# Patient Record
Sex: Female | Born: 1935
Health system: Southern US, Community
[De-identification: ages and names within clinical notes are randomized; demographics above are authoritative.]

## PROBLEM LIST (undated history)

## (undated) DIAGNOSIS — I714 Abdominal aortic aneurysm, without rupture, unspecified: Secondary | ICD-10-CM

## (undated) DIAGNOSIS — F419 Anxiety disorder, unspecified: Secondary | ICD-10-CM

## (undated) DIAGNOSIS — I5022 Chronic systolic (congestive) heart failure: Secondary | ICD-10-CM

## (undated) DIAGNOSIS — M1990 Unspecified osteoarthritis, unspecified site: Secondary | ICD-10-CM

## (undated) DIAGNOSIS — N183 Chronic kidney disease, stage 3 unspecified: Secondary | ICD-10-CM

## (undated) DIAGNOSIS — E079 Disorder of thyroid, unspecified: Secondary | ICD-10-CM

## (undated) DIAGNOSIS — I2510 Atherosclerotic heart disease of native coronary artery without angina pectoris: Secondary | ICD-10-CM

## (undated) DIAGNOSIS — K5792 Diverticulitis of intestine, part unspecified, without perforation or abscess without bleeding: Secondary | ICD-10-CM

## (undated) DIAGNOSIS — F32A Depression, unspecified: Secondary | ICD-10-CM

## (undated) DIAGNOSIS — I219 Acute myocardial infarction, unspecified: Secondary | ICD-10-CM

## (undated) DIAGNOSIS — K922 Gastrointestinal hemorrhage, unspecified: Secondary | ICD-10-CM

## (undated) DIAGNOSIS — E785 Hyperlipidemia, unspecified: Secondary | ICD-10-CM

## (undated) DIAGNOSIS — I10 Essential (primary) hypertension: Secondary | ICD-10-CM

## (undated) DIAGNOSIS — F329 Major depressive disorder, single episode, unspecified: Secondary | ICD-10-CM

## (undated) DIAGNOSIS — K219 Gastro-esophageal reflux disease without esophagitis: Secondary | ICD-10-CM

## (undated) DIAGNOSIS — Z8669 Personal history of other diseases of the nervous system and sense organs: Secondary | ICD-10-CM

## (undated) DIAGNOSIS — Z87440 Personal history of urinary (tract) infections: Secondary | ICD-10-CM

## (undated) DIAGNOSIS — N200 Calculus of kidney: Secondary | ICD-10-CM

## (undated) DIAGNOSIS — F0390 Unspecified dementia without behavioral disturbance: Secondary | ICD-10-CM

## (undated) HISTORY — DX: Chronic kidney disease, stage 3 unspecified: N18.30

## (undated) HISTORY — DX: Atherosclerotic heart disease of native coronary artery without angina pectoris: I25.10

## (undated) HISTORY — DX: Gastrointestinal hemorrhage, unspecified: K92.2

## (undated) HISTORY — DX: Gastro-esophageal reflux disease without esophagitis: K21.9

## (undated) HISTORY — PX: PARATHYROIDECTOMY: SHX19

## (undated) HISTORY — DX: Disorder of thyroid, unspecified: E07.9

## (undated) HISTORY — DX: Abdominal aortic aneurysm, without rupture: I71.4

## (undated) HISTORY — DX: Anxiety disorder, unspecified: F41.9

## (undated) HISTORY — DX: Personal history of urinary (tract) infections: Z87.440

## (undated) HISTORY — DX: Abdominal aortic aneurysm, without rupture, unspecified: I71.40

## (undated) HISTORY — DX: Unspecified osteoarthritis, unspecified site: M19.90

## (undated) HISTORY — DX: Major depressive disorder, single episode, unspecified: F32.9

## (undated) HISTORY — PX: ABDOMINAL AORTIC ANEURYSM REPAIR: SUR1152

## (undated) HISTORY — DX: Chronic systolic (congestive) heart failure: I50.22

## (undated) HISTORY — PX: CHOLECYSTECTOMY: SHX55

## (undated) HISTORY — DX: Hyperlipidemia, unspecified: E78.5

## (undated) HISTORY — PX: ABDOMINAL AORTIC ENDOVASCULAR STENT GRAFT: SHX5707

## (undated) HISTORY — DX: Depression, unspecified: F32.A

## (undated) HISTORY — DX: Acute myocardial infarction, unspecified: I21.9

## (undated) HISTORY — DX: Chronic kidney disease, stage 3 (moderate): N18.3

## (undated) HISTORY — DX: Calculus of kidney: N20.0

## (undated) HISTORY — DX: Essential (primary) hypertension: I10

## (undated) HISTORY — PX: THYROIDECTOMY: SHX17

## (undated) HISTORY — PX: CARDIAC CATHETERIZATION: SHX172

## (undated) HISTORY — DX: Personal history of other diseases of the nervous system and sense organs: Z86.69

## (undated) HISTORY — DX: Diverticulitis of intestine, part unspecified, without perforation or abscess without bleeding: K57.92

---

## 2004-10-04 ENCOUNTER — Ambulatory Visit: Payer: Self-pay | Admitting: Gastroenterology

## 2005-07-24 ENCOUNTER — Ambulatory Visit: Payer: Self-pay | Admitting: Family Medicine

## 2006-07-25 ENCOUNTER — Ambulatory Visit: Payer: Self-pay | Admitting: Family Medicine

## 2006-11-21 ENCOUNTER — Ambulatory Visit: Payer: Self-pay

## 2007-07-31 ENCOUNTER — Ambulatory Visit: Payer: Self-pay | Admitting: Family Medicine

## 2008-01-03 ENCOUNTER — Emergency Department: Payer: Self-pay | Admitting: Emergency Medicine

## 2008-08-26 ENCOUNTER — Emergency Department: Payer: Self-pay

## 2008-09-07 ENCOUNTER — Ambulatory Visit: Payer: Self-pay | Admitting: Family Medicine

## 2008-10-01 HISTORY — PX: LAPAROSCOPIC PARTIAL NEPHRECTOMY: SUR782

## 2008-10-01 HISTORY — PX: NEPHRECTOMY: SHX65

## 2008-11-17 ENCOUNTER — Ambulatory Visit: Payer: Self-pay | Admitting: Vascular Surgery

## 2008-11-24 ENCOUNTER — Inpatient Hospital Stay: Payer: Self-pay | Admitting: Vascular Surgery

## 2008-12-03 ENCOUNTER — Emergency Department: Payer: Self-pay | Admitting: Emergency Medicine

## 2009-04-01 ENCOUNTER — Ambulatory Visit: Payer: Self-pay | Admitting: Urology

## 2009-04-26 ENCOUNTER — Ambulatory Visit: Payer: Self-pay | Admitting: Urology

## 2009-07-07 ENCOUNTER — Ambulatory Visit: Payer: Self-pay | Admitting: Family Medicine

## 2009-09-13 ENCOUNTER — Ambulatory Visit: Payer: Self-pay | Admitting: Family Medicine

## 2009-10-04 ENCOUNTER — Ambulatory Visit: Payer: Self-pay | Admitting: Urology

## 2009-12-10 ENCOUNTER — Emergency Department: Payer: Self-pay | Admitting: Emergency Medicine

## 2009-12-14 ENCOUNTER — Encounter: Payer: Self-pay | Admitting: Cardiovascular Disease

## 2010-06-26 ENCOUNTER — Ambulatory Visit: Payer: Self-pay | Admitting: General Practice

## 2010-07-01 HISTORY — PX: SHOULDER SURGERY: SHX246

## 2010-07-12 ENCOUNTER — Inpatient Hospital Stay: Payer: Self-pay | Admitting: General Practice

## 2010-07-14 LAB — PATHOLOGY REPORT

## 2010-09-14 ENCOUNTER — Ambulatory Visit: Payer: Self-pay | Admitting: Internal Medicine

## 2010-09-17 ENCOUNTER — Inpatient Hospital Stay: Payer: Self-pay | Admitting: Internal Medicine

## 2010-09-18 ENCOUNTER — Inpatient Hospital Stay (HOSPITAL_COMMUNITY)
Admission: AD | Admit: 2010-09-18 | Discharge: 2010-09-23 | Payer: Self-pay | Source: Home / Self Care | Attending: Cardiology | Admitting: Cardiology

## 2010-09-18 ENCOUNTER — Encounter: Payer: Self-pay | Admitting: Cardiovascular Disease

## 2010-09-18 HISTORY — PX: CORONARY STENT PLACEMENT: SHX1402

## 2010-09-18 HISTORY — PX: CARDIAC CATHETERIZATION: SHX172

## 2010-09-19 ENCOUNTER — Encounter: Payer: Self-pay | Admitting: Cardiovascular Disease

## 2010-09-27 ENCOUNTER — Inpatient Hospital Stay: Payer: Self-pay | Admitting: Specialist

## 2010-09-28 ENCOUNTER — Encounter: Payer: Self-pay | Admitting: Cardiovascular Disease

## 2010-10-04 ENCOUNTER — Observation Stay: Payer: Self-pay | Admitting: Gastroenterology

## 2010-10-05 ENCOUNTER — Encounter: Payer: Self-pay | Admitting: Cardiovascular Disease

## 2010-10-05 ENCOUNTER — Inpatient Hospital Stay: Payer: Self-pay | Admitting: Gastroenterology

## 2010-10-06 ENCOUNTER — Encounter: Payer: Self-pay | Admitting: Cardiovascular Disease

## 2010-10-09 ENCOUNTER — Ambulatory Visit: Admit: 2010-10-09 | Payer: Self-pay | Admitting: Cardiovascular Disease

## 2010-10-16 ENCOUNTER — Encounter: Payer: Self-pay | Admitting: Cardiovascular Disease

## 2010-10-27 ENCOUNTER — Telehealth: Payer: Self-pay | Admitting: Cardiovascular Disease

## 2010-11-02 ENCOUNTER — Observation Stay: Payer: Self-pay | Admitting: Gastroenterology

## 2010-11-02 NOTE — Progress Notes (Signed)
Summary: Questions about Plavix  Phone Note Call from Patient Call back at Home Phone [phone]   Caller: Self Call For: Gollan Summary of Call: Pt would like to know if she is to continue w/ Plavix.  She was discharged from the hospital w/ it and it stated to take for 30 days.  It has been 30 days. Initial call taken by: Harlon Flor,  October 27, 2010 8:40 AM  Follow-up for Phone Call        Notified patient discharge summary did state at least one month staying on the plavix but likely for 3-6 months.  Told patient to stay on the plavix and this will be discussed at her follow up appt. in Feb. 2012.  She understands the reason why she should stay on plavix and will have medication refilled.   Follow-up by: Bishop Dublin, CMA,  October 27, 2010 8:58 AM

## 2010-11-02 NOTE — Miscellaneous (Signed)
Summary: [facility]  [facility]   Imported By: Harlon Flor 10/17/2010 11:30:54  _____________________________________________________________________  External Attachment:    Type:   Image     Comment:   External Document

## 2010-11-02 NOTE — Letter (Signed)
Summary: Discharge Summary  Discharge Summary   Imported By: Lysbeth Galas CMA 10/03/2010 12:04:13  _____________________________________________________________________  External Attachment:    Type:   Image     Comment:   External Document

## 2010-11-07 ENCOUNTER — Encounter: Payer: Self-pay | Admitting: Cardiovascular Disease

## 2010-11-08 ENCOUNTER — Encounter: Payer: Self-pay | Admitting: Cardiovascular Disease

## 2010-11-08 ENCOUNTER — Ambulatory Visit (INDEPENDENT_AMBULATORY_CARE_PROVIDER_SITE_OTHER): Payer: Medicare Other | Admitting: Cardiovascular Disease

## 2010-11-08 DIAGNOSIS — K922 Gastrointestinal hemorrhage, unspecified: Secondary | ICD-10-CM | POA: Insufficient documentation

## 2010-11-08 DIAGNOSIS — I2510 Atherosclerotic heart disease of native coronary artery without angina pectoris: Secondary | ICD-10-CM

## 2010-11-08 DIAGNOSIS — E7800 Pure hypercholesterolemia, unspecified: Secondary | ICD-10-CM | POA: Insufficient documentation

## 2010-11-08 DIAGNOSIS — D649 Anemia, unspecified: Secondary | ICD-10-CM

## 2010-11-08 DIAGNOSIS — I6529 Occlusion and stenosis of unspecified carotid artery: Secondary | ICD-10-CM | POA: Insufficient documentation

## 2010-11-08 DIAGNOSIS — E785 Hyperlipidemia, unspecified: Secondary | ICD-10-CM

## 2010-11-09 ENCOUNTER — Encounter: Payer: Self-pay | Admitting: Cardiovascular Disease

## 2010-11-09 DIAGNOSIS — R0989 Other specified symptoms and signs involving the circulatory and respiratory systems: Secondary | ICD-10-CM | POA: Insufficient documentation

## 2010-11-16 NOTE — Miscellaneous (Signed)
Summary: Orders Update  Clinical Lists Changes  Problems: Added new problem of CAROTID BRUIT, LEFT (ICD-785.9) Orders: Added new Test order of Carotid Duplex (Carotid Duplex) - Signed   Patient being followed by Dr. Wyn Quaker for known AAA; per Dr. Mariah Milling send order to Dr. Wyn Quaker for carotid.

## 2010-11-16 NOTE — Assessment & Plan Note (Addendum)
Summary: NP/F/U Karen Collins/ALSO WENT TO [facility] S/P STENT PLACEMENT FOR B...   Visit Type:  Initial Consult Primary Provider:  Dr. Fidela Collins  CC:  F/U Karen Collins.  c/o shortness of breath.  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 She presents for routine followup.  She was seen by myself in the hospital. We had to decrease her aspirin to 81 mg with Plavix and she continued to have bleeding and readmission. Last week, she was anemic after an additional bleed and her aspirin was discontinued. She is scheduled to have followup with Dr. Ricki Collins for lab work next week.  She denies any chest pain, shortness of breath, edema. She does feel somewhat tired. She does have also a history of depression.  She is concerned about the residual 70-80% disease in her RCA mid region on the catheterization report but denies any symptoms.  EKG shows normal sinus rhythm with rate 87 beats per minute, old anterolateral infarct with inferior Q waves on EKG, T-wave abnormality in lead V6  Preventive Screening-Counseling & Management  Alcohol-Tobacco     Smoking Status: quit  Caffeine-Diet-Exercise     Does Patient Exercise: yes  Current Medications (verified): 1)  Plavix 75 Mg Tabs (Clopidogrel Bisulfate) .Marland Kitchen.. 1 Tablet Daily 2)  Iron Complex 150mg  .... 1 Tablet Daily 3)  Metoprolol Tartrate 25 Mg  Tabs (Metoprolol Tartrate) .... 1/2 Tablet Two Times A Day 4)  Nitrostat 0.4 Mg Subl (Nitroglycerin) .... As Needed 5)  Pantoprazole Sodium 40 Mg Tbec (Pantoprazole Sodium) .Marland Kitchen.. 1 Tablet Daily 6)  Simvastatin 40 Mg Tabs (Simvastatin) .Marland Kitchen.. 1 Tablet Daily 7)  Lorazepam 1 Mg Tabs (Lorazepam) .Marland Kitchen.. 1 Tablet Every 4 Hours As Needed 8)  Multivitamins  Tabs (Multiple Vitamin) .Marland Kitchen.. 1 Tablet Daily 9)  Potassium Chloride Cr 10 Meq Cr-Tabs (Potassium Chloride) .Marland Kitchen.. 1 Tablet Daily 10)  Triamterene-Hctz 37.5-25 Mg Tabs (Triamterene-Hctz) .Marland Kitchen.. 1 Tablet Daily 11)  Tylenol Extra Strength 500 Mg Tabs (Acetaminophen) .Marland Kitchen.. 1-2 Tablets Every 6 Hours As Needed 12)  Venlafaxine Hcl 75 Mg Tabs (Venlafaxine Hcl) .Marland Kitchen.. 1 Tablet Daily 13)  Alertab 25 Mg Tabs (Diphenhydramine Hcl) .... One Tablet Once Daily As Needed 14)  Ferrex 150 150 Mg Caps (Polysaccharide Iron Complex) .... One Tablet Two Times A Day 15)  Benadryl 25 Mg Tabs (Diphenhydramine Hcl) .... As Needed 16)  Fish Oil 1000 Mg Caps (Omega-3 Fatty Acids) .... One Tablet Once Daily 17)  Vitamin E 200 Unit Caps (Vitamin E) .... One Tablet Once Daily 18)  Vitamin D 1000 Unit Tabs (Cholecalciferol) .... One Tablet Once Daily 19)  Calcium 600 Mg Tabs (Calcium) .... One Tablet Once Daily 20)  Ambien 10 Mg Tabs (Zolpidem Tartrate) .... As Needed For Sleep 21)  Effexor Xr 75 Mg Xr24h-Cap (Venlafaxine Hcl) .... One Tablet Once Daily  Allergies (verified): 1)  ! * Tramadol  Past History:  Family History: Last updated: 11/08/2010 Father:  deceased age 75 Mother:living age 79; arthritis  Social History: Last updated: 11/08/2010 Retired  Divorced  Tobacco Use - Former.  Smoked x 30 yrs. < 1ppd.  Quit 2007. Alcohol Use - yes-- wine 2-3 x weekly. Regular Exercise - yes--walks occas. with house work.  Risk Factors: Exercise: yes (11/08/2010)  Risk Factors: Smoking Status: quit (11/08/2010)  Past Medical History: Hyperlipidemia Hypertension AAA CAD;  MI  Past Surgical History: Heart Cath-09/18/2010 CAD; MI with s/p stent Dec. 19, 2011 s/p stent; AAA 2010 Dr. Wyn Collins @ [facility] right nephrectomy Jan. 2010 right shoulder surgery Oct. 2011  Family History: Father: deceased age 31 Mother:living age 27; arthritis  Social History: Retired  Divorced  Tobacco Use - Former.  Smoked x 30 yrs. < 1ppd.  Quit 2007. Alcohol Use - yes-- wine 2-3 x weekly. Regular Exercise - yes--walks occas. with house work. Smoking Status:  quit Does Patient Exercise:  yes  Review of Systems  The patient denies fever, weight loss, weight gain, vision loss, decreased hearing, hoarseness, chest pain, syncope, dyspnea on exertion, peripheral edema, prolonged cough, abdominal pain, incontinence, muscle weakness, depression, and enlarged lymph nodes.         Tired, GI bleeding  Vital Signs:  Patient profile:   77 year old female Height:      63 inches Weight:      149 pounds BMI:     26.49 Pulse rate:   87 / minute BP sitting:   112 / 69  (left arm)  Vitals Entered By: Karen Collins, CMA (November 08, 2010 10:52 AM)  Physical Exam  General:  Well developed, well nourished, in no acute distress. Head:  normocephalic and atraumatic Neck:  Neck supple, no JVD. No masses, thyromegaly or abnormal cervical nodes. Lungs:  Clear bilaterally to auscultation and percussion. Heart:  Non-displaced PMI, chest non-tender; regular rate and rhythm, S1, S2 with I-II/VI SEM RSB, no rubs or gallops. Carotid upstroke normal, 1+ bruit on the left. Normal abdominal aortic size, no bruits. Femorals normal pulses, no bruits. Pedals normal pulses. No edema, no varicosities. Abdomen:  Bowel sounds positive; abdomen soft and non-tender without masses Msk:  Back normal, normal gait. Muscle strength and tone normal. Pulses:  pulses normal in all 4 extremities Extremities:  No clubbing or cyanosis. Neurologic:  Alert and oriented x 3. Skin:  Intact without lesions or rashes. Psych:   Normal affect.   Impression & Recommendations:  Problem # 1:  CAD (ICD-414.00) she does have residual 70-80% RCA disease. Uncertain if she has symptoms as she has not been very active given her recent hospitalizations for GI bleeding. We have ordered a kexiscan Myoview. If there is no ischemia, there will be no rush to perform a colonoscopy and resolve her GI bleeding issues.  If she has ischemia in the RCA territory, we will have to discuss this with her primary physicians and determine the best course of action.  The following medications were removed from the medication list:    Aspir-low 81 Mg Tbec (Aspirin) .Marland Kitchen... 1 tablet daily Her updated medication list for this problem includes:    Plavix 75 Mg Tabs (Clopidogrel bisulfate) .Marland Kitchen... 1 tablet daily    Metoprolol Tartrate 25 Mg Tabs (Metoprolol tartrate) .Marland Kitchen... 1/2 tablet two times a day    Nitrostat 0.4 Mg Subl (Nitroglycerin) .Marland Kitchen... As needed  Orders: EKG w/ Interpretation (93000) Nuclear Stress Test (Nuc Stress Test)  Problem # 2:  HYPERLIPIDEMIA-MIXED (ICD-272.4) Goal LDL is less than 70. We'll try  to obtain her most recent lipid panel from Dr. Randa Lynn.  Her updated medication list for this problem includes:    Simvastatin 40 Mg Tabs (Simvastatin) .Marland Kitchen... 1 tablet daily  Problem # 3:  GI BLEEDING (ICD-578.9) She has continued to have GI bleeding and now is only taking Plavix with no aspirin. Repeat blood work scheduled for next week. If she continues to drop her blood count, we will need to determine the best course of action and at what point to discontinue her Plavix. Ideally we would like 90 days, currently she is at 7 weeks.  Problem # 4:  CAROTID ARTERY STENOSIS, WITHOUT INFARCTION (ICD-433.10) she does have a bruit of her left carotid, known AAA. I stressed that she had a carotid ultrasound. She has followup with Dr. Wyn Collins. We will continue aggressive medical management.  The following medications were removed from the  medication list:    Aspir-low 81 Mg Tbec (Aspirin) .Marland Kitchen... 1 tablet daily Her updated medication list for this problem includes:    Plavix 75 Mg Tabs (Clopidogrel bisulfate) .Marland Kitchen... 1 tablet daily  Patient Instructions: 1)  Your physician recommends that you schedule a follow-up appointment in: 3 months 2)  Your physician has requested that you have a Lexiscan myoview.  For further information please visit [email or web address]  Please follow instruction sheet, as given. You were given instrucations today at  your office visit.  Appended Document: NP/F/U [place]/ALSO WENT TO [facility] S/P STENT PLACEMENT FOR B... Correction to previous note: Patient does have shortness of breath. uncertain if this is secondary to underlying coronary artery disease and ischemia or other etiology.

## 2010-11-22 ENCOUNTER — Telehealth: Payer: Self-pay | Admitting: Cardiovascular Disease

## 2010-11-24 ENCOUNTER — Telehealth (INDEPENDENT_AMBULATORY_CARE_PROVIDER_SITE_OTHER): Payer: Self-pay | Admitting: *Deleted

## 2010-11-27 ENCOUNTER — Encounter: Payer: Self-pay | Admitting: Internal Medicine

## 2010-11-27 ENCOUNTER — Ambulatory Visit: Payer: Medicare Other | Admitting: Cardiology

## 2010-11-27 ENCOUNTER — Encounter: Payer: Self-pay | Admitting: Cardiovascular Disease

## 2010-11-27 ENCOUNTER — Ambulatory Visit (HOSPITAL_COMMUNITY): Payer: Medicare Other | Attending: Cardiovascular Disease

## 2010-11-27 ENCOUNTER — Ambulatory Visit (INDEPENDENT_AMBULATORY_CARE_PROVIDER_SITE_OTHER): Payer: Medicare Other | Admitting: Cardiology

## 2010-11-27 ENCOUNTER — Encounter: Payer: Self-pay | Admitting: Cardiology

## 2010-11-27 DIAGNOSIS — R0609 Other forms of dyspnea: Secondary | ICD-10-CM

## 2010-11-27 DIAGNOSIS — I429 Cardiomyopathy, unspecified: Secondary | ICD-10-CM | POA: Insufficient documentation

## 2010-11-27 DIAGNOSIS — I4949 Other premature depolarization: Secondary | ICD-10-CM

## 2010-11-27 DIAGNOSIS — I2589 Other forms of chronic ischemic heart disease: Secondary | ICD-10-CM

## 2010-11-27 DIAGNOSIS — I2510 Atherosclerotic heart disease of native coronary artery without angina pectoris: Secondary | ICD-10-CM

## 2010-11-27 DIAGNOSIS — R0602 Shortness of breath: Secondary | ICD-10-CM

## 2010-11-28 NOTE — Progress Notes (Signed)
Summary: Labwork  Phone Note Call from Patient Call back at [facility] Phone [phone]   Caller: Self Call For: Karen Collins Summary of Call: Pt just had labs that showed hemoglodin at 9.7.  Should the pt still have a lexiscan next week? Initial call taken by: Harlon Flor,  November 22, 2010 12:45 PM  Follow-up for Phone Call        Pt states she just had this bloodwork done, h/o anemia. Does she need bloodwork prior to lexiscan or can we proceed? Her Lexi is scheduled for Monday 11/27/10. Please advise. Follow-up by: Lanny Hurst RN,  November 22, 2010 4:36 PM  Additional Follow-up for Phone Call Additional follow up Details #1::        ok to proceed with lexiscan. should be no problem     Appended Document: Labwork notified patient ok to proceed with lexiscan, should be no problem.

## 2010-11-28 NOTE — Progress Notes (Signed)
Summary: Nuclear Pre-Procedure  Phone Note Outgoing Call Call back at [facility] Phone [phone]   Call placed by: Stanton Kidney, EMT-P,  November 24, 2010 1:08 PM Summary of Call:  attempted  to leave message with information on Myoview Information Sheet (see scanned document for details); number remained busy after several attempts. Stanton Kidney, EMT-P  November 24, 2010 1:09 PM      Nuclear Med Background Indications for Stress Test: Evaluation for Ischemia, Stent Patency   History: Echo, Heart Catheterization, Myocardial Infarction, Stents  History Comments: 12/11 MI > CFx stent 12/11 Echo: EF=60-65%  Symptoms: Fatigue, SOB    Nuclear Pre-Procedure Cardiac Risk Factors: History of Smoking, Hypertension, Lipids Height (in): 63

## 2010-12-07 ENCOUNTER — Other Ambulatory Visit: Payer: Self-pay | Admitting: Gastroenterology

## 2010-12-07 NOTE — Assessment & Plan Note (Signed)
Summary: discuss myoview/dm   Primary Provider:  Dr. Fidela Collins   History of Present Illness: 77 year old female with a past medical history of coronary artery disease, recent MI with occlusion of her left circumflex on December 19 with transfer from 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 Patient was scheduled for a Myoview today to evaluate the residual right coronary lesion. She had poor exercise tolerance and dyspnea but no chest pain. There were no electrocardiographic changes. Review of her images showed a large prior anterolateral infarct but no ischemia. Ejection fraction was 36%. Because of her abnormal study I was asked to further evaluate. She does have some dyspnea on exertion but no orthopnea, PND, pedal edema, palpitations, syncope and she has not had any chest tightness similar to her infarct pain.  Current Medications (verified): 1)  Plavix 75 Mg Tabs (Clopidogrel Bisulfate) .Marland Kitchen.. 1 Tablet Daily 2)  Iron Complex 150mg  .... 2  Tablets Daily 3)  Nitrostat 0.4 Mg Subl (Nitroglycerin) .... As Needed 4)  Pantoprazole Sodium 40 Mg Tbec (Pantoprazole Sodium) .Marland Kitchen.. 1 Tablet Daily 5)  Simvastatin 40 Mg Tabs (Simvastatin) .Marland Kitchen.. 1 Tablet Daily 6)  Lorazepam 1 Mg Tabs (Lorazepam) .Marland Kitchen.. 1 Tablet Every 4 Hours As Needed 7)  Multivitamins  Tabs (Multiple Vitamin) .Marland Kitchen.. 1 Tablet Daily 8)  Potassium Chloride Cr 10 Meq Cr-Tabs (Potassium Chloride) .Marland Kitchen.. 1 Tablet Daily 9)  Triamterene-Hctz 37.5-25 Mg Tabs (Triamterene-Hctz) .Marland Kitchen.. 1 Tablet Daily 10)  Tylenol Extra Strength 500 Mg Tabs (Acetaminophen) .Marland Kitchen.. 1-2 Tablets Every 6 Hours As  Needed 11)  Venlafaxine Hcl 75 Mg Tabs (Venlafaxine Hcl) .Marland Kitchen.. 1 Tablet Daily 12)  Alertab 25 Mg Tabs (Diphenhydramine Hcl) .... One Tablet Once Daily As Needed 13)  Ferrex 150 150 Mg Caps (Polysaccharide Iron Complex) .... One Tablet Two Times A Day 14)  Benadryl 25 Mg Tabs (Diphenhydramine Hcl) .... As Needed 15)  Fish Oil 1000 Mg Caps (Omega-3 Fatty Acids) .... One Tablet Once Daily 16)  Vitamin E 200 Unit Caps (Vitamin E) .... One Tablet Once Daily 17)  Vitamin D 1000 Unit Tabs (Cholecalciferol) .... One Tablet Once Daily 18)  Calcium 600 Mg Tabs (Calcium) .... One Tablet Once Daily 19)  Ambien 10 Mg Tabs (Zolpidem Tartrate) .... As Needed For Sleep 20)  Effexor Xr 75 Mg Xr24h-Cap (Venlafaxine Hcl) .... One Tablet Once Daily  Allergies: 1)  ! * Tramadol  Past History:  Past Medical History: Hyperlipidemia Hypertension AAA CAD; MI GI bleed Renal cell carcinoma  Past Surgical History: Reviewed history from 11/08/2010 and no changes required. Heart Cath-09/18/2010 CAD; MI with s/p stent Dec. 19, 2011 s/p stent; AAA 2010 Dr. Wyn Collins @ [facility] right nephrectomy Jan. 2010 right shoulder surgery Oct. 2011  Social History: Reviewed history from 11/08/2010 and no changes required. Retired  Divorced  Tobacco Use - Former.  Smoked x 30 yrs. < 1ppd.  Quit 2007. Alcohol Use - yes-- wine 2-3 x weekly. Regular Exercise - yes--walks occas. with house work.  Review of Systems       Anxiety  but no fevers or chills, productive cough, hemoptysis, dysphasia, odynophagia, melena, hematochezia, dysuria, hematuria, rash, seizure activity,  orthopnea, PND, pedal edema, claudication. Remaining systems are negative.   Vital Signs:  Patient profile:   77 year old female Height:      63 inches Weight:      149 pounds BMI:     26.49 Pulse rate:   86 / minute Resp:     14 per minute BP sitting:   129 / 92  (left arm)  Vitals Entered By: Karen Collins (November 27, 2010 4:40 PM)  Physical  Exam  General:  Well-developed well-nourished in no acute distress.  Skin is warm and dry.  HEENT is normal.  Neck is supple. No thyromegaly.  Chest is clear to auscultation with normal expansion.  Cardiovascular exam is regular rate and rhythm.  Abdominal exam nontender or distended. No masses palpated. Extremities show no edema. neuro grossly intact    Impression & Recommendations:  Problem # 1:  ISCHEMIC CARDIOMYOPATHY (ICD-414.8) Patient has an ejection fraction of 36% on her Myoview. She has infarct but no ischemia. She has had no recurrent chest pain. Plan continue Plavix and statin. She is not taking her metoprolol. I will discontinue this medication. Begin Coreg 3.125 mg p.o. b.i.d. Begin lisinopril 2.5 mg p.o. daily. Discontinue potassium. Check potassium and renal function in one week. Titrate medications as tolerated by pulse and blood pressure in followup with Dr. Mariah Collins. Repeat echocardiogram or cardiac MR in 3 months. If ejection fraction less than or equal to 35% consider ICD. The following medications were removed from the medication list:    Metoprolol Tartrate 25 Mg Tabs (Metoprolol tartrate) .Marland Kitchen... 1/2 tablet two times a day Her updated medication list for this problem includes:    Plavix 75 Mg Tabs (Clopidogrel bisulfate) .Marland Kitchen... 1 tablet daily    Nitrostat 0.4 Mg Subl (Nitroglycerin) .Marland Kitchen... As needed    Triamterene-hctz 37.5-25 Mg Tabs (Triamterene-hctz) .Marland Kitchen... 1 tablet daily    Lisinopril 2.5 Mg Tabs (Lisinopril) .Marland Kitchen... Take one tablet by mouth daily    Carvedilol 3.125 Mg Tabs (Carvedilol) .Marland Kitchen... Take one tablet by mouth twice a day  Problem # 2:  HYPERLIPIDEMIA-MIXED (ICD-272.4) Continue statin. Her updated medication list for this problem includes:    Simvastatin 40 Mg Tabs (Simvastatin) .Marland Kitchen... 1 tablet daily  Problem # 3:  CAD (ICD-414.00) Continue Plavix and statin. Add ACE inhibitor and beta blocker. She has been taken off of her aspirin secondary to recent GI  blood loss. The following medications were removed from the medication list:    Metoprolol Tartrate 25 Mg Tabs (Metoprolol tartrate) .Marland Kitchen... 1/2 tablet two times a day Her updated medication list for this problem includes:    Plavix 75 Mg Tabs (Clopidogrel bisulfate) .Marland Kitchen... 1 tablet daily    Nitrostat 0.4 Mg Subl (Nitroglycerin) .Marland Kitchen... As needed    Lisinopril 2.5 Mg Tabs (Lisinopril) .Marland Kitchen... Take one tablet by mouth daily    Carvedilol 3.125 Mg Tabs (Carvedilol) .Marland Kitchen... Take one tablet by mouth twice a day  Problem # 4:  CAROTID BRUIT, LEFT (ICD-785.9) Followup carotid Dopplers with Dr. Mariah Collins.  Patient Instructions: 1)  Your physician recommends that you schedule a follow-up appointment in: ONE WEEK WITH DR Karen Collins 2)  Your physician recommends that you return for lab work in:ONE WEEK AT [facility] 3)  Your physician has recommended you make the following change in your medication: STOP POTASSIUM 4)  STOP METOPROLOL 5)  START LISINOPRIL 2.5MG  ONE TABLET ONCE DAILY 6)  START CARVEDALOL 3.125MG  ONE TABLETY TWICE DAILY Prescriptions: CARVEDILOL 3.125 MG TABS (CARVEDILOL) Take one tablet  by mouth twice a day  #60 x 12   Entered by:   Deliah Goody, RN   Authorized by:   Ferman Hamming, MD, [facility]   Signed by:   Deliah Goody, RN on 11/27/2010   Method used:   Electronically to        [facility] [phone]* (retail)       [address] [place], [place]  [address]       Ph: [phone]       Fax: [phone]   RxID:   [id number] LISINOPRIL 2.5 MG TABS (LISINOPRIL) Take one tablet by mouth daily  #30 x 12   Entered by:   Deliah Goody, RN   Authorized by:   Ferman Hamming, MD, [facility]   Signed by:   Deliah Goody, RN on 11/27/2010   Method used:   Electronically to        [facility] [phone]* (retail)       [address] [place], [place]  [address]       Ph: [phone]       Fax: [phone]   RxID:   [id number]

## 2010-12-07 NOTE — Assessment & Plan Note (Signed)
Summary: Cardiology Nuclear Testing  Nuclear Med Background Indications for Stress Test: Evaluation for Ischemia, Stent Patency   History: Echo, Heart Catheterization, Myocardial Infarction, Stents  History Comments: 12/11 MI > CFx stent 12/11 Echo: EF=60-65%  Symptoms: DOE, Fatigue, Palpitations, SOB    Nuclear Pre-Procedure Cardiac Risk Factors: History of Smoking, Hypertension, Lipids Caffeine/Decaff Intake: 7AM NPO After: 7:00 AM Lungs: clear IV 0.9% NS with Angio Cath: 22g     IV Site: R Antecubital IV Started by: Irean Hong, RN Chest Size (in) 38     Cup Size D     Height (in): 63 Weight (lb): 144 BMI: 25.60 Tech Comments: Last metoprolol 48 hrs. This patient came in for a exercise stress Myoview. When the patient started on the treadmill she had extreme fatigue, and sob. She had to be held up to make it to 3:00. DOD B.Crenshaw was consulted and is going to see the patient. S.Williams EMTP  Nuclear Med Study 1 or 2 day study:  1 day     Stress Test Type:  Stress Reading MD:  Dietrich Pates, MD     Referring MD:  T.Gollan Resting Radionuclide:  Technetium 21m Tetrofosmin     Resting Radionuclide Dose:  10.7 mCi  Stress Radionuclide:  Technetium 15m Tetrofosmin     Stress Radionuclide Dose:  33 mCi   Stress Protocol Exercise Time (min):  3:00 min     Max HR:  136 bpm     Predicted Max HR:  146 bpm       Percent Max HR:  93.15 %     METS: 4.6    Stress Test Technologist:  Milana Na, EMT-P     Nuclear Technologist:  Doyne Keel, CNMT  Rest Procedure  Myocardial perfusion imaging was performed at rest 45 minutes following the intravenous administration of Technetium 82m Tetrofosmin.  Stress Procedure  The patient exercised for 3:00. The patient stopped due to extreme fatigue, sob, and denied any chest pain.  There were no significant ST-T wave changes and occ pvcs.  Technetium 20m Tetrofosmin was injected at peak exercise and myocardial perfusion imaging was  performed after a brief delay.  QPS Raw Data Images:  Soft tissue (diaphragm, bowel) underlies heart.  Rest images were motion corrected. Stress Images:  Large defect in the lateral wall (base, mid, distal); inferolateral wall (base,mid, distal), inferior wall (base,mid,distal) and apex. Rest Images:  No significant change from the stress images. Subtraction (SDS):  No evidence of ischemia. Transient Ischemic Dilatation:  0.81  (Normal <1.22)  Lung/Heart Ratio:  0.35  (Normal <0.45)  Quantitative Gated Spect Images QGS EDV:  136 ml QGS ESV:  86 ml QGS EF:  36 %   Overall Impression  Exercise Capacity: Poor exercise capacity. BP Response: Normal blood pressure response. Clinical Symptoms: Fatigue, no chest pain. ECG Impression: No significant ST segment change suggestive of ischemia. Overall Impression: Extensive scar in the inferior, inferolateral, lateral and apical walls.  No ischemia.  LVEF calculated at  36% with hypokinesis in mentioned areas.

## 2010-12-08 ENCOUNTER — Observation Stay: Payer: Self-pay | Admitting: Gastroenterology

## 2010-12-09 ENCOUNTER — Encounter: Payer: Self-pay | Admitting: Cardiovascular Disease

## 2010-12-11 LAB — BASIC METABOLIC PANEL
BUN: 11 mg/dL (ref 6–23)
BUN: 13 mg/dL (ref 6–23)
BUN: 18 mg/dL (ref 6–23)
CO2: 23 mEq/L (ref 19–32)
Calcium: 9.2 mg/dL (ref 8.4–10.5)
Chloride: 99 mEq/L (ref 96–112)
Creatinine, Ser: 1.19 mg/dL (ref 0.4–1.2)
Creatinine, Ser: 1.22 mg/dL — ABNORMAL HIGH (ref 0.4–1.2)
Creatinine, Ser: 1.34 mg/dL — ABNORMAL HIGH (ref 0.4–1.2)
GFR calc Af Amer: 47 mL/min — ABNORMAL LOW (ref >60)
GFR calc Af Amer: 53 mL/min — ABNORMAL LOW (ref >60)
GFR calc non Af Amer: 39 mL/min — ABNORMAL LOW (ref >60)
GFR calc non Af Amer: 43 mL/min — ABNORMAL LOW (ref >60)
GFR calc non Af Amer: 44 mL/min — ABNORMAL LOW (ref >60)
Glucose, Bld: 99 mg/dL (ref 70–99)
Potassium: 3.3 mEq/L — ABNORMAL LOW (ref 3.5–5.1)
Sodium: 135 mEq/L (ref 135–145)

## 2010-12-11 LAB — LIPASE, BLOOD: Lipase: 40 U/L (ref 11–59)

## 2010-12-11 LAB — CBC
HCT: 28 % — ABNORMAL LOW (ref 36.0–46.0)
MCH: 25.1 pg — ABNORMAL LOW (ref 26.0–34.0)
MCHC: 30.9 g/dL (ref 30.0–36.0)
MCV: 80.5 fL (ref 78.0–100.0)
MCV: 81.7 fL (ref 78.0–100.0)
Platelets: 259 10*3/uL (ref 150–400)
Platelets: 273 10*3/uL (ref 150–400)
Platelets: 277 10*3/uL (ref 150–400)
Platelets: 299 10*3/uL (ref 150–400)
Platelets: 310 10*3/uL (ref 150–400)
RBC: 3.31 MIL/uL — ABNORMAL LOW (ref 3.87–5.11)
RBC: 3.48 MIL/uL — ABNORMAL LOW (ref 3.87–5.11)
RBC: 3.5 MIL/uL — ABNORMAL LOW (ref 3.87–5.11)
RDW: 14 % (ref 11.5–15.5)
RDW: 14 % (ref 11.5–15.5)
RDW: 14.1 % (ref 11.5–15.5)
RDW: 14.2 % (ref 11.5–15.5)
WBC: 5.1 10*3/uL (ref 4.0–10.5)
WBC: 5.1 10*3/uL (ref 4.0–10.5)
WBC: 6.1 10*3/uL (ref 4.0–10.5)
WBC: 6.9 10*3/uL (ref 4.0–10.5)

## 2010-12-11 LAB — PLATELET INHIBITION P2Y12
P2Y12 % Inhibition: 49 %
Platelet Function  P2Y12: 178 [PRU] — ABNORMAL LOW (ref 194–418)
Platelet Function  P2Y12: 233 [PRU] (ref 194–418)
Platelet Function Baseline: 348 [PRU] (ref 194–418)
Platelet Function Baseline: 354 [PRU] (ref 194–418)

## 2010-12-11 LAB — CARDIAC PANEL(CRET KIN+CKTOT+MB+TROPI)
Relative Index: 12.8 — ABNORMAL HIGH (ref 0.0–2.5)
Relative Index: 16.5 — ABNORMAL HIGH (ref 0.0–2.5)
Total CK: 2281 U/L — ABNORMAL HIGH (ref 7–177)
Troponin I: 86.83 ng/mL (ref 0.00–0.06)
Troponin I: >100 ng/mL (ref 0.00–0.06)

## 2010-12-11 LAB — RETICULOCYTES
RBC.: 3.36 MIL/uL — ABNORMAL LOW (ref 3.87–5.11)
Retic Count, Absolute: 47 10*3/uL (ref 19.0–186.0)
Retic Ct Pct: 1.4 % (ref 0.4–3.1)

## 2010-12-11 LAB — HEMOCCULT GUIAC POC 1CARD (OFFICE): Fecal Occult Bld: POSITIVE

## 2010-12-11 LAB — VITAMIN B12: Vitamin B-12: 220 pg/mL (ref 211–911)

## 2010-12-11 LAB — IRON AND TIBC: Saturation Ratios: 4 % — ABNORMAL LOW (ref 20–55)

## 2010-12-11 LAB — TSH: TSH: 3.231 u[IU]/mL (ref 0.350–4.500)

## 2010-12-11 LAB — MRSA PCR SCREENING: MRSA by PCR: NEGATIVE

## 2010-12-12 NOTE — Consult Note (Signed)
SummaryScientist, physiological [facility]   [facility]   Imported By: Roderic Ovens 12/05/2010 14:58:34  _____________________________________________________________________  External Attachment:    Type:   Image     Comment:   External Document

## 2010-12-12 NOTE — Consult Note (Signed)
SummaryScientist, physiological [facility]   [facility]   Imported By: Roderic Ovens 12/05/2010 14:59:17  _____________________________________________________________________  External Attachment:    Type:   Image     Comment:   External Document

## 2010-12-13 ENCOUNTER — Encounter: Payer: Self-pay | Admitting: Cardiovascular Disease

## 2010-12-13 ENCOUNTER — Ambulatory Visit (INDEPENDENT_AMBULATORY_CARE_PROVIDER_SITE_OTHER): Payer: Medicare Other | Admitting: Cardiovascular Disease

## 2010-12-13 ENCOUNTER — Telehealth: Payer: Self-pay | Admitting: Cardiovascular Disease

## 2010-12-13 DIAGNOSIS — I739 Peripheral vascular disease, unspecified: Secondary | ICD-10-CM

## 2010-12-13 DIAGNOSIS — I2510 Atherosclerotic heart disease of native coronary artery without angina pectoris: Secondary | ICD-10-CM

## 2010-12-13 DIAGNOSIS — E785 Hyperlipidemia, unspecified: Secondary | ICD-10-CM

## 2010-12-19 NOTE — Progress Notes (Signed)
Summary: Colonoscopy  Phone Note Call from Patient Call back at Home Phone [phone]   Caller: Self Call For: Karen Collins Summary of Call: Dr Vilinda Blanks is scheduling a colonoscopy for Monday 3/26.  Does the pt need to discontinue Aspirin 81 mg before the procedure or should she continue it until the colonoscopy? Initial call taken by: Harlon Flor,  December 13, 2010 3:19 PM  Follow-up for Phone Call        Would continue ASA 81 daily, hold plavix. ASA should be ok through the cololonscopy     Appended Document: Colonoscopy notified patient ok to continue ASA but needs to stop plavix.  She understands and will follow these instructions.

## 2010-12-19 NOTE — Assessment & Plan Note (Signed)
Summary: F/U MYOVIEW/DM   Visit Type:  Follow-up Primary Provider:  Dr. 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  Her recent stress test showed a large region of scar in the inferior and inferolateral wall. Poor exercise tolerance.  She reports having blood transfusion x2 last week for persistent anemia. Aspirin was held a month ago and she has continued to have progressive anemia on Plavix. She is close to 3 months out from her prior stent in December of 2011.  EKG today shows normal sinus rhythm with rate 70 beats per minute no significant ST or T wave changes  Current Medications (verified): 1)  Plavix 75 Mg Tabs (Clopidogrel Bisulfate) .Marland Kitchen.. 1 Tablet Daily 2)  Iron Complex 150mg  .... 2  Tablets Daily 3)  Nitrostat 0.4 Mg Subl (Nitroglycerin) .... As Needed 4)  Pantoprazole Sodium 40 Mg Tbec (Pantoprazole Sodium) .Marland Kitchen.. 1 Tablet Daily 5)  Simvastatin 40 Mg Tabs (Simvastatin) .Marland Kitchen.. 1 Tablet Daily 6)  Lorazepam 1 Mg Tabs (Lorazepam) .Marland Kitchen.. 1 Tablet Every 4 Hours As Needed 7)  Multivitamins  Tabs (Multiple Vitamin) .Marland Kitchen.. 1 Tablet Daily 8)  Triamterene-Hctz 37.5-25 Mg Tabs (Triamterene-Hctz) .Marland Kitchen.. 1 Tablet Daily 9)  Tylenol Extra Strength 500 Mg Tabs (Acetaminophen) .Marland Kitchen.. 1-2 Tablets Every 6 Hours As Needed 10)  Venlafaxine Hcl 75 Mg Tabs (Venlafaxine Hcl) .Marland Kitchen.. 1 Tablet Daily 11)  Alertab 25 Mg Tabs  (Diphenhydramine Hcl) .... One Tablet Once Daily As Needed 12)  Ferrex 150 150 Mg Caps (Polysaccharide Iron Complex) .... One Tablet Two Times A Day 13)  Benadryl 25 Mg Tabs (Diphenhydramine Hcl) .... As Needed 14)  Fish Oil 1000 Mg Caps (Omega-3 Fatty Acids) .... One Tablet Once Daily 15)  Vitamin E 200 Unit Caps (Vitamin E) .... One Tablet Once Daily 16)  Vitamin D 1000 Unit Tabs (Cholecalciferol) .... One Tablet Once Daily 17)  Calcium 600 Mg Tabs (Calcium) .... One Tablet Once Daily 18)  Ambien 10 Mg Tabs (Zolpidem Tartrate) .... As Needed For Sleep 19)  Effexor Xr 75 Mg Xr24h-Cap (Venlafaxine Hcl) .... One Tablet Once Daily 20)  Lisinopril 2.5 Mg Tabs (Lisinopril) .... Take One Tablet By Mouth Daily 21)  Carvedilol 3.125 Mg Tabs (Carvedilol) .... Take One Tablet By Mouth Twice A Day  Allergies (verified): 1)  ! * Tramadol  Past History:  Past Medical History: Last updated: 11/27/2010 Hyperlipidemia Hypertension AAA CAD; MI GI bleed Renal cell carcinoma  Past Surgical History: Last updated: 12-06-10 Heart Cath-09/18/2010 CAD; MI with s/p stent Dec. 19, 2011 s/p stent; AAA 2010 Dr. Wyn Quaker [facility] right nephrectomy Jan. 2010 right shoulder surgery Oct. 2011  Family History: Last updated: 12-06-10 Father: deceased age 49 Mother:living age 63; arthritis  Social History: Last updated: 12-06-10 Retired  Divorced  Tobacco Use - Former.  Smoked x 30 yrs. < 1ppd.  Quit 2007. Alcohol Use - yes-- wine 2-3 x weekly. Regular Exercise - yes--walks occas. with house work.  Risk Factors: Exercise: yes (11/08/2010)  Risk Factors: Smoking Status: quit (11/08/2010)  Review of Systems  The patient denies fever, weight loss, weight gain, vision loss, decreased hearing, hoarseness, chest pain, syncope, dyspnea on exertion, peripheral edema, prolonged cough, abdominal pain, incontinence, muscle weakness, depression, and enlarged lymph nodes.         fatigue  Vital  Signs:  Patient profile:   77 year old female Height:      63 inches Weight:      149 pounds BMI:     26.49 Pulse rate:   79 / minute BP sitting:   120 / 86  (left arm) Cuff size:   regular  Vitals Entered By: Bishop Dublin, CMA (December 13, 2010 10:57 AM)  Physical Exam  General:  Well developed, well nourished, in no acute distress. Head:  normocephalic and atraumatic Neck:  Neck supple, no JVD. No masses, thyromegaly or abnormal cervical nodes. Lungs:  Clear bilaterally to auscultation and percussion. Heart:  Non-displaced PMI, chest non-tender; regular rate and rhythm, S1, S2 without murmurs, rubs or gallops. Carotid upstroke normal, no bruit. Pedals normal pulses. No edema, no varicosities. Abdomen:  Bowel sounds positive; abdomen soft and non-tender without masses Msk:  Back normal, normal gait. Muscle strength and tone normal. Pulses:  pulses normal in all 4 extremities Extremities:  No clubbing or cyanosis. Neurologic:  Alert and oriented x 3. Skin:  Intact without lesions or rashes. Psych:  Normal affect.   Impression & Recommendations:  Problem # 1:  ISCHEMIC CARDIOMYOPATHY (ICD-414.8) stress test showing large region of scar with no ischemia. She does have residual RCA disease no ischemia was noted. No  cardiac catheterization at this time.  The following medications were removed from the medication list:    Plavix 75 Mg Tabs (Clopidogrel bisulfate) .Marland Kitchen... 1 tablet daily Her updated medication list for this problem includes:    Nitrostat 0.4 Mg Subl (Nitroglycerin) .Marland Kitchen... As needed    Triamterene-hctz 37.5-25 Mg Tabs (Triamterene-hctz) .Marland Kitchen... 1 tablet daily    Lisinopril 2.5 Mg Tabs (Lisinopril) .Marland Kitchen... Take one tablet by mouth daily    Carvedilol 3.125 Mg Tabs (Carvedilol) .Marland Kitchen... Take one tablet by mouth twice a day    Aspirin 81 Mg Tbec (Aspirin) .Marland Kitchen... Take one tablet by mouth daily  Problem # 2:  GI BLEEDING (ICD-578.9) She continues to require blood transfusion  despite holding her aspirin and using only Plavix. As she is 3 months out from her bare metal stent placement, we'll hold her Plavix, change to aspirin 81 mg daily. She is scheduled for a colonoscopy for GI bleeding on March 26.  Problem # 3:  CAROTID ARTERY STENOSIS, WITHOUT INFARCTION (ICD-433.10) we'll monitor her carotid stenosis on an annual basis. She has followup with Dr. Wyn Quaker.  The following medications were removed from the medication list:    Plavix 75 Mg Tabs (Clopidogrel bisulfate) .Marland Kitchen... 1 tablet daily Her updated medication list for this problem includes:    Aspirin 81 Mg Tbec (Aspirin) .Marland Kitchen... Take one tablet by mouth daily  Problem # 4:  HYPERLIPIDEMIA-MIXED (ICD-272.4) Goal cholesterol his LDL less than 70. We will check her cholesterol in the next week or so.  Her updated medication list for this problem includes:    Simvastatin 40 Mg Tabs (Simvastatin) .Marland Kitchen... 1 tablet daily  Orders: T-Hepatic Function [phone]) T-Lipid Profile [phone])  Patient Instructions: 1)  Your physician recommends that you schedule a follow-up appointment in: 3 months 2)  Your physician recommends that you return  for a FASTING lipid profile: (lipid/lft) to be done at your next lab appt, please take order that we gave you in the office to this appt. 3)  Your physician has recommended you make the following change in your medication: STOP Plavix. START Aspirin 81mg  once daily.

## 2010-12-25 ENCOUNTER — Telehealth: Payer: Self-pay | Admitting: *Deleted

## 2010-12-25 ENCOUNTER — Ambulatory Visit: Payer: Self-pay | Admitting: Gastroenterology

## 2010-12-25 NOTE — Telephone Encounter (Signed)
Attempted to contact pt, left msg that she needs to stop ASA 7 days prior to surgery per Dr. Mariah Milling.

## 2010-12-28 NOTE — Consult Note (Signed)
Summary: AVVS  AVVS   Imported By: Marylou Mccoy 12/18/2010 16:32:21  _____________________________________________________________________  External Attachment:    Type:   Image     Comment:   External Document

## 2010-12-29 ENCOUNTER — Encounter: Payer: Self-pay | Admitting: Cardiology

## 2010-12-30 ENCOUNTER — Emergency Department: Payer: Self-pay | Admitting: Emergency Medicine

## 2011-01-12 ENCOUNTER — Encounter: Payer: Self-pay | Admitting: Cardiovascular Disease

## 2011-01-15 ENCOUNTER — Encounter: Payer: Self-pay | Admitting: Cardiovascular Disease

## 2011-01-17 ENCOUNTER — Encounter: Payer: Self-pay | Admitting: Cardiology

## 2011-02-06 ENCOUNTER — Ambulatory Visit: Payer: Medicare Other | Admitting: Cardiovascular Disease

## 2011-03-08 ENCOUNTER — Encounter: Payer: Self-pay | Admitting: Cardiovascular Disease

## 2011-03-12 ENCOUNTER — Ambulatory Visit (INDEPENDENT_AMBULATORY_CARE_PROVIDER_SITE_OTHER): Payer: Medicare Other | Admitting: Cardiovascular Disease

## 2011-03-12 ENCOUNTER — Encounter: Payer: Self-pay | Admitting: Cardiovascular Disease

## 2011-03-12 DIAGNOSIS — I2510 Atherosclerotic heart disease of native coronary artery without angina pectoris: Secondary | ICD-10-CM

## 2011-03-12 DIAGNOSIS — K922 Gastrointestinal hemorrhage, unspecified: Secondary | ICD-10-CM

## 2011-03-12 DIAGNOSIS — I2589 Other forms of chronic ischemic heart disease: Secondary | ICD-10-CM

## 2011-03-12 DIAGNOSIS — I6529 Occlusion and stenosis of unspecified carotid artery: Secondary | ICD-10-CM

## 2011-03-12 DIAGNOSIS — R0989 Other specified symptoms and signs involving the circulatory and respiratory systems: Secondary | ICD-10-CM

## 2011-03-12 DIAGNOSIS — E785 Hyperlipidemia, unspecified: Secondary | ICD-10-CM

## 2011-03-12 NOTE — Assessment & Plan Note (Signed)
Currently with no symptoms of angina. No further workup at this time. Continue current medication regimen. We will continue the low-dose aspirin given her history of recent GI bleeding, requiring transfusion. Hold on the Plavix until we are given approval by Dr. Marva Panda.

## 2011-03-12 NOTE — Patient Instructions (Signed)
You are doing well. No medication changes were made. Do not stop your aspirin for any procedures (including cataract surgery) Please call us if you have new issues that need to be addressed before your next appt.  We will call you for a follow up Appt. In 6 months

## 2011-03-12 NOTE — Assessment & Plan Note (Signed)
The periodic carotid ultrasound. Continue aggressive cholesterol management.

## 2011-03-12 NOTE — Assessment & Plan Note (Signed)
We'll continue aggressive medical management. We'll try to obtain her most recent cholesterol from Dr. Randa Lynn for our records.

## 2011-03-12 NOTE — Progress Notes (Signed)
   Patient ID: Karen Collins, female    DOB: April 05, 1934, 77 y.o.   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 in late December requiring transfusion with readmission to the hospital in early January for continued lower GI bleed. She presents for routine followup   Overall, she reports that she has significant constipation on her iron and only takes one per day. Per Her report, her blood count has been relatively stable. She only takes aspirin once daily. She recently saw Dr. Ricki Rodriguez 3 weeks ago. She does have significant arthritis and joint pain which is worse than before. She also reports worsening memory problems."It is really a problem"  Her recent stress test showed a large region of scar in the inferior and inferolateral wall. Poor exercise tolerance.   EKG today shows normal sinus rhythm with rate 76 beats per minute no significant ST or T wave changes, Possible old inferolateral infarct      Review of Systems  Constitutional: Negative.   HENT: Negative.   Eyes: Negative.   Respiratory: Negative.   Cardiovascular: Negative.   Gastrointestinal: Negative.   Musculoskeletal: Positive for arthralgias.  Skin: Negative.   Neurological: Negative.   Hematological: Negative.   Psychiatric/Behavioral: Negative.        Memory problem  All other systems reviewed and are negative.    BP 123/71  Pulse 76  Ht 5\' 4"  (1.626 m)  Wt 145 lb (65.772 kg)  BMI 24.89 kg/m2   Physical Exam  Nursing note and vitals reviewed. Constitutional: She is  oriented to person, place, and time. She appears well-developed and well-nourished.  HENT:  Head: Normocephalic.  Nose: Nose normal.  Mouth/Throat: Oropharynx is clear and moist.  Eyes: Conjunctivae are normal. Pupils are equal, round, and reactive to light.  Neck: Normal range of motion. Neck supple. No JVD present.  Cardiovascular: Normal rate, regular rhythm, S1 normal, S2 normal and intact distal pulses.  Exam reveals no gallop and no friction rub.   Murmur heard.  Crescendo systolic murmur is present with a grade of 2/6  Pulmonary/Chest: Effort normal and breath sounds normal. No respiratory distress. She has no wheezes. She has no rales. She exhibits no tenderness.  Abdominal: Soft. Bowel sounds are normal. She exhibits no distension. There is no tenderness.  Musculoskeletal: Normal range of motion. She exhibits no edema and no tenderness.  Lymphadenopathy:    She has no cervical adenopathy.  Neurological: She is alert and oriented to person, place, and time. Coordination normal.  Skin: Skin is warm and dry. No rash noted. No erythema.  Psychiatric: She has a normal mood and affect. Her behavior is normal. Judgment and thought content normal.         Assessment and Plan

## 2011-03-13 ENCOUNTER — Ambulatory Visit: Payer: Self-pay | Admitting: Ophthalmology

## 2011-03-15 ENCOUNTER — Telehealth: Payer: Self-pay | Admitting: Emergency Medicine

## 2011-03-15 NOTE — Telephone Encounter (Signed)
Dr. Lequita Halt is scheduling patient for surgery on patients knee. She is having a scope w/ synovectomy. They wanted to know if patient is cleared for surgery. Please advise. Thanks, Huntley Dec

## 2011-03-16 NOTE — Telephone Encounter (Signed)
Pt is not having knee surgery, this note was created in error.

## 2011-03-16 NOTE — Telephone Encounter (Signed)
Ok to have knee surgery. Do not stop ASA, even for a day

## 2011-03-27 ENCOUNTER — Ambulatory Visit: Payer: Self-pay | Admitting: Ophthalmology

## 2011-06-07 ENCOUNTER — Ambulatory Visit: Payer: Self-pay | Admitting: General Surgery

## 2011-06-26 ENCOUNTER — Ambulatory Visit: Payer: Self-pay | Admitting: General Surgery

## 2011-10-04 ENCOUNTER — Ambulatory Visit: Payer: Self-pay | Admitting: Internal Medicine

## 2012-01-30 IMAGING — CR DG CHEST 1V PORT
1 series · 1 of 1 positions shown · non-contrast
Comparison: none

REASON FOR EXAM: Chest Pain
COMMENTS:

PROCEDURE:     DXR - DXR PORTABLE CHEST SINGLE VIEW  - September 17, 2010  [DATE]
RESULT:     The lungs are clear. The cardiac silhouette and visualized bony
skeleton are unremarkable.

[view not recorded]
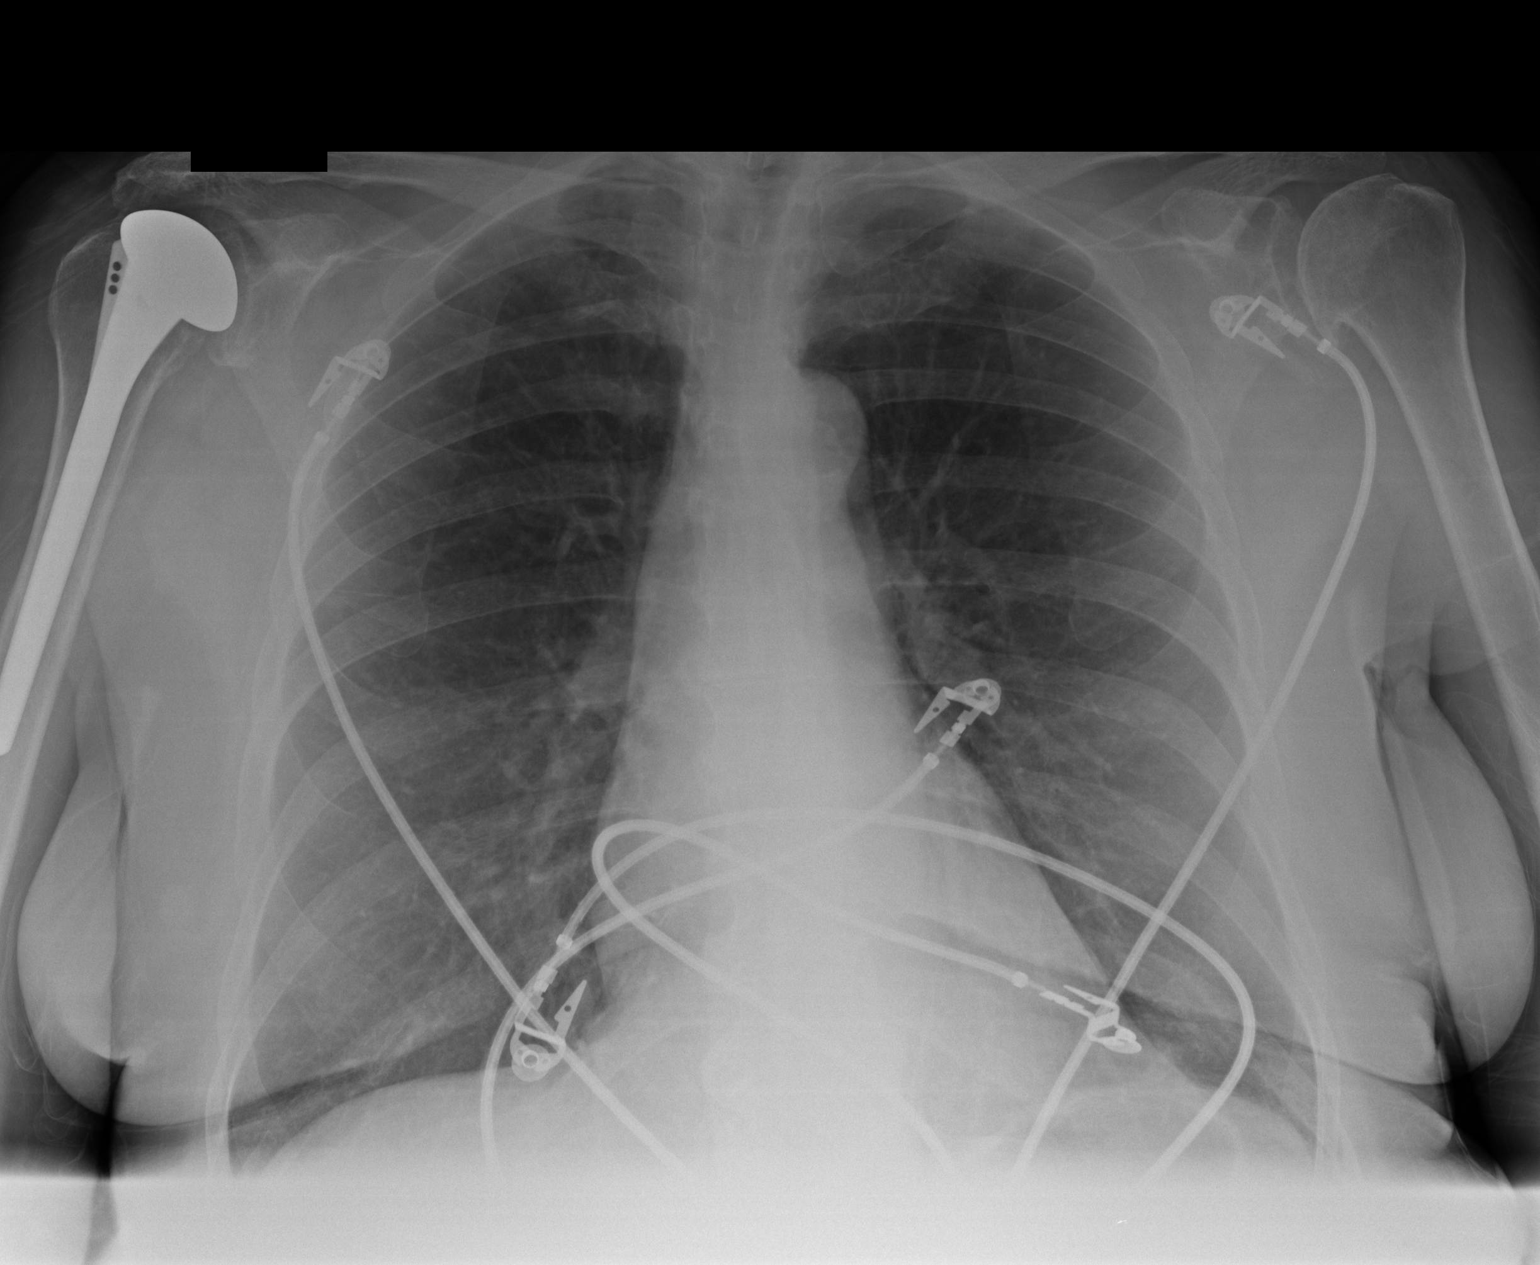

[1 of 1 positions shown; findings below may reference images not displayed]

IMPRESSION: 1. Chest radiograph without evidence of acute cardiopulmonary disease.
2. This study was compared to a previous study dated 12/10/2009.

## 2012-03-13 ENCOUNTER — Ambulatory Visit: Payer: Self-pay | Admitting: General Surgery

## 2012-03-25 LAB — CBC
HCT: 38.7 % (ref 35.0–47.0)
HGB: 12.9 g/dL (ref 12.0–16.0)
MCH: 31.1 pg (ref 26.0–34.0)
MCHC: 33.3 g/dL (ref 32.0–36.0)
MCV: 93 fL (ref 80–100)
Platelet: 196 10*3/uL (ref 150–440)
RBC: 4.15 10*6/uL (ref 3.80–5.20)
RDW: 13.6 % (ref 11.5–14.5)
WBC: 3.8 10*3/uL (ref 3.6–11.0)

## 2012-03-25 LAB — CK TOTAL AND CKMB (NOT AT ARMC)
CK, Total: 66 U/L (ref 21–215)
CK-MB: 2.7 ng/mL (ref 0.5–3.6)

## 2012-03-25 LAB — TROPONIN I: Troponin-I: 0.16 ng/mL — ABNORMAL HIGH

## 2012-03-25 LAB — BASIC METABOLIC PANEL
BUN: 14 mg/dL (ref 7–18)
Co2: 27 mmol/L (ref 21–32)
Osmolality: 270 (ref 275–301)
Sodium: 135 mmol/L — ABNORMAL LOW (ref 136–145)

## 2012-03-26 ENCOUNTER — Inpatient Hospital Stay: Payer: Self-pay | Admitting: Internal Medicine

## 2012-03-26 DIAGNOSIS — R079 Chest pain, unspecified: Secondary | ICD-10-CM

## 2012-03-26 DIAGNOSIS — R748 Abnormal levels of other serum enzymes: Secondary | ICD-10-CM

## 2012-03-26 LAB — URINALYSIS, COMPLETE
Bilirubin,UR: NEGATIVE
Blood: NEGATIVE
Glucose,UR: NEGATIVE mg/dL (ref 0–75)
Ph: 7 (ref 4.5–8.0)
Protein: NEGATIVE
Specific Gravity: 1.003 (ref 1.003–1.030)
Squamous Epithelial: <1

## 2012-03-26 LAB — CK TOTAL AND CKMB (NOT AT ARMC)
CK, Total: 79 U/L (ref 21–215)
CK-MB: 8.2 ng/mL — ABNORMAL HIGH (ref 0.5–3.6)
CK-MB: 4.7 ng/mL — ABNORMAL HIGH (ref 0.5–3.6)

## 2012-03-26 LAB — APTT: Activated PTT: 120.8 secs — ABNORMAL HIGH (ref 23.6–35.9)

## 2012-03-26 LAB — TROPONIN I: Troponin-I: 0.59 ng/mL — ABNORMAL HIGH

## 2012-03-27 DIAGNOSIS — I214 Non-ST elevation (NSTEMI) myocardial infarction: Secondary | ICD-10-CM

## 2012-03-27 LAB — BASIC METABOLIC PANEL
Anion Gap: 6 — ABNORMAL LOW (ref 7–16)
BUN: 13 mg/dL (ref 7–18)
Calcium, Total: 9.1 mg/dL (ref 8.5–10.1)
Creatinine: 1.28 mg/dL (ref 0.60–1.30)
EGFR (African American): 47 — ABNORMAL LOW
EGFR (Non-African Amer.): 41 — ABNORMAL LOW
Glucose: 92 mg/dL (ref 65–99)
Osmolality: 270 (ref 275–301)
Potassium: 4.2 mmol/L (ref 3.5–5.1)
Sodium: 135 mmol/L — ABNORMAL LOW (ref 136–145)

## 2012-03-27 LAB — LIPID PANEL
Cholesterol: 170 mg/dL (ref 0–200)
HDL Cholesterol: 46 mg/dL (ref 40–60)
Ldl Cholesterol, Calc: 99 mg/dL (ref 0–100)
Triglycerides: 125 mg/dL (ref 0–200)

## 2012-03-27 LAB — HEMOGLOBIN: HGB: 12.9 g/dL (ref 12.0–16.0)

## 2012-03-27 LAB — PLATELET COUNT: Platelet: 218 10*3/uL (ref 150–440)

## 2012-03-27 LAB — PROTIME-INR: Prothrombin Time: 13.5 secs (ref 11.5–14.7)

## 2012-03-28 ENCOUNTER — Encounter: Payer: Self-pay | Admitting: Cardiovascular Disease

## 2012-03-28 ENCOUNTER — Telehealth: Payer: Self-pay

## 2012-03-28 NOTE — Telephone Encounter (Signed)
LMTCB [id number] hospitalization  Called pt on alternate # provided on her answering machine  She says she is doing well.  Confirmed med list (no changes).  She confirms she has all RX she needs.  She is actually at her mother's house, visiting.  She had a friend drive her there today.  She reports sleeping well last night at home and denies any symptoms.  We made her an appt with Dr. Kirke Corin 04/01/12 at 1015.  Understanding verb.

## 2012-04-01 ENCOUNTER — Encounter: Payer: Self-pay | Admitting: Cardiovascular Disease

## 2012-04-01 ENCOUNTER — Ambulatory Visit (INDEPENDENT_AMBULATORY_CARE_PROVIDER_SITE_OTHER): Payer: Medicare Other | Admitting: Cardiovascular Disease

## 2012-04-01 VITALS — BP 102/82 | HR 81 | Ht 64.0 in | Wt 159.5 lb

## 2012-04-01 DIAGNOSIS — I2510 Atherosclerotic heart disease of native coronary artery without angina pectoris: Secondary | ICD-10-CM

## 2012-04-01 DIAGNOSIS — E785 Hyperlipidemia, unspecified: Secondary | ICD-10-CM

## 2012-04-01 DIAGNOSIS — I2589 Other forms of chronic ischemic heart disease: Secondary | ICD-10-CM

## 2012-04-01 NOTE — Assessment & Plan Note (Signed)
Recent ejection fraction was 35% which is out of proportion to her coronary artery disease. Continue treatment with a small dose carvedilol and lisinopril. The dose cannot be increased at this time due to relatively low blood pressure. She appears to be euvolemic.

## 2012-04-01 NOTE — Assessment & Plan Note (Signed)
She appears to be stable after recent hospital discharge for a small non-ST elevation myocardial infarction. Based on her coronary anatomy, medical therapy was recommended without revascularization. She was referred to cardiac rehabilitation and I encouraged her to attend. She has not had any chest pain since hospital discharge.

## 2012-04-01 NOTE — Patient Instructions (Addendum)
Continue same medications.  Attend cardiac rehab.  Follow up in 3 months with Dr. Mariah Milling.

## 2012-04-01 NOTE — Assessment & Plan Note (Signed)
Continue treatment with simvastatin with a target LDL of less than 70. 

## 2012-04-01 NOTE — Progress Notes (Signed)
HPI  78 year old female who is here today for a post hospital followup visit. 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  She presented recently to [facility] with chest pain and dyspnea. She was found to have a small non-ST elevation. She underwent cardiac catheterization which showed an occluded mid left circumflex at previously placed stents with very good collaterals, 60% mid RCA stenosis which is unchanged from before. EF was 35% which was out of proportion to coronary artery disease. Given that the left circumflex had good collaterals and the uncertainty in patient's ability to tolerate dual antiplatelet therapy, medical therapy was recommended. Since hospital discharge, she has been doing reasonably well. She denies any chest pain. Her dyspnea is stable. I reviewed all her medications with her today and answered all her questions.     Allergies  Allergen Reactions  . Tramadol      Current Outpatient Prescriptions on File Prior to Visit  Medication Sig Dispense Refill  . acetaminophen (TYLENOL) 500 MG tablet Take 500 mg by mouth every 6 (six) hours as needed.        Marland Kitchen aspirin 81 MG tablet Take by mouth daily.       . calcium carbonate (CALCIUM 600) 600 MG TABS Take 600 mg by mouth daily.        . carvedilol (COREG) 3.125 MG tablet Take 3.125 mg by mouth 2 (two) times daily with a meal.        . cholecalciferol (VITAMIN D) 1000 UNITS tablet Take 1,000 Units by mouth daily.        . citalopram (CELEXA) 40 MG tablet Take 40 mg by mouth daily.        . diphenhydrAMINE (BENADRYL) 25 MG tablet Take 25 mg by mouth daily as  needed.        . diphenhydrAMINE (SOMINEX) 25 MG tablet Take 25 mg by mouth as needed.        . Glucosamine-Chondroit-Vit C-Mn (GLUCOSAMINE 1500 COMPLEX) CAPS Take by mouth.        . iron polysaccharides (NIFEREX) 150 MG capsule Take 150 mg by mouth daily.        Marland Kitchen lisinopril (PRINIVIL,ZESTRIL) 2.5 MG tablet Take 2.5 mg by mouth daily.        Marland Kitchen LORazepam (ATIVAN) 1 MG tablet Take 1 mg by mouth every 4 (four) hours as needed.        . Multiple Vitamin (MULTIVITAMIN) tablet Take 1 tablet by mouth daily.        . nitroGLYCERIN (NITROSTAT) 0.4 MG SL tablet Place 0.4 mg under the tongue every 5 (five) minutes as needed.        . simvastatin (ZOCOR) 40 MG tablet Take 40 mg by mouth at bedtime.        . triamterene-hydrochlorothiazide (DYAZIDE) 37.5-25 MG per capsule Take 1 capsule by mouth every morning.        . zolpidem (AMBIEN) 10 MG tablet Take 10 mg by mouth at bedtime as needed.           Past Medical History  Diagnosis Date  . Hyperlipidemia   . Hypertension   .  CAD (coronary artery disease)   . MI (myocardial infarction)     Non-ST elevation MI  . GI bleed   . Renal cell carcinoma   . H/O cardiac arrest 2013  . Anxiety   . Chronic kidney disease (CKD), stage III (moderate)   . Depression   . GERD (gastroesophageal reflux disease)   . Chronic systolic congestive heart failure     EF of 35 %   . Arthritis   . Renal cancer   . AAA (abdominal aortic aneurysm)     s/p stent   . Kidney stones      Past Surgical History  Procedure Date  . Cardiac catheterization   . Cardiac catheterization 09/18/2010  . Coronary stent placement 09/18/2010    CAD; MI s/p stent  . Nephrectomy 10/2008    Right  . Shoulder surgery 07/2010    Right  . Abdominal aortic aneurysm repair     s/p stent     Family History  Problem Relation Age of Onset  . Arthritis Mother   . Heart disease Father      History   Social History  . Marital Status: Divorced    Spouse Name: N/A    Number  of Children: N/A  . Years of Education: N/A   Occupational History  . Retired    Social History Main Topics  . Smoking status: Former Smoker -- 1.0 packs/day for 30 years    Quit date: 10/01/2005  . Smokeless tobacco: Not on file  . Alcohol Use: 1.5 oz/week    3 drink(s) per week     Wine 2-3 times weekly  . Drug Use: No  . Sexually Active: Not on file   Other Topics Concern  . Not on file   Social History Narrative   MarriedWalks occasionally with house work      PHYSICAL EXAM   BP 102/82  Pulse 81  Ht 5\' 4"  (1.626 m)  Wt 159 lb 8 oz (72.349 kg)  BMI 27.38 kg/m2  Constitutional: She is oriented to person, place, and time. She appears well-developed and well-nourished. No distress.  HENT: No nasal discharge.  Head: Normocephalic and atraumatic.  Eyes: Pupils are equal and round. Right eye exhibits no discharge. Left eye exhibits no discharge.  Neck: Normal range of motion. Neck supple. No JVD present. No thyromegaly present.  Cardiovascular: Normal rate, regular rhythm, normal heart sounds. Exam reveals no gallop and no friction rub. No murmur heard.  Pulmonary/Chest: Effort normal and breath sounds normal. No stridor. No respiratory distress. She has no wheezes. She has no rales. She exhibits no tenderness.  Abdominal: Soft. Bowel sounds are normal. She exhibits no distension. There is no tenderness. There is no rebound and no guarding.  Musculoskeletal: Normal range of motion. She exhibits no edema and no tenderness.  Neurological: She is alert and oriented to person, place, and time. Coordination normal.  Skin: Skin is warm and dry. No rash noted. She is not diaphoretic. No erythema. No pallor.  Psychiatric: She has a normal mood and affect. Her behavior is normal. Judgment and thought content normal.    EKG: Sinus  Rhythm  -Old inferolateral infarct  -Prominent R(V1) -true posterior extension of inferior MI  -  -  Nonspecific T-abnormality.    ASSESSMENT AND  PLAN

## 2012-04-04 ENCOUNTER — Encounter: Payer: Medicare Other | Admitting: Cardiovascular Disease

## 2012-04-14 ENCOUNTER — Encounter: Payer: Self-pay | Admitting: Cardiovascular Disease

## 2012-05-01 ENCOUNTER — Encounter: Payer: Self-pay | Admitting: Cardiovascular Disease

## 2012-06-01 ENCOUNTER — Encounter: Payer: Self-pay | Admitting: Cardiovascular Disease

## 2012-06-05 ENCOUNTER — Other Ambulatory Visit: Payer: Self-pay | Admitting: Gastroenterology

## 2012-06-05 LAB — CLOSTRIDIUM DIFFICILE BY PCR

## 2012-06-06 ENCOUNTER — Ambulatory Visit: Payer: Self-pay | Admitting: Gastroenterology

## 2012-06-07 LAB — STOOL CULTURE

## 2012-07-01 ENCOUNTER — Encounter: Payer: Self-pay | Admitting: Cardiovascular Disease

## 2012-07-31 ENCOUNTER — Telehealth: Payer: Self-pay

## 2012-07-31 NOTE — Telephone Encounter (Signed)
I rec'd correspondence from cardiac rehab at [facility] stating Karen Collins has been feeling more tired than usual and they are concerned about Karen Collins. I called Karen Collins who says she definitely has been feeling worse lately. Her main complaint is severe fatigue. She denies other symptoms of cp, sob.  She has appt with Dr. Mariah Milling 11/18 but wishes to make a sooner appt I offered to work her in with Dr. Kirke Corin tomm since Dr. Mariah Milling is out of town.  She decvlines, wishes to only see Dr. Mariah Milling. I scheduled her with him 11/6 at 2 pm. She will call us should she feel she needs to be seen sooner.

## 2012-08-01 ENCOUNTER — Encounter: Payer: Self-pay | Admitting: Cardiovascular Disease

## 2012-08-06 ENCOUNTER — Encounter: Payer: Self-pay | Admitting: Cardiovascular Disease

## 2012-08-06 ENCOUNTER — Ambulatory Visit (INDEPENDENT_AMBULATORY_CARE_PROVIDER_SITE_OTHER): Payer: Medicare Other | Admitting: Cardiovascular Disease

## 2012-08-06 VITALS — BP 110/72 | HR 70 | Ht 64.0 in | Wt 160.0 lb

## 2012-08-06 DIAGNOSIS — I2510 Atherosclerotic heart disease of native coronary artery without angina pectoris: Secondary | ICD-10-CM

## 2012-08-06 DIAGNOSIS — I6529 Occlusion and stenosis of unspecified carotid artery: Secondary | ICD-10-CM

## 2012-08-06 DIAGNOSIS — E785 Hyperlipidemia, unspecified: Secondary | ICD-10-CM

## 2012-08-06 MED ORDER — LISINOPRIL 5 MG PO TABS: 5.0000 mg | ORAL_TABLET | Freq: Every day | ORAL | Status: DC

## 2012-08-06 MED ORDER — ATORVASTATIN CALCIUM 80 MG PO TABS: 80.0000 mg | ORAL_TABLET | Freq: Every day | ORAL | Status: DC

## 2012-08-06 NOTE — Assessment & Plan Note (Signed)
Continue aggressive cholesterol management. Goal LDL less than 70 

## 2012-08-06 NOTE — Progress Notes (Signed)
Patient ID: Karen Collins, female    DOB: [birth year], 78 y.o.   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 She presents for routine followup  She presented July 2013 to [facility] with chest pain and dyspnea. She was found to have a small non-ST elevation. She underwent cardiac catheterization which showed an occluded mid left circumflex at previously placed stents with very good collaterals, 60% mid RCA stenosis which is unchanged from before. EF was 35% which was out of proportion to coronary artery disease. Given collaterals to the left circumflex, medical management was recommended.   She does have significant arthritis and joint pain which is worse than before. She also reports worsening memory problems.  EKG today shows normal sinus rhythm with rate 70- beats per minute no significant ST or T wave changes, Possible old inferior and anterolateral l infarct       Outpatient Encounter Prescriptions as of 08/06/2012  Medication Sig Dispense Refill  . acetaminophen (TYLENOL) 500 MG tablet Take 500 mg by mouth as needed.      Marland Kitchen aspirin 81 MG tablet Take by mouth daily.       . calcium carbonate (CALCIUM 600) 600 MG TABS Take 600 mg by mouth daily.        . carvedilol (COREG) 3.125 MG tablet Take 3.125 mg by mouth 2 (two) times daily with a meal.        . Cholecalciferol (VITAMIN D) 400 UNITS capsule Take 400 Units by mouth daily.        . citalopram (CELEXA) 40 MG tablet Take 40 mg by mouth daily.        . diphenhydrAMINE (BENADRYL) 25 MG tablet Take 25 mg by mouth daily as needed.        . diphenhydrAMINE (SOMINEX) 25 MG tablet Take 25 mg by mouth as needed.        . docusate sodium (COLACE) 100 MG capsule Take 100 mg by mouth daily as needed.      . Glucosamine-Chondroit-Vit C-Mn (GLUCOSAMINE 1500 COMPLEX) CAPS Take by mouth.        Marland Kitchen ibuprofen (ADVIL,MOTRIN) 600 MG tablet Take 600 mg by mouth as needed.      . loperamide (ANTI-DIARRHEAL) 2 MG tablet Take 2 mg by mouth as needed.      Marland Kitchen LORazepam (ATIVAN) 1 MG tablet Take 1 mg by mouth 2 (two) times daily.       . Multiple Vitamin (MULTIVITAMIN) tablet Take 1 tablet by mouth daily.        . nitroGLYCERIN (NITROSTAT) 0.4 MG SL tablet Place 0.4 mg under the tongue every 5 (five) minutes as needed.        . NON FORMULARY Take by mouth daily. Probiotics      . Omega-3 Fatty Acids (FISH OIL) 1200 MG CAPS Take 1,200 mg by mouth daily.      . simvastatin (ZOCOR) 40 MG tablet Take 40 mg by mouth at bedtime.        Marland Kitchen  triamterene-hydrochlorothiazide (DYAZIDE) 37.5-25 MG per capsule Take 1 capsule by mouth every morning.        . vitamin E 400 UNIT capsule Take 400 Units by mouth daily.      Marland Kitchen zolpidem (AMBIEN) 10 MG tablet Take 10 mg by mouth at bedtime as needed.        Marland Kitchen atorvastatin (LIPITOR) 80 MG tablet Take 1 tablet (80 mg total) by mouth daily.  90 tablet  3  . lisinopril (PRINIVIL,ZESTRIL) 5 MG tablet Take 1 tablet (5 mg total) by mouth daily.  90 tablet  3    Review of Systems  Constitutional: Negative.   HENT: Negative.   Eyes: Negative.   Respiratory: Negative.   Cardiovascular: Negative.   Gastrointestinal: Negative.   Musculoskeletal: Positive for arthralgias.  Skin: Negative.   Neurological: Negative.   Hematological: Negative.   Psychiatric/Behavioral: Negative.        Memory problem  All other systems reviewed and are negative.    BP 110/72  Pulse 70   Ht 5\' 4"  (1.626 m)  Wt 160 lb (72.576 kg)  BMI 27.46 kg/m2  Physical Exam  Nursing note and vitals reviewed. Constitutional: She is oriented to person, place, and time. She appears well-developed and well-nourished.  HENT:  Head: Normocephalic.  Nose: Nose normal.  Mouth/Throat: Oropharynx is clear and moist.  Eyes: Conjunctivae normal are normal. Pupils are equal, round, and reactive to light.  Neck: Normal range of motion. Neck supple. No JVD present.  Cardiovascular: Normal rate, regular rhythm, S1 normal, S2 normal and intact distal pulses.  Exam reveals no gallop and no friction rub.   Murmur heard.  Crescendo systolic murmur is present with a grade of 2/6  Pulmonary/Chest: Effort normal and breath sounds normal. No respiratory distress. She has no wheezes. She has no rales. She exhibits no tenderness.  Abdominal: Soft. Bowel sounds are normal. She exhibits no distension. There is no tenderness.  Musculoskeletal: Normal range of motion. She exhibits no edema and no tenderness.  Lymphadenopathy:    She has no cervical adenopathy.  Neurological: She is alert and oriented to person, place, and time. Coordination normal.  Skin: Skin is warm and dry. No rash noted. No erythema.  Psychiatric: She has a normal mood and affect. Her behavior is normal. Judgment and thought content normal.         Assessment and Plan

## 2012-08-06 NOTE — Assessment & Plan Note (Addendum)
We have recommended that she change simvastatin 40 mg to Lipitor 80 mg daily. Most recent LDL was well above goal. Goal LDL less than 70. She would like to continue generic medications if possible.

## 2012-08-06 NOTE — Assessment & Plan Note (Signed)
Currently with no symptoms of angina. No further workup at this time. Continue current medication regimen. 

## 2012-08-06 NOTE — Patient Instructions (Addendum)
You are doing well. Please take two of your simvastatin until you run out Then change to atorvastatin one a day  Please restart lisinopril one a day Cut the triamterene/HCTZ in 1/2  Please call us if you have new issues that need to be addressed before your next appt.  Your physician wants you to follow-up in: 6 months.  You will receive a reminder letter in the mail two months in advance. If you don't receive a letter, please call our office to schedule the follow-up appointment.

## 2012-08-18 ENCOUNTER — Ambulatory Visit: Payer: Medicare Other | Admitting: Cardiovascular Disease

## 2012-12-03 ENCOUNTER — Ambulatory Visit: Payer: Self-pay | Admitting: Gastroenterology

## 2012-12-25 ENCOUNTER — Ambulatory Visit: Payer: Self-pay | Admitting: Internal Medicine

## 2013-01-09 ENCOUNTER — Ambulatory Visit: Payer: Self-pay | Admitting: Neurology

## 2013-02-11 ENCOUNTER — Emergency Department: Payer: Self-pay | Admitting: Emergency Medicine

## 2013-02-11 LAB — CBC
HCT: 43.5 % (ref 35.0–47.0)
MCH: 30 pg (ref 26.0–34.0)
MCHC: 34.1 g/dL (ref 32.0–36.0)
MCV: 88 fL (ref 80–100)
RDW: 13.7 % (ref 11.5–14.5)

## 2013-02-11 LAB — COMPREHENSIVE METABOLIC PANEL
Albumin: 4 g/dL (ref 3.4–5.0)
Alkaline Phosphatase: 89 U/L (ref 50–136)
Bilirubin,Total: 0.8 mg/dL (ref 0.2–1.0)
Calcium, Total: 10.3 mg/dL — ABNORMAL HIGH (ref 8.5–10.1)
Chloride: 95 mmol/L — ABNORMAL LOW (ref 98–107)
Co2: 25 mmol/L (ref 21–32)
Creatinine: 0.81 mg/dL (ref 0.60–1.30)
EGFR (African American): >60
Glucose: 108 mg/dL — ABNORMAL HIGH (ref 65–99)
Potassium: 3.3 mmol/L — ABNORMAL LOW (ref 3.5–5.1)
SGOT(AST): 23 U/L (ref 15–37)
SGPT (ALT): 29 U/L (ref 12–78)
Sodium: 128 mmol/L — ABNORMAL LOW (ref 136–145)
Total Protein: 6.9 g/dL (ref 6.4–8.2)

## 2013-02-11 LAB — URINALYSIS, COMPLETE
Bacteria: NONE SEEN
Blood: NEGATIVE
Glucose,UR: NEGATIVE mg/dL (ref 0–75)
Leukocyte Esterase: NEGATIVE
Nitrite: NEGATIVE
Protein: NEGATIVE
Squamous Epithelial: NONE SEEN

## 2013-03-05 ENCOUNTER — Ambulatory Visit: Payer: Medicare Other | Admitting: Cardiovascular Disease

## 2013-04-09 ENCOUNTER — Emergency Department: Payer: Self-pay | Admitting: Emergency Medicine

## 2013-04-09 LAB — URINALYSIS, COMPLETE
Glucose,UR: NEGATIVE mg/dL (ref 0–75)
Ketone: NEGATIVE
Nitrite: NEGATIVE
Ph: 6 (ref 4.5–8.0)
RBC,UR: 1 /HPF (ref 0–5)
Squamous Epithelial: <1
WBC UR: <1 /HPF (ref 0–5)

## 2013-04-09 LAB — COMPREHENSIVE METABOLIC PANEL
Albumin: 3.6 g/dL (ref 3.4–5.0)
Alkaline Phosphatase: 101 U/L (ref 50–136)
BUN: 5 mg/dL — ABNORMAL LOW (ref 7–18)
Chloride: 91 mmol/L — ABNORMAL LOW (ref 98–107)
Co2: 26 mmol/L (ref 21–32)
Creatinine: 1.15 mg/dL (ref 0.60–1.30)
EGFR (Non-African Amer.): 46 — ABNORMAL LOW
Glucose: 143 mg/dL — ABNORMAL HIGH (ref 65–99)
SGOT(AST): 15 U/L (ref 15–37)
SGPT (ALT): 20 U/L (ref 12–78)
Sodium: 125 mmol/L — ABNORMAL LOW (ref 136–145)
Total Protein: 6.6 g/dL (ref 6.4–8.2)

## 2013-04-09 LAB — CBC
MCH: 30.1 pg (ref 26.0–34.0)
MCV: 88 fL (ref 80–100)
RBC: 4.65 10*6/uL (ref 3.80–5.20)
RDW: 14.1 % (ref 11.5–14.5)

## 2013-04-10 LAB — BASIC METABOLIC PANEL
Anion Gap: 8 (ref 7–16)
BUN: 4 mg/dL — ABNORMAL LOW (ref 7–18)
Calcium, Total: 8.5 mg/dL (ref 8.5–10.1)
Chloride: 103 mmol/L (ref 98–107)
EGFR (Non-African Amer.): >60
Osmolality: 265 (ref 275–301)
Potassium: 3.3 mmol/L — ABNORMAL LOW (ref 3.5–5.1)

## 2013-05-28 ENCOUNTER — Encounter: Payer: Self-pay | Admitting: Internal Medicine

## 2013-05-28 ENCOUNTER — Ambulatory Visit (INDEPENDENT_AMBULATORY_CARE_PROVIDER_SITE_OTHER): Payer: Medicare Other | Admitting: Internal Medicine

## 2013-05-28 VITALS — BP 110/80 | HR 63 | Temp 98.0°F | Ht 62.25 in | Wt 149.5 lb

## 2013-05-28 DIAGNOSIS — R413 Other amnesia: Secondary | ICD-10-CM

## 2013-05-28 DIAGNOSIS — F329 Major depressive disorder, single episode, unspecified: Secondary | ICD-10-CM

## 2013-05-28 DIAGNOSIS — E785 Hyperlipidemia, unspecified: Secondary | ICD-10-CM

## 2013-05-28 DIAGNOSIS — E213 Hyperparathyroidism, unspecified: Secondary | ICD-10-CM

## 2013-05-28 DIAGNOSIS — C649 Malignant neoplasm of unspecified kidney, except renal pelvis: Secondary | ICD-10-CM

## 2013-05-28 DIAGNOSIS — Z8719 Personal history of other diseases of the digestive system: Secondary | ICD-10-CM

## 2013-05-28 DIAGNOSIS — I2589 Other forms of chronic ischemic heart disease: Secondary | ICD-10-CM

## 2013-05-28 DIAGNOSIS — N760 Acute vaginitis: Secondary | ICD-10-CM

## 2013-05-28 DIAGNOSIS — I10 Essential (primary) hypertension: Secondary | ICD-10-CM

## 2013-05-28 DIAGNOSIS — I714 Abdominal aortic aneurysm, without rupture: Secondary | ICD-10-CM

## 2013-05-28 DIAGNOSIS — D509 Iron deficiency anemia, unspecified: Secondary | ICD-10-CM

## 2013-05-28 DIAGNOSIS — I868 Varicose veins of other specified sites: Secondary | ICD-10-CM

## 2013-05-28 DIAGNOSIS — R109 Unspecified abdominal pain: Secondary | ICD-10-CM

## 2013-05-28 DIAGNOSIS — I2510 Atherosclerotic heart disease of native coronary artery without angina pectoris: Secondary | ICD-10-CM

## 2013-05-28 DIAGNOSIS — R0989 Other specified symptoms and signs involving the circulatory and respiratory systems: Secondary | ICD-10-CM

## 2013-05-28 DIAGNOSIS — I6529 Occlusion and stenosis of unspecified carotid artery: Secondary | ICD-10-CM

## 2013-05-29 ENCOUNTER — Telehealth: Payer: Self-pay | Admitting: *Deleted

## 2013-05-29 ENCOUNTER — Encounter: Payer: Self-pay | Admitting: Internal Medicine

## 2013-05-29 LAB — WET PREP BY MOLECULAR PROBE
Gardnerella vaginalis: NEGATIVE
Trichomonas vaginosis: NEGATIVE

## 2013-05-29 NOTE — Telephone Encounter (Signed)
Karen Collins with [name] needs a return call

## 2013-06-01 ENCOUNTER — Encounter: Payer: Self-pay | Admitting: Internal Medicine

## 2013-06-01 DIAGNOSIS — R109 Unspecified abdominal pain: Secondary | ICD-10-CM | POA: Insufficient documentation

## 2013-06-01 DIAGNOSIS — F329 Major depressive disorder, single episode, unspecified: Secondary | ICD-10-CM | POA: Insufficient documentation

## 2013-06-01 DIAGNOSIS — E213 Hyperparathyroidism, unspecified: Secondary | ICD-10-CM | POA: Insufficient documentation

## 2013-06-01 DIAGNOSIS — I10 Essential (primary) hypertension: Secondary | ICD-10-CM | POA: Insufficient documentation

## 2013-06-01 DIAGNOSIS — D509 Iron deficiency anemia, unspecified: Secondary | ICD-10-CM | POA: Insufficient documentation

## 2013-06-01 DIAGNOSIS — Z8719 Personal history of other diseases of the digestive system: Secondary | ICD-10-CM | POA: Insufficient documentation

## 2013-06-01 DIAGNOSIS — I868 Varicose veins of other specified sites: Secondary | ICD-10-CM | POA: Insufficient documentation

## 2013-06-01 DIAGNOSIS — R413 Other amnesia: Secondary | ICD-10-CM | POA: Insufficient documentation

## 2013-06-01 DIAGNOSIS — Z85528 Personal history of other malignant neoplasm of kidney: Secondary | ICD-10-CM | POA: Insufficient documentation

## 2013-06-01 DIAGNOSIS — I714 Abdominal aortic aneurysm, without rupture: Secondary | ICD-10-CM | POA: Insufficient documentation

## 2013-06-01 NOTE — Assessment & Plan Note (Signed)
Followed by Dr Dew.   

## 2013-06-01 NOTE — Assessment & Plan Note (Signed)
Follow cbc.  Has had GI w/up.   

## 2013-06-01 NOTE — Assessment & Plan Note (Signed)
S/p parathyroidectomy.  Follow calcium.  

## 2013-06-01 NOTE — Assessment & Plan Note (Signed)
On citalopram.  Stable.  Follow.   

## 2013-06-01 NOTE — Assessment & Plan Note (Signed)
Documented history.  Obtain records to review.  Follow.

## 2013-06-01 NOTE — Assessment & Plan Note (Signed)
Avoid nephrotoxins.  Follow renal function and electrolytes.

## 2013-06-01 NOTE — Progress Notes (Signed)
Subjective:    Patient ID: Karen Collins, female    DOB: [birth year], 79 y.o.   MRN: [id number]  HPI 79 year old female with past history of hypertension, hypercholesterolemia, hyperparathyroidism, CAD, chronic kidney disease, renal cell cancer and infrarenal AAA who comes in today to follow up on these issues as well as to establish care.  She is seeing Dr Sherryll Burger for memory issues.  Had MRI 01/09/13 - revealed chronic ischemic changes with no acute ischemic changes.  Did not feel medications would be beneficial.  Writes down things to help her remember.  Has seen Dr Marva Panda.  Has had colonoscopy and EGD.  Has been evaluated by [facility].  Still having some lower abdominal soreness and cramping.  Feels like having a period.  Feels similar to menstrual cramps.  She reports some vaginal irritation and burning.  Some external irritation.  Having consistent bowel movements.  Some frequent urination.  Some increased anxiety.  Feels this may contribute to some abdominal pain.  Reports some fatigue.  Has seen Dr Piedad Climes at [facility] - had lower abdominal endo.  Also evaluated by gyn Magdalen Spatz).  No abnormality found.  Lives by herself.  Doing relatively well with this.  Her sisters help her.     Past Medical History  Diagnosis Date  . Hyperlipidemia   . Hypertension   . CAD (coronary artery disease)   . MI (myocardial infarction)     Non-ST elevation MI  . GI bleed   . Renal cell carcinoma   . Anxiety   . Chronic kidney disease (CKD), stage III (moderate)   . Depression   . GERD (gastroesophageal reflux disease)   . Chronic systolic congestive heart failure     EF of 35 %   . Arthritis   . AAA (abdominal aortic aneurysm)     s/p stent   . Kidney stones   . Diverticulitis     H/O  . Hx of migraines   . Thyroid disease   . Hx: UTI (urinary tract infection)     Outpatient Encounter Prescriptions as of 05/28/2013  Medication Sig Dispense Refill  . acetaminophen (TYLENOL) 500 MG tablet Take 325 mg  by mouth as needed.       Marland Kitchen atorvastatin (LIPITOR) 80 MG tablet Take 1 tablet (80 mg total) by mouth daily.  90 tablet  3  . calcium carbonate (CALCIUM 600) 600 MG TABS Take 600 mg by mouth daily.        . cetirizine (ZYRTEC) 10 MG tablet Take 10 mg by mouth daily.      . Cholecalciferol (VITAMIN D) 400 UNITS capsule Take 400 Units by mouth daily.      . citalopram (CELEXA) 40 MG tablet Take 40 mg by mouth daily.        . diphenhydrAMINE (BENADRYL) 25 MG tablet Take 25 mg by mouth daily as needed.        . diphenhydrAMINE (SOMINEX) 25 MG tablet Take 25 mg by mouth as needed.        . docusate sodium (COLACE) 100 MG capsule Take 100 mg by mouth daily as needed.      . hyoscyamine (LEVBID) 0.375 MG 12 hr tablet Take 0.375 mg by mouth every 12 (twelve) hours as needed for cramping.      Marland Kitchen ibuprofen (ADVIL,MOTRIN) 600 MG tablet Take 600 mg by mouth as needed.      Marland Kitchen lisinopril (PRINIVIL,ZESTRIL) 5 MG tablet Take 1 tablet (5 mg total) by  mouth daily.  90 tablet  3  . loperamide (ANTI-DIARRHEAL) 2 MG tablet Take 2 mg by mouth as needed.      Marland Kitchen LORazepam (ATIVAN) 1 MG tablet Take 1 mg by mouth 2 (two) times daily.       . Multiple Vitamin (MULTIVITAMIN) tablet Take 1 tablet by mouth daily.        . nitroGLYCERIN (NITROSTAT) 0.4 MG SL tablet Place 0.4 mg under the tongue every 5 (five) minutes as needed.        . Omega-3 Fatty Acids (FISH OIL) 1200 MG CAPS Take 1,200 mg by mouth daily.      . phenylephrine (SUDAFED PE) 10 MG TABS tablet Take 10 mg by mouth every 4 (four) hours as needed.      . simvastatin (ZOCOR) 40 MG tablet Take 40 mg by mouth at bedtime.        . triamterene-hydrochlorothiazide (DYAZIDE) 37.5-25 MG per capsule Take 1 capsule by mouth every morning.        . venlafaxine (EFFEXOR) 75 MG tablet Take 75 mg by mouth 2 (two) times daily.      . vitamin E 400 UNIT capsule Take 400 Units by mouth daily.      Marland Kitchen zolpidem (AMBIEN) 10 MG tablet Take 10 mg by mouth at bedtime as needed.         . Glucosamine-Chondroit-Vit C-Mn (GLUCOSAMINE 1500 COMPLEX) CAPS Take by mouth.        . [DISCONTINUED] aspirin 81 MG tablet Take by mouth daily.       . [DISCONTINUED] carvedilol (COREG) 3.125 MG tablet Take 3.125 mg by mouth 2 (two) times daily with a meal.        . [DISCONTINUED] NON FORMULARY Take by mouth daily. Probiotics       No facility-administered encounter medications on file as of 05/28/2013.    Review of Systems Patient denies any headache, lightheadedness or dizziness.  No sinus or allergy symptoms.  No chest pain, tightness or palpitations. From a cardiac standpoint - stable and doing well.   No increased shortness of breath, cough or congestion.  No nausea or vomiting.  No acid reflux.  Does report some increased lower abdominal pressure that feels like menstrual cramping.  Some loose stool.  No BRBPR.  No melena.  Frequent urination.  Some anxiety.  Memory issues.  Seeing Dr Sherryll Burger.        Objective:   Physical Exam Filed Vitals:   05/28/13 1013  BP: 110/80  Pulse: 63  Temp: 98 F (28.10 C)   79 year old female in no acute distress.   HEENT:  Nares- clear.  Oropharynx - without lesions. NECK:  Supple.  Nontender.  No audible bruit.  HEART:  Appears to be regular. LUNGS:  No crackles or wheezing audible.  Respirations even and unlabored.  RADIAL PULSE:  Equal bilaterally.   ABDOMEN:  Soft, nontender.  Bowel sounds present and normal.  No audible abdominal bruit.  GU:  Normal external genitalia.  Some minimal perivaginal irritation.  Vaginal vault without lesions.  Could not appreciate any adnexal masses or tenderness.  EXTREMITIES:  No increased edema present.  DP pulses palpable and equal bilaterally.          Assessment & Plan:  VAGINAL IRRITATION.  Pelvic today.  Sent off for KOH and Wet prep.  May need nystatin cream.    HEALTH MAINTENANCE.  Will schedule her for a physical.  Obtain records.  Last mammogram 12/25/12 -  Birads II.  Obtain records regarding  colonoscopy.    I spent 45 minutes with the patient and more than 50% of the time was spent in consultation regarding the above.

## 2013-06-01 NOTE — Assessment & Plan Note (Signed)
Blood pressure doing well.  Check metabolic panel.   

## 2013-06-01 NOTE — Assessment & Plan Note (Addendum)
Persistent abdominal pain and cramping.  Has seen GI.  Evaluated at [facility].  Also evaluated by Magdalen Spatz - gyn.  Unclear as to the exact etiology.  Need to review previous w/up.  Currently stable.  Reviewed notes.  CT reportedly showed abdominal bowel wall thickening on CT.  The radiologist noted that there was a deformity of the cecum by what appeared to be an external tubular structure causing compression.  There was a question of a possible mucocele.  Has been diagnosed with IBS.  Subsequently had EUS.  No acute abnormality of the cecum identified.  Last CT 06/06/12 - no acute abnormality.

## 2013-06-01 NOTE — Assessment & Plan Note (Addendum)
On atorvastatin.  Low cholesterol diet.  Check lipid panel and liver function.   

## 2013-06-01 NOTE — Assessment & Plan Note (Addendum)
Worked up by Dr Skulskie.  Colonoscopy 2012 - diverticula and AVM.  EGD 11/2010 - normal except for AVM.    

## 2013-06-01 NOTE — Assessment & Plan Note (Addendum)
Stable.  Sees Dr Gollan.  Continue risk factor modification.  Is s/p MI and stenting.    

## 2013-06-01 NOTE — Assessment & Plan Note (Signed)
Seeing Dr Sherryll Burger.  Just had MRI.  Results as outlined.  Felt medications not warranted.  Follow. Continue to follow up with Dr Sherryll Burger.

## 2013-06-01 NOTE — Assessment & Plan Note (Signed)
S/p repair.  Sees Dr Dew.  

## 2013-06-01 NOTE — Assessment & Plan Note (Signed)
Currently doing well.  Asymptomatic.

## 2013-06-01 NOTE — Assessment & Plan Note (Signed)
Continue to follow up with Dr Dew.   

## 2013-06-01 NOTE — Assessment & Plan Note (Signed)
Refer to vascular for a f/u carotid ultrasound.

## 2013-06-02 ENCOUNTER — Encounter: Payer: Self-pay | Admitting: *Deleted

## 2013-07-03 ENCOUNTER — Ambulatory Visit: Payer: Medicare Other | Admitting: Internal Medicine

## 2013-07-09 ENCOUNTER — Ambulatory Visit (INDEPENDENT_AMBULATORY_CARE_PROVIDER_SITE_OTHER): Payer: Medicare Other | Admitting: Internal Medicine

## 2013-07-09 ENCOUNTER — Other Ambulatory Visit: Payer: Self-pay | Admitting: *Deleted

## 2013-07-09 ENCOUNTER — Encounter: Payer: Self-pay | Admitting: Internal Medicine

## 2013-07-09 VITALS — BP 130/90 | HR 102 | Temp 97.9°F | Ht 62.25 in | Wt 147.5 lb

## 2013-07-09 DIAGNOSIS — F419 Anxiety disorder, unspecified: Secondary | ICD-10-CM

## 2013-07-09 DIAGNOSIS — I714 Abdominal aortic aneurysm, without rupture, unspecified: Secondary | ICD-10-CM

## 2013-07-09 DIAGNOSIS — I2589 Other forms of chronic ischemic heart disease: Secondary | ICD-10-CM

## 2013-07-09 DIAGNOSIS — F32A Depression, unspecified: Secondary | ICD-10-CM

## 2013-07-09 DIAGNOSIS — I2510 Atherosclerotic heart disease of native coronary artery without angina pectoris: Secondary | ICD-10-CM

## 2013-07-09 DIAGNOSIS — R109 Unspecified abdominal pain: Secondary | ICD-10-CM

## 2013-07-09 DIAGNOSIS — Z23 Encounter for immunization: Secondary | ICD-10-CM

## 2013-07-09 DIAGNOSIS — E785 Hyperlipidemia, unspecified: Secondary | ICD-10-CM

## 2013-07-09 DIAGNOSIS — C649 Malignant neoplasm of unspecified kidney, except renal pelvis: Secondary | ICD-10-CM

## 2013-07-09 DIAGNOSIS — N183 Chronic kidney disease, stage 3 unspecified: Secondary | ICD-10-CM

## 2013-07-09 DIAGNOSIS — E213 Hyperparathyroidism, unspecified: Secondary | ICD-10-CM

## 2013-07-09 DIAGNOSIS — I868 Varicose veins of other specified sites: Secondary | ICD-10-CM

## 2013-07-09 DIAGNOSIS — I6529 Occlusion and stenosis of unspecified carotid artery: Secondary | ICD-10-CM

## 2013-07-09 DIAGNOSIS — R413 Other amnesia: Secondary | ICD-10-CM

## 2013-07-09 DIAGNOSIS — F329 Major depressive disorder, single episode, unspecified: Secondary | ICD-10-CM

## 2013-07-09 DIAGNOSIS — F411 Generalized anxiety disorder: Secondary | ICD-10-CM

## 2013-07-09 DIAGNOSIS — I10 Essential (primary) hypertension: Secondary | ICD-10-CM

## 2013-07-09 DIAGNOSIS — D509 Iron deficiency anemia, unspecified: Secondary | ICD-10-CM

## 2013-07-09 LAB — BASIC METABOLIC PANEL
BUN: 12 mg/dL (ref 6–23)
Calcium: 10.7 mg/dL — ABNORMAL HIGH (ref 8.4–10.5)
Creatinine, Ser: 1 mg/dL (ref 0.4–1.2)
GFR: 54.59 mL/min — ABNORMAL LOW (ref >60.00)
Potassium: 4.7 mEq/L (ref 3.5–5.1)

## 2013-07-09 LAB — HEPATIC FUNCTION PANEL
AST: 20 U/L (ref 0–37)
Albumin: 4.4 g/dL (ref 3.5–5.2)
Bilirubin, Direct: 0.1 mg/dL (ref 0.0–0.3)
Total Bilirubin: 0.8 mg/dL (ref 0.3–1.2)

## 2013-07-09 LAB — TSH: TSH: 0.81 u[IU]/mL (ref 0.35–5.50)

## 2013-07-09 LAB — CBC WITH DIFFERENTIAL/PLATELET
Basophils Relative: 0.3 % (ref 0.0–3.0)
Eosinophils Absolute: 0 10*3/uL (ref 0.0–0.7)
Lymphs Abs: 1.2 10*3/uL (ref 0.7–4.0)
MCHC: 33.2 g/dL (ref 30.0–36.0)
MCV: 89.7 fl (ref 78.0–100.0)
Monocytes Absolute: 0.6 10*3/uL (ref 0.1–1.0)
Neutro Abs: 4.1 10*3/uL (ref 1.4–7.7)
Neutrophils Relative %: 69.3 % (ref 43.0–77.0)
RBC: 4.74 Mil/uL (ref 3.87–5.11)

## 2013-07-09 LAB — URINALYSIS, ROUTINE W REFLEX MICROSCOPIC
Leukocytes, UA: NEGATIVE
Specific Gravity, Urine: 1.01 (ref 1.000–1.030)
Urobilinogen, UA: 0.2 (ref 0.0–1.0)

## 2013-07-09 MED ORDER — LORAZEPAM 1 MG PO TABS: 1.0000 mg | ORAL_TABLET | Freq: Every day | ORAL | Status: DC | PRN

## 2013-07-09 NOTE — Telephone Encounter (Signed)
Refilled lorazepam #30 with no refills.   

## 2013-07-09 NOTE — Telephone Encounter (Signed)
Okay to refill? Pt forgot to request refill at her visit this morning.

## 2013-07-09 NOTE — Assessment & Plan Note (Signed)
S/p repair.  Sees Dr Dew.  

## 2013-07-10 ENCOUNTER — Other Ambulatory Visit: Payer: Self-pay | Admitting: Internal Medicine

## 2013-07-10 DIAGNOSIS — E871 Hypo-osmolality and hyponatremia: Secondary | ICD-10-CM

## 2013-07-10 NOTE — Progress Notes (Signed)
Orders placed for f/u labs.  

## 2013-07-12 ENCOUNTER — Encounter: Payer: Self-pay | Admitting: Internal Medicine

## 2013-07-12 NOTE — Assessment & Plan Note (Signed)
Stable.  Sees Dr Gollan.  Continue risk factor modification.  Is s/p MI and stenting.    

## 2013-07-12 NOTE — Assessment & Plan Note (Signed)
Continue to follow up with Dr Dew.   

## 2013-07-12 NOTE — Assessment & Plan Note (Signed)
Avoid nephrotoxins.  Follow renal function and electrolytes.

## 2013-07-12 NOTE — Assessment & Plan Note (Signed)
Seeing Dr Shah.  Just had MRI.  Results as outlined.  Felt medications not warranted.  Follow. Continue to follow up with Dr Shah.   

## 2013-07-12 NOTE — Progress Notes (Signed)
Subjective:    Patient ID: Karen Collins, female    DOB: [birth year], 78 y.o.   MRN: [id number]  HPI 79 year old female with past history of hypertension, hypercholesterolemia, hyperparathyroidism, CAD, chronic kidney disease, renal cell cancer and infrarenal AAA who comes in today for a scheduled follow up.  She is seeing Dr Sherryll Burger for memory issues.  Had MRI 01/09/13 - revealed chronic ischemic changes with no acute ischemic changes.  Did not feel medications would be beneficial.  Writes down things to help her remember.  Has seen Dr Marva Panda.  Has had colonoscopy and EGD.  Has been evaluated by [facility]  Had negative CT (latest) and negative EUS.  Still having some lower abdominal soreness and cramping.  No etiology found.  Has been told has IBS.   Feels like having a period.  Feels similar to menstrual cramps.  Having consistent bowel movements.  Some increased anxiety.  Worse since her mother passed away.  Some decreased interest.  Has to force herself to go out.   No suicidal thoughts.  Feels that the anxiety and stress may contribute to some of the abdominal pain.  Reports some fatigue.   Also evaluated by gyn Magdalen Spatz).  No abnormality found.  Lives by herself.  Doing relatively well with this.  Her sisters help her.     Past Medical History  Diagnosis Date  . Hyperlipidemia   . Hypertension   . CAD (coronary artery disease)   . MI (myocardial infarction)     Non-ST elevation MI  . GI bleed   . Renal cell carcinoma   . Anxiety   . Chronic kidney disease (CKD), stage III (moderate)   . Depression   . GERD (gastroesophageal reflux disease)   . Chronic systolic congestive heart failure     EF of 35 %   . Arthritis   . AAA (abdominal aortic aneurysm)     s/p stent   . Kidney stones   . Diverticulitis     H/O  . Hx of migraines   . Thyroid disease   . Hx: UTI (urinary tract infection)     Outpatient Encounter Prescriptions as of 07/09/2013  Medication Sig Dispense Refill  .  acetaminophen (TYLENOL) 500 MG tablet Take 325 mg by mouth as needed.       Marland Kitchen atorvastatin (LIPITOR) 80 MG tablet Take 1 tablet (80 mg total) by mouth daily.  90 tablet  3  . calcium carbonate (CALCIUM 600) 600 MG TABS Take 600 mg by mouth daily.        . cetirizine (ZYRTEC) 10 MG tablet Take 10 mg by mouth daily.      . Cholecalciferol (VITAMIN D) 400 UNITS capsule Take 400 Units by mouth daily.      . citalopram (CELEXA) 40 MG tablet Take 40 mg by mouth daily.        . diphenhydrAMINE (BENADRYL) 25 MG tablet Take 25 mg by mouth daily as needed.        . docusate sodium (COLACE) 100 MG capsule Take 100 mg by mouth daily as needed.      . Glucosamine-Chondroit-Vit C-Mn (GLUCOSAMINE 1500 COMPLEX) CAPS Take by mouth.        . hyoscyamine (LEVBID) 0.375 MG 12 hr tablet Take 0.375 mg by mouth every 12 (twelve) hours as needed for cramping.      Marland Kitchen ibuprofen (ADVIL,MOTRIN) 600 MG tablet Take 600 mg by mouth as needed.      Marland Kitchen  lisinopril (PRINIVIL,ZESTRIL) 5 MG tablet Take 1 tablet (5 mg total) by mouth daily.  90 tablet  3  . loperamide (ANTI-DIARRHEAL) 2 MG tablet Take 2 mg by mouth as needed.      . Multiple Vitamin (MULTIVITAMIN) tablet Take 1 tablet by mouth daily.        . nitroGLYCERIN (NITROSTAT) 0.4 MG SL tablet Place 0.4 mg under the tongue every 5 (five) minutes as needed.        . Omega-3 Fatty Acids (FISH OIL) 1200 MG CAPS Take 1,200 mg by mouth daily.      . phenylephrine (SUDAFED PE) 10 MG TABS tablet Take 10 mg by mouth every 4 (four) hours as needed.      . simvastatin (ZOCOR) 40 MG tablet Take 40 mg by mouth at bedtime.        . triamterene-hydrochlorothiazide (DYAZIDE) 37.5-25 MG per capsule Take 1 capsule by mouth every morning.        . venlafaxine (EFFEXOR) 75 MG tablet Take 75 mg by mouth 2 (two) times daily.      . vitamin E 400 UNIT capsule Take 400 Units by mouth daily.      Marland Kitchen zolpidem (AMBIEN) 10 MG tablet Take 10 mg by mouth at bedtime as needed.        . [DISCONTINUED]  LORazepam (ATIVAN) 1 MG tablet Take 1 mg by mouth 2 (two) times daily.       . [DISCONTINUED] diphenhydrAMINE (SOMINEX) 25 MG tablet Take 25 mg by mouth as needed.         No facility-administered encounter medications on file as of 07/09/2013.    Review of Systems Patient denies any headache, lightheadedness or dizziness.  No sinus or allergy symptoms.  No chest pain, tightness or palpitations. From a cardiac standpoint - stable and doing well.   No increased shortness of breath, cough or congestion.  No nausea or vomiting.  No acid reflux.  Does report some increased lower abdominal pressure that feels like menstrual cramps.  Bowels stable.  No BRBPR.  No melena.   Some anxiety.   Memory issues.  Seeing Dr Sherryll Burger.  Increased stress and anxiety as outlined.  No suicidal thoughts.       Objective:   Physical Exam  Filed Vitals:   07/09/13 1106  BP: 130/90  Pulse: 102  Temp: 97.9 F (25.25 C)   79 year old female in no acute distress.   HEENT:  Nares- clear.  Oropharynx - without lesions. NECK:  Supple.  Nontender.  No audible bruit.  HEART:  Appears to be regular. LUNGS:  No crackles or wheezing audible.  Respirations even and unlabored.  RADIAL PULSE:  Equal bilaterally.   ABDOMEN:  Soft, nontender.  Bowel sounds present and normal.  No audible abdominal bruit.   EXTREMITIES:  No increased edema present.  DP pulses palpable and equal bilaterally.          Assessment & Plan:  HEALTH MAINTENANCE.  Will schedule her for a physical.  Obtain records.  Last mammogram 12/25/12 - Birads II.  Just had recent colonoscopy.      I spent greater than 40 minutes with the patient and more than 50% of the time was spent in consultation regarding the above.

## 2013-07-12 NOTE — Assessment & Plan Note (Signed)
On atorvastatin.  Low cholesterol diet.  Check lipid panel and liver function.   

## 2013-07-12 NOTE — Assessment & Plan Note (Signed)
Follow cbc.  Has had GI w/up.   

## 2013-07-12 NOTE — Assessment & Plan Note (Signed)
Currently doing well.  Asymptomatic.

## 2013-07-12 NOTE — Assessment & Plan Note (Signed)
Documented history.  Obtain records to review.  Follow.

## 2013-07-12 NOTE — Assessment & Plan Note (Signed)
Persistent abdominal pain and cramping.  Has seen GI.  Evaluated at [facility].  Also evaluated by Magdalen Spatz - gyn.  Unclear as to the exact etiology.  Currently stable.  Reviewed notes.  CT reportedly showed abdominal bowel wall thickening on CT.  The radiologist noted that there was a deformity of the cecum by what appeared to be an external tubular structure causing compression.  There was a question of a possible mucocele.  Has been diagnosed with IBS.  Subsequently had EUS.  No acute abnormality of the cecum identified.  Last CT 06/06/12 - no acute abnormality.  Colonoscopy revealed AVM and diverticula.  EGD normal except AVM.  Will discuss with GI regarding further w/up.  Check urinalysis.

## 2013-07-12 NOTE — Assessment & Plan Note (Signed)
S/p parathyroidectomy.  Follow calcium.  

## 2013-07-12 NOTE — Assessment & Plan Note (Signed)
On citalopram.  Stable.  Follow.   

## 2013-07-12 NOTE — Assessment & Plan Note (Signed)
Followed by Dr Dew.   

## 2013-07-12 NOTE — Assessment & Plan Note (Signed)
Blood pressure doing well.  Check metabolic panel.   

## 2013-07-13 ENCOUNTER — Other Ambulatory Visit (INDEPENDENT_AMBULATORY_CARE_PROVIDER_SITE_OTHER): Payer: Medicare Other

## 2013-07-13 ENCOUNTER — Telehealth: Payer: Self-pay | Admitting: *Deleted

## 2013-07-13 ENCOUNTER — Inpatient Hospital Stay: Payer: Self-pay | Admitting: Internal Medicine

## 2013-07-13 DIAGNOSIS — E871 Hypo-osmolality and hyponatremia: Secondary | ICD-10-CM

## 2013-07-13 LAB — CBC
HCT: 40.1 % (ref 35.0–47.0)
MCV: 88 fL (ref 80–100)
Platelet: 236 10*3/uL (ref 150–440)
RBC: 4.56 10*6/uL (ref 3.80–5.20)
WBC: 4.3 10*3/uL (ref 3.6–11.0)

## 2013-07-13 LAB — URINALYSIS, COMPLETE
Bilirubin,UR: NEGATIVE
Glucose,UR: NEGATIVE mg/dL (ref 0–75)
Leukocyte Esterase: NEGATIVE
Nitrite: NEGATIVE
Ph: 6 (ref 4.5–8.0)
Protein: NEGATIVE
Specific Gravity: 1.006 (ref 1.003–1.030)
Squamous Epithelial: 1

## 2013-07-13 LAB — BASIC METABOLIC PANEL
BUN: 10 mg/dL (ref 6–23)
CO2: 26 mEq/L (ref 19–32)
Calcium: 9.9 mg/dL (ref 8.4–10.5)
GFR: 44.56 mL/min — ABNORMAL LOW (ref >60.00)
Glucose, Bld: 108 mg/dL — ABNORMAL HIGH (ref 70–99)
Potassium: 3.8 mEq/L (ref 3.5–5.1)

## 2013-07-13 LAB — COMPREHENSIVE METABOLIC PANEL
Albumin: 4 g/dL (ref 3.4–5.0)
BUN: 12 mg/dL (ref 7–18)
Bilirubin,Total: 0.4 mg/dL (ref 0.2–1.0)
Chloride: 86 mmol/L — ABNORMAL LOW (ref 98–107)
Glucose: 106 mg/dL — ABNORMAL HIGH (ref 65–99)
Osmolality: 242 (ref 275–301)
Potassium: 4.1 mmol/L (ref 3.5–5.1)
SGOT(AST): 20 U/L (ref 15–37)
SGPT (ALT): 24 U/L (ref 12–78)
Sodium: 120 mmol/L — CL (ref 136–145)

## 2013-07-13 LAB — TSH: Thyroid Stimulating Horm: 3.02 u[IU]/mL

## 2013-07-13 LAB — TROPONIN I: Troponin-I: 0.04 ng/mL

## 2013-07-13 NOTE — Telephone Encounter (Signed)
Critical Sodium: 121

## 2013-07-13 NOTE — Telephone Encounter (Signed)
Called to speak to Octavia Heir about her sodium.  I spoke to her sister.  She is already on her way to the ER.  Discussed with sister Johnny Bridge.  Will keep me posted.

## 2013-07-13 NOTE — Telephone Encounter (Signed)
Pt would like to speak with you. She states that she was feeling fine & she just came in from walking around the block a few times. After I informed her of her labs & recommended that she go to the ER, she became very anxious (feels that she is not having an attack).

## 2013-07-13 NOTE — Telephone Encounter (Signed)
Given the continued decrease in sodium and decreased appetite, etc - needs evaluation.  Needs to go to ER for evaluation.

## 2013-07-14 LAB — BASIC METABOLIC PANEL
Anion Gap: 8 (ref 7–16)
BUN: 9 mg/dL (ref 7–18)
Calcium, Total: 9.5 mg/dL (ref 8.5–10.1)
Co2: 24 mmol/L (ref 21–32)
Creatinine: 0.97 mg/dL (ref 0.60–1.30)
EGFR (African American): >60
EGFR (Non-African Amer.): 56 — ABNORMAL LOW
Glucose: 99 mg/dL (ref 65–99)
Osmolality: 248 (ref 275–301)
Potassium: 3.3 mmol/L — ABNORMAL LOW (ref 3.5–5.1)
Sodium: 124 mmol/L — ABNORMAL LOW (ref 136–145)

## 2013-07-14 LAB — OSMOLALITY: Osmolality: 253 mOsm/kg — ABNORMAL LOW (ref 280–301)

## 2013-07-14 LAB — OSMOLALITY, URINE: Osmolality: 193 mOsm/kg

## 2013-07-14 NOTE — Telephone Encounter (Signed)
Labs were faxed to the ER yesterday afternoon

## 2013-07-15 LAB — BASIC METABOLIC PANEL
BUN: 13 mg/dL (ref 7–18)
Calcium, Total: 8.9 mg/dL (ref 8.5–10.1)
Chloride: 95 mmol/L — ABNORMAL LOW (ref 98–107)
Co2: 22 mmol/L (ref 21–32)
Creatinine: 1.03 mg/dL (ref 0.60–1.30)
EGFR (Non-African Amer.): 52 — ABNORMAL LOW
Glucose: 82 mg/dL (ref 65–99)
Osmolality: 249 (ref 275–301)
Sodium: 124 mmol/L — ABNORMAL LOW (ref 136–145)

## 2013-07-15 LAB — URIC ACID: Uric Acid: 3.1 mg/dL (ref 2.6–6.0)

## 2013-07-16 LAB — BASIC METABOLIC PANEL
Anion Gap: 7 (ref 7–16)
BUN: 12 mg/dL (ref 7–18)
Chloride: 101 mmol/L (ref 98–107)
Creatinine: 0.92 mg/dL (ref 0.60–1.30)
EGFR (Non-African Amer.): >60
Glucose: 86 mg/dL (ref 65–99)
Potassium: 4.1 mmol/L (ref 3.5–5.1)
Sodium: 130 mmol/L — ABNORMAL LOW (ref 136–145)

## 2013-07-16 LAB — SODIUM
Sodium: 130 mmol/L — ABNORMAL LOW (ref 136–145)
Sodium: 132 mmol/L — ABNORMAL LOW (ref 136–145)
Sodium: 133 mmol/L — ABNORMAL LOW (ref 136–145)
Sodium: 130 mmol/L — ABNORMAL LOW (ref 136–145)

## 2013-07-17 LAB — BASIC METABOLIC PANEL
Anion Gap: 7 (ref 7–16)
BUN: 15 mg/dL (ref 7–18)
Chloride: 103 mmol/L (ref 98–107)
Co2: 23 mmol/L (ref 21–32)
Creatinine: 0.98 mg/dL (ref 0.60–1.30)
EGFR (African American): >60
EGFR (Non-African Amer.): 56 — ABNORMAL LOW
Osmolality: 266 (ref 275–301)
Potassium: 4 mmol/L (ref 3.5–5.1)

## 2013-07-17 LAB — SODIUM: Sodium: 130 mmol/L — ABNORMAL LOW (ref 136–145)

## 2013-07-22 ENCOUNTER — Encounter: Payer: Self-pay | Admitting: *Deleted

## 2013-07-22 DIAGNOSIS — F419 Anxiety disorder, unspecified: Secondary | ICD-10-CM | POA: Insufficient documentation

## 2013-07-22 NOTE — Addendum Note (Signed)
Addended by: Charm Barges on: 07/22/2013 05:59 PM   Modules accepted: Orders

## 2013-07-22 NOTE — Assessment & Plan Note (Signed)
She is having increased anxiety.  Discussed at length with her and her sister.  Will refer to psych for further evaluation and treatment.  She is in agreement.

## 2013-07-23 ENCOUNTER — Encounter: Payer: Self-pay | Admitting: Adult Health

## 2013-07-23 ENCOUNTER — Ambulatory Visit (INDEPENDENT_AMBULATORY_CARE_PROVIDER_SITE_OTHER): Payer: Medicare Other | Admitting: Adult Health

## 2013-07-23 VITALS — BP 118/74 | HR 98 | Temp 98.2°F | Resp 14 | Wt 144.0 lb

## 2013-07-23 DIAGNOSIS — E871 Hypo-osmolality and hyponatremia: Secondary | ICD-10-CM

## 2013-07-23 DIAGNOSIS — Z09 Encounter for follow-up examination after completed treatment for conditions other than malignant neoplasm: Secondary | ICD-10-CM

## 2013-07-23 LAB — BASIC METABOLIC PANEL
Calcium: 10.1 mg/dL (ref 8.4–10.5)
Chloride: 96 mEq/L (ref 96–112)
Creatinine, Ser: 1.1 mg/dL (ref 0.4–1.2)
GFR: 53.4 mL/min — ABNORMAL LOW (ref >60.00)
Glucose, Bld: 93 mg/dL (ref 70–99)

## 2013-07-23 MED ORDER — NITROGLYCERIN 0.4 MG SL SUBL: 0.4000 mg | SUBLINGUAL_TABLET | SUBLINGUAL | Status: DC | PRN

## 2013-07-23 NOTE — Assessment & Plan Note (Signed)
Patient is status post hospitalization at [facility] from 07/13/2013 through 07/17/2013 for hyponatremia. She is also status post consultation with nephrology. Sodium at discharge was 132 mg/dL. Check basic metabolic panel today. Patient is able to perform ADLs. Having assistance from her sisters with IADLs such as meal preparation, grocery shopping and medication management. Note, spent 45 minutes with patient and family in reviewing recent hospital records, medications (there was some confusion with this) reviewed in detail, assessment, evaluation and implementation of care. Allow time to answer family's questions. Patient will followup with PCP in 2 weeks for GI and other chronic problems.

## 2013-07-23 NOTE — Progress Notes (Signed)
Subjective:    Patient ID: Karen Collins, female    DOB: [birth year], 79 y.o.   MRN: [id number]  HPI  Patient is a pleasant 79 year old female who presents to clinic for followup recent hospitalization at [facility] from 07/13/2013 2 07/17/2013 for hyponatremia. During hospitalization she was seen by nephrologist, Dr. Cherylann Ratel. She has a followup appointment with him on 08/03/2013. Hydrochlorothiazide and triamterene have been discontinued. Patient has multiple medical problems including coronary artery disease, hypertension, hyperlipidemia, IBS, AAA status post repair, right nephrectomy secondary to RCC. Patient presents to clinic with 2 sisters. Patient reports that she is living alone and believes she is doing well. She is still slightly weak and slowly recovering. She reports 100% ability to perform ADLs. She is requiring some assistance with IADLs such as medication administration, driving and grocery shopping. Her sisters reports that patient is having some confusion with her medication but is refusing that they set up a pill box or change the way that she manages her medications. The sisters monitor medications pt has been taking. They present to clinic today with a list of her medications status post discharge. Patient reports that her appetite is slightly improving. She is still having abdominal discomfort with occasional bouts of diarrhea. She will followup with Dr. Lorin Picket in approximately 2 weeks to discuss this further. She has loperamide available for diarrhea.    Past Medical History  Diagnosis Date  . Hyperlipidemia   . Hypertension   . CAD (coronary artery disease)   . MI (myocardial infarction)     Non-ST elevation MI  . GI bleed   . Renal cell carcinoma   . Anxiety   . Chronic kidney disease (CKD), stage III (moderate)   . Depression   . GERD (gastroesophageal reflux disease)   . Chronic systolic congestive heart failure     EF of 35 %   . Arthritis   . AAA (abdominal aortic  aneurysm)     s/p stent   . Kidney stones   . Diverticulitis     H/O  . Hx of migraines   . Thyroid disease   . Hx: UTI (urinary tract infection)      Past Surgical History  Procedure Laterality Date  . Cardiac catheterization    . Cardiac catheterization  09/18/2010  . Coronary stent placement  09/18/2010    CAD; MI s/p stent  . Nephrectomy  10/2008    Right  . Shoulder surgery  07/2010    Right  . Abdominal aortic aneurysm repair      s/p stent  . Cholecystectomy    . Thyroidectomy    . Laparoscopic partial nephrectomy  2010  . Parathyroidectomy    . Abdominal aortic endovascular stent graft       Family History  Problem Relation Age of Onset  . Arthritis Mother   . Heart disease Mother   . Pulmonary fibrosis Mother   . Heart disease Father   . Hodgkin's lymphoma Father   . Breast cancer Sister   . Hyperlipidemia Sister      History   Social History  . Marital Status: Divorced    Spouse Name: N/A    Number of Children: N/A  . Years of Education: N/A   Occupational History  . Retired    Social History Main Topics  . Smoking status: Former Smoker -- 1.00 packs/day for 30 years    Quit date: 10/01/2005  . Smokeless tobacco: Never Used  . Alcohol Use: 1.5 oz/week  3 drink(s) per week     Comment: Wine 2-3 times weekly  . Drug Use: No  . Sexual Activity: Not on file   Other Topics Concern  . Not on file   Social History Narrative   Married   Walks occasionally with house work     Review of Systems  Constitutional: Positive for fatigue.  HENT: Negative.   Respiratory: Negative.   Cardiovascular: Negative for chest pain, palpitations and leg swelling.  Gastrointestinal: Positive for diarrhea. Negative for nausea.       Abdominal cramping. Occasional bouts of diarrhea  Genitourinary: Negative.   Musculoskeletal: Positive for gait problem.       Reports slightly unsteady gait secondary to weakness  Neurological: Positive for weakness.  Negative for dizziness, light-headedness and headaches.  Psychiatric/Behavioral: Positive for confusion. Negative for hallucinations, behavioral problems and agitation. The patient is nervous/anxious.   All other systems reviewed and are negative.       Objective:   Physical Exam  Constitutional:  79 year old female in no apparent distress. Appearing slightly weakened s/p hospitalization  Cardiovascular: Normal rate, regular rhythm, normal heart sounds and intact distal pulses.  Exam reveals no gallop.   No murmur heard. Pulmonary/Chest: Effort normal and breath sounds normal. No respiratory distress. She has no wheezes. She has no rales.  Abdominal: Soft.  Hyperactive bowel sounds throughout  Musculoskeletal: Normal range of motion. She exhibits no edema.  Neurological: She is alert.  Some confusion with medications.  Skin: Skin is warm and dry.  Psychiatric: She has a normal mood and affect. Her behavior is normal.          Assessment & Plan:

## 2013-07-27 ENCOUNTER — Telehealth: Payer: Self-pay | Admitting: *Deleted

## 2013-07-27 NOTE — Telephone Encounter (Signed)
I do not mind talking to the family, but will need release signed to speak to son.  Please call marti and let her know I need form.  If they can get her to sign.  Thanks.

## 2013-07-27 NOTE — Telephone Encounter (Signed)
Notified Glenda & she will bring Ms. Karen Collins by tomorrow to sign new DPR

## 2013-07-27 NOTE — Telephone Encounter (Signed)
Pt's son would like to speak with Dr. Lorin Picket urgently about having his mother taken to a nursing home. He states that she is being stubborn & does not want to go & he feels that someone will have to basically force her to go. He would like to speak with you personally.

## 2013-07-27 NOTE — Telephone Encounter (Signed)
Noted  

## 2013-08-03 ENCOUNTER — Ambulatory Visit: Payer: Self-pay | Admitting: Nephrology

## 2013-08-04 ENCOUNTER — Ambulatory Visit (INDEPENDENT_AMBULATORY_CARE_PROVIDER_SITE_OTHER): Payer: Medicare Other | Admitting: Internal Medicine

## 2013-08-04 ENCOUNTER — Ambulatory Visit: Payer: Medicare Other | Admitting: Internal Medicine

## 2013-08-04 ENCOUNTER — Ambulatory Visit (INDEPENDENT_AMBULATORY_CARE_PROVIDER_SITE_OTHER): Payer: No Typology Code available for payment source | Admitting: Psychology

## 2013-08-04 ENCOUNTER — Encounter: Payer: Self-pay | Admitting: Internal Medicine

## 2013-08-04 VITALS — BP 112/66 | HR 87 | Temp 97.9°F | Resp 12 | Wt 145.0 lb

## 2013-08-04 DIAGNOSIS — E785 Hyperlipidemia, unspecified: Secondary | ICD-10-CM

## 2013-08-04 DIAGNOSIS — Z139 Encounter for screening, unspecified: Secondary | ICD-10-CM

## 2013-08-04 DIAGNOSIS — F411 Generalized anxiety disorder: Secondary | ICD-10-CM

## 2013-08-04 DIAGNOSIS — I868 Varicose veins of other specified sites: Secondary | ICD-10-CM

## 2013-08-04 DIAGNOSIS — N183 Chronic kidney disease, stage 3 unspecified: Secondary | ICD-10-CM

## 2013-08-04 DIAGNOSIS — F32A Depression, unspecified: Secondary | ICD-10-CM

## 2013-08-04 DIAGNOSIS — I2510 Atherosclerotic heart disease of native coronary artery without angina pectoris: Secondary | ICD-10-CM

## 2013-08-04 DIAGNOSIS — I714 Abdominal aortic aneurysm, without rupture, unspecified: Secondary | ICD-10-CM

## 2013-08-04 DIAGNOSIS — F419 Anxiety disorder, unspecified: Secondary | ICD-10-CM

## 2013-08-04 DIAGNOSIS — F329 Major depressive disorder, single episode, unspecified: Secondary | ICD-10-CM

## 2013-08-04 DIAGNOSIS — D509 Iron deficiency anemia, unspecified: Secondary | ICD-10-CM

## 2013-08-04 DIAGNOSIS — I10 Essential (primary) hypertension: Secondary | ICD-10-CM

## 2013-08-04 DIAGNOSIS — R109 Unspecified abdominal pain: Secondary | ICD-10-CM

## 2013-08-04 DIAGNOSIS — N760 Acute vaginitis: Secondary | ICD-10-CM

## 2013-08-04 DIAGNOSIS — E213 Hyperparathyroidism, unspecified: Secondary | ICD-10-CM

## 2013-08-04 DIAGNOSIS — C649 Malignant neoplasm of unspecified kidney, except renal pelvis: Secondary | ICD-10-CM

## 2013-08-04 DIAGNOSIS — R413 Other amnesia: Secondary | ICD-10-CM

## 2013-08-04 NOTE — Progress Notes (Signed)
Pre-visit discussion using our clinic review tool. No additional management support is needed unless otherwise documented below in the visit note.  

## 2013-08-05 LAB — WET PREP BY MOLECULAR PROBE
Candida species: NEGATIVE
Trichomonas vaginosis: NEGATIVE

## 2013-08-06 ENCOUNTER — Encounter: Payer: Self-pay | Admitting: *Deleted

## 2013-08-09 ENCOUNTER — Encounter: Payer: Self-pay | Admitting: Internal Medicine

## 2013-08-09 DIAGNOSIS — N760 Acute vaginitis: Secondary | ICD-10-CM | POA: Insufficient documentation

## 2013-08-09 NOTE — Assessment & Plan Note (Signed)
Documented history.  Obtain records to review.  Follow.  

## 2013-08-09 NOTE — Assessment & Plan Note (Signed)
Avoid nephrotoxins.  Follow renal function and electrolytes.  Just saw nephrology yesterday.  Cr 1.03.

## 2013-08-09 NOTE — Assessment & Plan Note (Signed)
Seeing Dr Sherryll Burger.  Just had MRI.  Results as outlined.  Felt medications not warranted.  Follow. Continue to follow up with Dr Sherryll Burger.

## 2013-08-09 NOTE — Assessment & Plan Note (Signed)
On atorvastatin.  Low cholesterol diet.  Check lipid panel and liver function.   

## 2013-08-09 NOTE — Assessment & Plan Note (Signed)
Persistent abdominal pain and cramping.  Has seen GI.  Evaluated at [facility].  Also evaluated by Magdalen Spatz - gyn.  Unclear as to the exact etiology.  Currently stable.  Reviewed notes.  CT reportedly showed abdominal bowel wall thickening on CT.  The radiologist noted that there was a deformity of the cecum by what appeared to be an external tubular structure causing compression.  There was a question of a possible mucocele.  Has been diagnosed with IBS.  Subsequently had EUS.  No acute abnormality of the cecum identified.  Last CT 06/06/12 - no acute abnormality.  Colonoscopy revealed AVM and diverticula.  EGD normal except AVM.  Discussed referral back to GI.  Will hold on further w/up or referral at this time.

## 2013-08-09 NOTE — Assessment & Plan Note (Signed)
Followed by Dr Dew.   

## 2013-08-09 NOTE — Assessment & Plan Note (Signed)
Pelvic today.  KOH and wet prep today.

## 2013-08-09 NOTE — Assessment & Plan Note (Signed)
She is having increased anxiety.  Discussed at length with her and her sister.  On citalopram.  Due to see psych today.  Follow.

## 2013-08-09 NOTE — Assessment & Plan Note (Signed)
Stable.  Sees Dr Gollan.  Continue risk factor modification.  Is s/p MI and stenting.    

## 2013-08-09 NOTE — Progress Notes (Signed)
Subjective:    Patient ID: Karen Collins, female    DOB: 06-Jun-1934, 79 y.o.   MRN: [id number]  HPI 79 year old female with past history of hypertension, hypercholesterolemia, hyperparathyroidism, CAD, chronic kidney disease, renal cell cancer and infrarenal AAA who comes in today for a scheduled follow up.  She is seeing Dr Sherryll Burger for memory issues.  Had MRI 01/09/13 - revealed chronic ischemic changes with no acute ischemic changes.  Did not feel medications would be beneficial.  Writes down things to help her remember.  Has seen Dr Marva Panda.  Has had colonoscopy and EGD.  Has been evaluated by [facility].  Had negative CT (latest) and negative EUS.  Still having some lower abdominal soreness and cramping.  No etiology found.  Has been told has IBS.   Feels like having a period.  Feels similar to menstrual cramps.  Having consistent bowel movements.  Some increased anxiety.  Worse since her mother passed away.  Some decreased interest.  Has to force herself to go out.   No suicidal thoughts.  Is due to see psychiatry today.   Also evaluated by gyn Magdalen Spatz).  No abnormality found.  She does report some vaginal irritation today.  She is accompanied by her sister.  History obtained from both of them.    She was hospitalized recently with hyponatremia.  This was corrected with 3% saline.  She was found to have a low urine osmolality and low urine sodium.  Had previously been eating and tea and toast type diet.  This has improved some. We discussed this at length today.  Just saw nephrology yesterday.  Sodium back wnl.      Past Medical History  Diagnosis Date  . Hyperlipidemia   . Hypertension   . CAD (coronary artery disease)   . MI (myocardial infarction)     Non-ST elevation MI  . GI bleed   . Renal cell carcinoma   . Anxiety   . Chronic kidney disease (CKD), stage III (moderate)   . Depression   . GERD (gastroesophageal reflux disease)   . Chronic systolic congestive heart failure     EF  of 35 %   . Arthritis   . AAA (abdominal aortic aneurysm)     s/p stent   . Kidney stones   . Diverticulitis     H/O  . Hx of migraines   . Thyroid disease   . Hx: UTI (urinary tract infection)     Outpatient Encounter Prescriptions as of 08/04/2013  Medication Sig  . acetaminophen (TYLENOL) 500 MG tablet Take 325 mg by mouth as needed.   . calcium carbonate (CALCIUM 600) 600 MG TABS Take 600 mg by mouth daily.    . cetirizine (ZYRTEC) 10 MG tablet Take 10 mg by mouth daily.  . Cholecalciferol (VITAMIN D) 400 UNITS capsule Take 400 Units by mouth daily.  . citalopram (CELEXA) 40 MG tablet Take 40 mg by mouth daily.    . diphenhydrAMINE (BENADRYL) 25 MG tablet Take 25 mg by mouth daily as needed.    . docusate sodium (COLACE) 100 MG capsule Take 100 mg by mouth daily as needed.  . Glucosamine-Chondroit-Vit C-Mn (GLUCOSAMINE 1500 COMPLEX) CAPS Take by mouth.    . hyoscyamine (LEVBID) 0.375 MG 12 hr tablet Take 0.375 mg by mouth every 12 (twelve) hours as needed for cramping.  Marland Kitchen ibuprofen (ADVIL,MOTRIN) 600 MG tablet Take 600 mg by mouth as needed.  Marland Kitchen lisinopril (PRINIVIL,ZESTRIL) 5 MG tablet Take  1 tablet (5 mg total) by mouth daily.  Marland Kitchen loperamide (ANTI-DIARRHEAL) 2 MG tablet Take 2 mg by mouth as needed.  Marland Kitchen LORazepam (ATIVAN) 1 MG tablet Take 1 tablet (1 mg total) by mouth daily as needed for anxiety.  . Multiple Vitamin (MULTIVITAMIN) tablet Take 1 tablet by mouth daily.    . nitroGLYCERIN (NITROSTAT) 0.4 MG SL tablet Place 1 tablet (0.4 mg total) under the tongue every 5 (five) minutes as needed.  . Omega-3 Fatty Acids (FISH OIL) 1200 MG CAPS Take 1,200 mg by mouth daily.  . phenylephrine (SUDAFED PE) 10 MG TABS tablet Take 10 mg by mouth every 4 (four) hours as needed.  . simvastatin (ZOCOR) 40 MG tablet Take 40 mg by mouth at bedtime.    Marland Kitchen venlafaxine (EFFEXOR) 75 MG tablet Take 75 mg by mouth 2 (two) times daily.  . vitamin E 400 UNIT capsule Take 400 Units by mouth daily.  Marland Kitchen  zolpidem (AMBIEN) 10 MG tablet Take 10 mg by mouth at bedtime as needed.      Review of Systems Patient denies any headache, lightheadedness or dizziness.  No sinus or allergy symptoms.  No chest pain, tightness or palpitations. From a cardiac standpoint - stable and doing well.   No increased shortness of breath, cough or congestion.  No nausea or vomiting.  No acid reflux.  Does report some increased lower abdominal pressure that feels like menstrual cramps.  Bowels stable.  No BRBPR.  No melena.   Some anxiety.   Memory issues.  Seeing Dr Sherryll Burger.  Increased stress and anxiety as outlined.  No suicidal thoughts.  Due to see psych today.  Discussed diet.  See above.  Vaginal irritation as outlined.       Objective:   Physical Exam  Filed Vitals:   08/04/13 1211  BP: 112/66  Pulse: 87  Temp: 97.9 F (36.6 C)  Resp: 79   79 year old female in no acute distress.   HEENT:  Nares- clear.  Oropharynx - without lesions. NECK:  Supple.  Nontender.  No audible bruit.  HEART:  Appears to be regular. LUNGS:  No crackles or wheezing audible.  Respirations even and unlabored.  RADIAL PULSE:  Equal bilaterally.  ABDOMEN:  Soft, nontender.  Bowel sounds present and normal.  No audible abdominal bruit.  GU:  Normal external genitalia.  Vaginal vault without lesions.  Could not appreciate any adnexal masses or tenderness. Some discharge present.  Sent off KOH/wet prep.   EXTREMITIES:  No increased edema present.  DP pulses palpable and equal bilaterally.          Assessment & Plan:  HEALTH MAINTENANCE.  She is scheduled for a physical.  Last mammogram 12/25/12 - Birads II.  Just had recent colonoscopy.      I spent greater than 40 minutes with the patient and more than 50% of the time was spent in consultation regarding the above.

## 2013-08-09 NOTE — Assessment & Plan Note (Signed)
Blood pressure doing well.  Off hctz now.  Follow.   

## 2013-08-09 NOTE — Assessment & Plan Note (Signed)
S/p parathyroidectomy.  Recent calcium/ionized calcium elevated.  Intact PTH elevated.  This was discussed at length with her and her sister.  Recent calcium - 10.4.  Will hold on further w/up or referral to surgery (at this time).  Need to get some of her anxiety issues straightened out first.  She is in agreement.  As long as can keep her calcium below 11, will hold on further w/up for now.  Hold until can get anxiety under control.

## 2013-08-09 NOTE — Assessment & Plan Note (Signed)
On citalopram.  With increased anxiety.  Due to see psychiatry today.  Follow.  No suicidal ideations.

## 2013-08-09 NOTE — Assessment & Plan Note (Signed)
S/p repair.  Sees Dr Dew.  

## 2013-08-09 NOTE — Assessment & Plan Note (Signed)
Follow cbc.  Has had GI w/up.   

## 2013-08-10 ENCOUNTER — Other Ambulatory Visit: Payer: Self-pay | Admitting: Internal Medicine

## 2013-08-10 NOTE — Telephone Encounter (Signed)
Duplicate - see message.

## 2013-08-10 NOTE — Telephone Encounter (Signed)
I am ok to refill this if her psychiatrist did not change her medication.  She saw her psychiatrist last week for the first time.  Need to know if any med changes or are they going to start refilling.  Please call sister and ask.  If I need to I will refill.

## 2013-08-10 NOTE — Telephone Encounter (Signed)
In 08/04/13, ok to refill?

## 2013-08-10 NOTE — Telephone Encounter (Signed)
Pt has called also requesting a refill. Last filled on 07/09/13

## 2013-08-12 NOTE — Telephone Encounter (Signed)
LMTCB

## 2013-08-12 NOTE — Telephone Encounter (Signed)
See message.  Please call marti and confirm medication and who is filling.  Still taking?  Still taking this dose? Let me know if any problems.

## 2013-08-12 NOTE — Telephone Encounter (Signed)
See messaging before refilling.  Thanks.

## 2013-08-13 NOTE — Telephone Encounter (Signed)
Refilled lorazepam #30 with one refill.   

## 2013-08-13 NOTE — Telephone Encounter (Signed)
Pt called today wanting to know why we haven't sent in her Lorazepam. She only has 3 pills left. She states that she takes the 1mg  daily. She becomes anxious when she is afraid she is going to run out. I told her that I left her a message yesterday & that's why it is important to update her medications every visit. It was not on her med list. Okay to refill?

## 2013-08-18 ENCOUNTER — Ambulatory Visit (INDEPENDENT_AMBULATORY_CARE_PROVIDER_SITE_OTHER): Payer: No Typology Code available for payment source | Admitting: Psychology

## 2013-08-18 DIAGNOSIS — F411 Generalized anxiety disorder: Secondary | ICD-10-CM

## 2013-08-26 ENCOUNTER — Ambulatory Visit (INDEPENDENT_AMBULATORY_CARE_PROVIDER_SITE_OTHER): Payer: No Typology Code available for payment source | Admitting: Psychology

## 2013-08-26 DIAGNOSIS — F411 Generalized anxiety disorder: Secondary | ICD-10-CM

## 2013-09-18 ENCOUNTER — Encounter: Payer: Self-pay | Admitting: Internal Medicine

## 2013-09-28 ENCOUNTER — Ambulatory Visit (INDEPENDENT_AMBULATORY_CARE_PROVIDER_SITE_OTHER): Payer: Medicare Other | Admitting: Internal Medicine

## 2013-09-28 ENCOUNTER — Encounter (INDEPENDENT_AMBULATORY_CARE_PROVIDER_SITE_OTHER): Payer: Self-pay

## 2013-09-28 ENCOUNTER — Encounter: Payer: Self-pay | Admitting: Internal Medicine

## 2013-09-28 VITALS — BP 126/82 | HR 82 | Temp 98.3°F | Ht 62.5 in | Wt 148.0 lb

## 2013-09-28 DIAGNOSIS — E213 Hyperparathyroidism, unspecified: Secondary | ICD-10-CM

## 2013-09-28 DIAGNOSIS — R413 Other amnesia: Secondary | ICD-10-CM

## 2013-09-28 DIAGNOSIS — E785 Hyperlipidemia, unspecified: Secondary | ICD-10-CM

## 2013-09-28 DIAGNOSIS — Z8719 Personal history of other diseases of the digestive system: Secondary | ICD-10-CM

## 2013-09-28 DIAGNOSIS — I868 Varicose veins of other specified sites: Secondary | ICD-10-CM

## 2013-09-28 DIAGNOSIS — R109 Unspecified abdominal pain: Secondary | ICD-10-CM

## 2013-09-28 DIAGNOSIS — K922 Gastrointestinal hemorrhage, unspecified: Secondary | ICD-10-CM

## 2013-09-28 DIAGNOSIS — F419 Anxiety disorder, unspecified: Secondary | ICD-10-CM

## 2013-09-28 DIAGNOSIS — C649 Malignant neoplasm of unspecified kidney, except renal pelvis: Secondary | ICD-10-CM

## 2013-09-28 DIAGNOSIS — F411 Generalized anxiety disorder: Secondary | ICD-10-CM

## 2013-09-28 DIAGNOSIS — D509 Iron deficiency anemia, unspecified: Secondary | ICD-10-CM

## 2013-09-28 DIAGNOSIS — I2510 Atherosclerotic heart disease of native coronary artery without angina pectoris: Secondary | ICD-10-CM

## 2013-09-28 DIAGNOSIS — F329 Major depressive disorder, single episode, unspecified: Secondary | ICD-10-CM

## 2013-09-28 DIAGNOSIS — I714 Abdominal aortic aneurysm, without rupture: Secondary | ICD-10-CM

## 2013-09-28 DIAGNOSIS — I10 Essential (primary) hypertension: Secondary | ICD-10-CM

## 2013-09-28 DIAGNOSIS — I6529 Occlusion and stenosis of unspecified carotid artery: Secondary | ICD-10-CM

## 2013-09-28 MED ORDER — LORAZEPAM 1 MG PO TABS: 1.0000 mg | ORAL_TABLET | Freq: Every day | ORAL | Status: DC | PRN

## 2013-09-28 MED ORDER — CITALOPRAM HYDROBROMIDE 40 MG PO TABS: 40.0000 mg | ORAL_TABLET | Freq: Every day | ORAL | Status: DC

## 2013-10-01 ENCOUNTER — Encounter: Payer: Self-pay | Admitting: Internal Medicine

## 2013-10-01 NOTE — Assessment & Plan Note (Signed)
Follow cbc.  Has had GI w/up.   

## 2013-10-01 NOTE — Assessment & Plan Note (Signed)
Stable.  Sees Dr Gollan.  Continue risk factor modification.  Is s/p MI and stenting.    

## 2013-10-01 NOTE — Assessment & Plan Note (Signed)
She is having increased anxiety.  Discussed at length with her and her sister.  On citalopram.  Saw psychiatry.  On lorazepam.  Better.  Follow.

## 2013-10-01 NOTE — Assessment & Plan Note (Signed)
Blood pressure doing well.  Off hctz now.  Follow.

## 2013-10-01 NOTE — Assessment & Plan Note (Signed)
Seeing Dr Shah.  Just had MRI.  Results as outlined.  Initially felt medications not warranted.  Follow. Continue to follow up with Dr Shah.  Apparently may be contemplating medications now.  Follow.    

## 2013-10-01 NOTE — Assessment & Plan Note (Signed)
Worked up by Dr Skulskie.  Colonoscopy 2012 - diverticula and AVM.  EGD 11/2010 - normal except for AVM.    

## 2013-10-01 NOTE — Assessment & Plan Note (Signed)
On atorvastatin.  Low cholesterol diet.  Check lipid panel and liver function.

## 2013-10-01 NOTE — Assessment & Plan Note (Signed)
Continue to follow up with Dr Lucky Cowboy.

## 2013-10-01 NOTE — Assessment & Plan Note (Signed)
Avoid nephrotoxins.  Follow renal function and electrolytes.  Seeing nephrology yesterday.

## 2013-10-01 NOTE — Assessment & Plan Note (Signed)
On citalopram.  With increased anxiety.  Saw psychiatry.  Made no changes.  Desires not to follow up.  Doing better.  Follow.

## 2013-10-01 NOTE — Assessment & Plan Note (Signed)
Persistent abdominal pain and cramping.  Has seen GI.  Evaluated at [facility].  Also evaluated by Luna Kitchens - gyn.  Unclear as to the exact etiology.  Currently stable.  Reviewed notes.  CT reportedly showed abdominal bowel wall thickening on CT.  The radiologist noted that there was a deformity of the cecum by what appeared to be an external tubular structure causing compression.  There was a question of a possible mucocele.  Has been diagnosed with IBS.  Subsequently had EUS.  No acute abnormality of the cecum identified.  Last CT 06/06/12 - no acute abnormality.  Colonoscopy revealed AVM and diverticula.  EGD normal except AVM.  Has follow up with GI soon.

## 2013-10-01 NOTE — Assessment & Plan Note (Signed)
S/p repair.  Sees Dr Dew.  

## 2013-10-01 NOTE — Assessment & Plan Note (Signed)
Followed by Dr Dew.   

## 2013-10-01 NOTE — Progress Notes (Signed)
Subjective:    Patient ID: Karen Collins, female    DOB: [birth year], 80 y.o.   MRN: [id number]  HPI 80 year old female with past history of hypertension, hypercholesterolemia, hyperparathyroidism, CAD, chronic kidney disease, renal cell cancer and infrarenal AAA who comes in today to follow up on these issues as well as for a complete physical exam.  She is seeing Dr Manuella Ghazi for memory issues.  Had MRI 01/09/13 - revealed chronic ischemic changes with no acute ischemic changes.  Did not feel medications would be beneficial initially.  Apparently may be thinking of medication now.  She writes down things to help her remember.  Has seen Dr Gustavo Lah.  Has had colonoscopy and EGD.  Has been evaluated by [facility].  Had negative CT (latest) and negative EUS.  Still having some lower abdominal soreness and cramping.  No etiology found.  Has been told has IBS.   Feels like having a period.  Feels similar to menstrual cramps. Having consistent bowel movements.  Some increased anxiety.  Worse since her mother passed away.  Some decreased interest.  Has to force herself to go out.   This has improved.  No suicidal thoughts.  Has seen psychiatry.  went on three separate occasions.  No medication changes made.    Also evaluated by gyn Luna Kitchens).  No abnormality found.   She is accompanied by her sister.  History obtained from both of them.  Doing better - regarding the anxiety.    She was hospitalized recently with hyponatremia.  This was corrected with 3% saline.  She was found to have a low urine osmolality and low urine sodium.  Had previously been eating and tea and toast type diet.  This has improved.  Diet has improved.  Seeing nephrology.  Sodium normal now.       Past Medical History  Diagnosis Date  . Hyperlipidemia   . Hypertension   . CAD (coronary artery disease)   . MI (myocardial infarction)     Non-ST elevation MI  . GI bleed   . Renal cell carcinoma   . Anxiety   . Chronic kidney disease  (CKD), stage III (moderate)   . Depression   . GERD (gastroesophageal reflux disease)   . Chronic systolic congestive heart failure     EF of 35 %   . Arthritis   . AAA (abdominal aortic aneurysm)     s/p stent   . Kidney stones   . Diverticulitis     H/O  . Hx of migraines   . Thyroid disease   . Hx: UTI (urinary tract infection)     Outpatient Encounter Prescriptions as of 09/28/2013  Medication Sig  . acetaminophen (TYLENOL) 500 MG tablet Take 325 mg by mouth as needed.   . calcium carbonate (CALCIUM 600) 600 MG TABS Take 600 mg by mouth daily.    . cetirizine (ZYRTEC) 10 MG tablet Take 10 mg by mouth daily.  . Cholecalciferol (VITAMIN D) 400 UNITS capsule Take 400 Units by mouth daily.  . citalopram (CELEXA) 40 MG tablet Take 1 tablet (40 mg total) by mouth daily.  . diphenhydrAMINE (BENADRYL) 25 MG tablet Take 25 mg by mouth daily as needed.    . docusate sodium (COLACE) 100 MG capsule Take 100 mg by mouth daily as needed.  . Glucosamine-Chondroit-Vit C-Mn (GLUCOSAMINE 1500 COMPLEX) CAPS Take by mouth.    . hyoscyamine (LEVBID) 0.375 MG 12 hr tablet Take 0.375 mg by mouth every 12 (  twelve) hours as needed for cramping.  Marland Kitchen ibuprofen (ADVIL,MOTRIN) 600 MG tablet Take 600 mg by mouth as needed.  Marland Kitchen lisinopril (PRINIVIL,ZESTRIL) 5 MG tablet Take 1 tablet (5 mg total) by mouth daily.  Marland Kitchen loperamide (ANTI-DIARRHEAL) 2 MG tablet Take 2 mg by mouth as needed.  Marland Kitchen LORazepam (ATIVAN) 1 MG tablet Take 1 tablet (1 mg total) by mouth daily as needed for anxiety.  . Multiple Vitamin (MULTIVITAMIN) tablet Take 1 tablet by mouth daily.    . nitroGLYCERIN (NITROSTAT) 0.4 MG SL tablet Place 1 tablet (0.4 mg total) under the tongue every 5 (five) minutes as needed.  . Omega-3 Fatty Acids (FISH OIL) 1200 MG CAPS Take 1,200 mg by mouth daily.  . phenylephrine (SUDAFED PE) 10 MG TABS tablet Take 10 mg by mouth every 4 (four) hours as needed.  . simvastatin (ZOCOR) 40 MG tablet Take 40 mg by mouth at  bedtime.    Marland Kitchen venlafaxine (EFFEXOR) 75 MG tablet Take 75 mg by mouth 2 (two) times daily.  . vitamin E 400 UNIT capsule Take 400 Units by mouth daily.  Marland Kitchen zolpidem (AMBIEN) 10 MG tablet Take 10 mg by mouth at bedtime as needed.    . [DISCONTINUED] citalopram (CELEXA) 40 MG tablet Take 40 mg by mouth daily.    . [DISCONTINUED] LORazepam (ATIVAN) 1 MG tablet TAKE ONE TABLET EVERY DAY AS NEEDED FOR ANXIETY    Review of Systems Patient denies any headache, lightheadedness or dizziness.  No sinus or allergy symptoms.  No chest pain, tightness or palpitations. From a cardiac standpoint - stable and doing well.   No increased shortness of breath, cough or congestion.  No nausea or vomiting.  No acid reflux.  Does report some increased lower abdominal pressure that feels like menstrual cramps.  Better today.  More relaxed.  Bowels stable.  No BRBPR.  No melena.   Some anxiety.   Better.  Memory issues.  Seeing Dr Manuella Ghazi.         Objective:   Physical Exam  Filed Vitals:   09/28/13 1608  BP: 126/82  Pulse: 82  Temp: 98.3 F (36.8 C)   Pulse recheck 62  80 year old female in no acute distress.   HEENT:  Nares- clear.  Oropharynx - without lesions. NECK:  Supple.  Nontender.  No audible bruit.  HEART:  Appears to be regular. LUNGS:  No crackles or wheezing audible.  Respirations even and unlabored.  RADIAL PULSE:  Equal bilaterally.    BREASTS:  No nipple discharge or nipple retraction present.  Could not appreciate any distinct nodules or axillary adenopathy.  ABDOMEN:  Soft, nontender.  Bowel sounds present and normal.  No audible abdominal bruit.  GU:  Not performed.    EXTREMITIES:  No increased edema present.  DP pulses palpable and equal bilaterally.          Assessment & Plan:  HEALTH MAINTENANCE.  Physical today.  Last mammogram 12/25/12 - Birads II.  Just had recent colonoscopy.      I spent greater than 40 minutes with the patient and more than 50% of the time was spent in  consultation regarding the above.

## 2013-10-01 NOTE — Assessment & Plan Note (Signed)
Documented history.   

## 2013-10-01 NOTE — Assessment & Plan Note (Signed)
S/p parathyroidectomy.  Recent calcium/ionized calcium elevated.  Intact PTH elevated.  This has been discussed at length with her and her sister.  Recent calcium better.  Will hold on further w/up or referral to surgery (at this time).  Need to get some of her anxiety issues straightened out first.  She is in agreement.  As long as can keep her calcium below 11, will hold on further w/up for now.  Hold until can get anxiety under control.    

## 2013-10-23 ENCOUNTER — Other Ambulatory Visit: Payer: Self-pay | Admitting: *Deleted

## 2013-10-23 MED ORDER — VENLAFAXINE HCL 75 MG PO TABS: 75.0000 mg | ORAL_TABLET | Freq: Two times a day (BID) | ORAL | Status: DC

## 2013-10-23 MED ORDER — SIMVASTATIN 40 MG PO TABS: 40.0000 mg | ORAL_TABLET | Freq: Every day | ORAL | Status: DC

## 2013-10-23 MED ORDER — ZOLPIDEM TARTRATE 10 MG PO TABS: ORAL_TABLET | ORAL | Status: DC

## 2013-10-23 NOTE — Telephone Encounter (Signed)
rx for ambien 10mg  1/2-1 tablet q hs prn #30 with no refills.

## 2013-10-23 NOTE — Telephone Encounter (Signed)
Ok refill? 

## 2013-10-23 NOTE — Telephone Encounter (Signed)
Rx faxed to pharmacy  

## 2013-10-28 ENCOUNTER — Other Ambulatory Visit: Payer: Self-pay | Admitting: *Deleted

## 2013-10-28 NOTE — Telephone Encounter (Signed)
Addressed on 10/23/13 and refilled.

## 2013-11-30 ENCOUNTER — Other Ambulatory Visit: Payer: Self-pay | Admitting: Internal Medicine

## 2013-11-30 NOTE — Telephone Encounter (Signed)
Rx faxed to pharmacy  

## 2013-11-30 NOTE — Telephone Encounter (Signed)
Refilled lorazepam #30 with one refill.   

## 2013-11-30 NOTE — Telephone Encounter (Signed)
Ok refill? 

## 2013-11-30 NOTE — Telephone Encounter (Signed)
Forwarded to Robin.

## 2013-11-30 NOTE — Telephone Encounter (Signed)
Refilled ambien #30 with one refill.  Also, please forward message to Shirlean Mylar to schedule a f/u appt with me.  Needs appt within the next month (or two) -  Needs to be a 30 minute appt or end of 1/2 day.  Thanks.

## 2013-11-30 NOTE — Telephone Encounter (Signed)
Last refill 10/23/13, ok?

## 2014-01-21 NOTE — Telephone Encounter (Signed)
Appointment 4/29  Pt aware

## 2014-01-27 ENCOUNTER — Encounter: Payer: Self-pay | Admitting: Internal Medicine

## 2014-01-27 ENCOUNTER — Ambulatory Visit (INDEPENDENT_AMBULATORY_CARE_PROVIDER_SITE_OTHER): Payer: Medicare Other | Admitting: Internal Medicine

## 2014-01-27 ENCOUNTER — Encounter: Payer: Self-pay | Admitting: *Deleted

## 2014-01-27 VITALS — BP 110/80 | HR 92 | Temp 98.9°F | Ht 62.5 in | Wt 148.0 lb

## 2014-01-27 DIAGNOSIS — N183 Chronic kidney disease, stage 3 unspecified: Secondary | ICD-10-CM

## 2014-01-27 DIAGNOSIS — I714 Abdominal aortic aneurysm, without rupture, unspecified: Secondary | ICD-10-CM

## 2014-01-27 DIAGNOSIS — R109 Unspecified abdominal pain: Secondary | ICD-10-CM

## 2014-01-27 DIAGNOSIS — I10 Essential (primary) hypertension: Secondary | ICD-10-CM

## 2014-01-27 DIAGNOSIS — F419 Anxiety disorder, unspecified: Secondary | ICD-10-CM

## 2014-01-27 DIAGNOSIS — F411 Generalized anxiety disorder: Secondary | ICD-10-CM

## 2014-01-27 DIAGNOSIS — R102 Pelvic and perineal pain: Secondary | ICD-10-CM

## 2014-01-27 LAB — CBC WITH DIFFERENTIAL/PLATELET
BASOS PCT: 0.5 % (ref 0.0–3.0)
Basophils Absolute: 0 10*3/uL (ref 0.0–0.1)
EOS PCT: 1.3 % (ref 0.0–5.0)
Eosinophils Absolute: 0 10*3/uL (ref 0.0–0.7)
HCT: 41.3 % (ref 36.0–46.0)
HEMOGLOBIN: 13.8 g/dL (ref 12.0–15.0)
LYMPHS ABS: 1 10*3/uL (ref 0.7–4.0)
Lymphocytes Relative: 29.8 % (ref 12.0–46.0)
MCHC: 33.3 g/dL (ref 30.0–36.0)
MCV: 91.3 fl (ref 78.0–100.0)
MONO ABS: 0.4 10*3/uL (ref 0.1–1.0)
Monocytes Relative: 11.8 % (ref 3.0–12.0)
NEUTROS ABS: 1.9 10*3/uL (ref 1.4–7.7)
Neutrophils Relative %: 56.6 % (ref 43.0–77.0)
PLATELETS: 312 10*3/uL (ref 150.0–400.0)
RBC: 4.52 Mil/uL (ref 3.87–5.11)
RDW: 13.7 % (ref 11.5–14.6)
WBC: 3.4 10*3/uL — ABNORMAL LOW (ref 4.5–10.5)

## 2014-01-27 LAB — HEPATIC FUNCTION PANEL
ALBUMIN: 3.7 g/dL (ref 3.5–5.2)
ALT: 15 U/L (ref 0–35)
AST: 18 U/L (ref 0–37)
Alkaline Phosphatase: 72 U/L (ref 39–117)
Bilirubin, Direct: 0.1 mg/dL (ref 0.0–0.3)
Total Bilirubin: 0.3 mg/dL (ref 0.3–1.2)
Total Protein: 6.2 g/dL (ref 6.0–8.3)

## 2014-01-27 LAB — LIPASE: Lipase: 33 U/L (ref 11.0–59.0)

## 2014-01-27 LAB — BASIC METABOLIC PANEL
BUN: 18 mg/dL (ref 6–23)
CALCIUM: 10.5 mg/dL (ref 8.4–10.5)
CO2: 26 mEq/L (ref 19–32)
CREATININE: 1.1 mg/dL (ref 0.4–1.2)
Chloride: 101 mEq/L (ref 96–112)
GFR: 52.75 mL/min — AB (ref >60.00)
Glucose, Bld: 81 mg/dL (ref 70–99)
Potassium: 5.3 mEq/L — ABNORMAL HIGH (ref 3.5–5.1)
Sodium: 135 mEq/L (ref 135–145)

## 2014-01-27 LAB — AMYLASE: Amylase: 99 U/L (ref 27–131)

## 2014-01-27 LAB — TSH: TSH: 2.53 u[IU]/mL (ref 0.35–5.50)

## 2014-01-27 MED ORDER — ZOLPIDEM TARTRATE 10 MG PO TABS: ORAL_TABLET | ORAL | Status: DC

## 2014-01-27 MED ORDER — SIMVASTATIN 40 MG PO TABS: 40.0000 mg | ORAL_TABLET | Freq: Every day | ORAL | Status: DC

## 2014-01-27 NOTE — Progress Notes (Signed)
Pre visit review using our clinic review tool, if applicable. No additional management support is needed unless otherwise documented below in the visit note. 

## 2014-01-28 ENCOUNTER — Telehealth: Payer: Self-pay | Admitting: *Deleted

## 2014-01-28 ENCOUNTER — Other Ambulatory Visit: Payer: Self-pay | Admitting: Internal Medicine

## 2014-01-28 DIAGNOSIS — E876 Hypokalemia: Secondary | ICD-10-CM

## 2014-01-28 DIAGNOSIS — D72819 Decreased white blood cell count, unspecified: Secondary | ICD-10-CM

## 2014-01-28 NOTE — Telephone Encounter (Signed)
Son notified & I also recommended that he could even look into home health services for options.

## 2014-01-28 NOTE — Telephone Encounter (Signed)
I talked to Karen Collins about her medication at her visit this week.  We discussed the importance of taking the medication regularly.  I explained to her why she was on the medication and why she should take it regularly.  Regarding the moving to [place].  I agree this is a good idea.  I have discussed this with her as well.

## 2014-01-28 NOTE — Progress Notes (Signed)
F/u labs ordered.   

## 2014-01-28 NOTE — Telephone Encounter (Signed)
Reports that she sits on a couch all day & some days she doesnt even take her medications. He would like some help convincing her to move to [place] to be with her children. They have been trying to convince her for 7 years now. He/they are afraid of her falling or driving and having no one around to help her. Please advise.

## 2014-01-29 ENCOUNTER — Encounter: Payer: Self-pay | Admitting: Internal Medicine

## 2014-01-29 ENCOUNTER — Other Ambulatory Visit (INDEPENDENT_AMBULATORY_CARE_PROVIDER_SITE_OTHER): Payer: Medicare Other

## 2014-01-29 DIAGNOSIS — D72819 Decreased white blood cell count, unspecified: Secondary | ICD-10-CM

## 2014-01-29 DIAGNOSIS — E876 Hypokalemia: Secondary | ICD-10-CM

## 2014-01-29 LAB — POTASSIUM: Potassium: 4.5 mEq/L (ref 3.5–5.1)

## 2014-01-29 NOTE — Progress Notes (Signed)
Subjective:    Patient ID: Karen Collins, female    DOB: 1934-03-09, 80 y.o.   MRN: [id number]  GI Problem  80 year old female with past history of hypertension, hypercholesterolemia, hyperparathyroidism, CAD, chronic kidney disease, renal cell cancer and infrarenal AAA who comes in today as a work in with concerns regarding persistent and increased abdominal pain and cramping.  She is seeing Dr Manuella Ghazi for memory issues.  Had MRI 01/09/13 - revealed chronic ischemic changes with no acute ischemic changes.  Did not feel medications would be beneficial initially.  Apparently may be thinking of medication now.  She writes down things to help her remember.  Has seen Dr Gustavo Lah.  Has had colonoscopy and EGD.  Has been evaluated by [facility].  Had negative CT (latest) and negative EUS.  Still having some lower abdominal soreness and cramping.  No etiology found.  Pain and cramping have worsened recently.  Has been told has IBS.   Feels like having a period.  Feels similar to menstrual cramps.  Frequent bowel movements.  Soft stool.  Some increased anxiety.  Sister reports that she is not taking her citalopram regularly.   Has seen psychiatry.  went on three separate occasions.  She desires not to return.  No medication changes made.    Also evaluated by gyn Luna Kitchens).  No abnormality found.   She is accompanied by her sister.  History obtained from both of them.   She was hospitalized recently with hyponatremia.  This was corrected with 3% saline.  She was found to have a low urine osmolality and low urine sodium.  Had previously been eating and tea and toast type diet.  This has improved.  Diet has improved.  Has been seeing nephrology.  Sodium normal now.  Desires just to have followed here.    Past Medical History  Diagnosis Date  . Hyperlipidemia   . Hypertension   . CAD (coronary artery disease)   . MI (myocardial infarction)     Non-ST elevation MI  . GI bleed   . Renal cell carcinoma   .  Anxiety   . Chronic kidney disease (CKD), stage III (moderate)   . Depression   . GERD (gastroesophageal reflux disease)   . Chronic systolic congestive heart failure     EF of 35 %   . Arthritis   . AAA (abdominal aortic aneurysm)     s/p stent   . Kidney stones   . Diverticulitis     H/O  . Hx of migraines   . Thyroid disease   . Hx: UTI (urinary tract infection)     Outpatient Encounter Prescriptions as of 01/27/2014  Medication Sig  . acetaminophen (TYLENOL) 500 MG tablet Take 325 mg by mouth as needed.   . calcium carbonate (CALCIUM 600) 600 MG TABS Take 600 mg by mouth daily.    . cetirizine (ZYRTEC) 10 MG tablet Take 10 mg by mouth daily.  . Cholecalciferol (VITAMIN D) 400 UNITS capsule Take 400 Units by mouth daily.  . citalopram (CELEXA) 40 MG tablet Take 1 tablet (40 mg total) by mouth daily.  . diphenhydrAMINE (BENADRYL) 25 MG tablet Take 25 mg by mouth daily as needed.    . docusate sodium (COLACE) 100 MG capsule Take 100 mg by mouth daily as needed.  . Glucosamine-Chondroit-Vit C-Mn (GLUCOSAMINE 1500 COMPLEX) CAPS Take by mouth.    . hyoscyamine (LEVBID) 0.375 MG 12 hr tablet Take 0.375 mg by mouth every 12 (  twelve) hours as needed for cramping.  Marland Kitchen ibuprofen (ADVIL,MOTRIN) 600 MG tablet Take 600 mg by mouth as needed.  Marland Kitchen lisinopril (PRINIVIL,ZESTRIL) 5 MG tablet Take 1 tablet (5 mg total) by mouth daily.  Marland Kitchen loperamide (ANTI-DIARRHEAL) 2 MG tablet Take 2 mg by mouth as needed.  Marland Kitchen LORazepam (ATIVAN) 1 MG tablet TAKE ONE TABLET EVERY DAY AS NEEDED FOR ANXIETY  . Multiple Vitamin (MULTIVITAMIN) tablet Take 1 tablet by mouth daily.    . nitroGLYCERIN (NITROSTAT) 0.4 MG SL tablet Place 1 tablet (0.4 mg total) under the tongue every 5 (five) minutes as needed.  . Omega-3 Fatty Acids (FISH OIL) 1200 MG CAPS Take 1,200 mg by mouth daily.  . phenylephrine (SUDAFED PE) 10 MG TABS tablet Take 10 mg by mouth every 4 (four) hours as needed.  . simvastatin (ZOCOR) 40 MG tablet Take 1  tablet (40 mg total) by mouth at bedtime.  Marland Kitchen venlafaxine (EFFEXOR) 75 MG tablet Take 1 tablet (75 mg total) by mouth 2 (two) times daily.  . vitamin E 400 UNIT capsule Take 400 Units by mouth daily.  Marland Kitchen zolpidem (AMBIEN) 10 MG tablet 1/2 - 1 tablet q hs prn  . [DISCONTINUED] simvastatin (ZOCOR) 40 MG tablet Take 1 tablet (40 mg total) by mouth at bedtime.  . [DISCONTINUED] zolpidem (AMBIEN) 10 MG tablet TAKE 1/2 TO 1 TABLET BY MOUTH AT BEDTIMEAS NEEDED    Review of Systems Patient denies any headache, lightheadedness or dizziness.  No sinus or allergy symptoms.  No chest pain, tightness or palpitations.  From a cardiac standpoint - stable and doing well.   No increased shortness of breath, cough or congestion.  No nausea or vomiting.  No acid reflux.  Does report some increased lower abdominal pressure that feels like menstrual cramps.  Increased pain and cramping recently.   Bowels moving - 3x/day.  Soft stool.   No BRBPR.  No melena.   Some anxiety.          Objective:   Physical Exam  Filed Vitals:   01/27/14 0950  BP: 110/80  Pulse: 92  Temp: 98.9 F (4.17 C)   80 year old female in no acute distress.   HEENT:  Nares- clear.  Oropharynx - without lesions. NECK:  Supple.  Nontender.  No audible bruit.  HEART:  Appears to be regular. LUNGS:  No crackles or wheezing audible.  Respirations even and unlabored.  RADIAL PULSE:  Equal bilaterally.  ABDOMEN:  Soft.  Minimal tenderness to palpation over the lower abdomen and pelvic region.   Bowel sounds present and normal.  No audible abdominal bruit.   EXTREMITIES:  No increased edema present.  DP pulses palpable and equal bilaterally.          Assessment & Plan:  HEALTH MAINTENANCE.  Physical 09/28/13.  Last mammogram 12/25/12 - Birads II.  Schedule f/u mammogram if not already scheduled.  Just had recent colonoscopy.      I spent greater than 25 minutes with the patient and more than 50% of the time was spent in consultation regarding  the above.

## 2014-01-29 NOTE — Assessment & Plan Note (Addendum)
Persistent abdominal pain and cramping.  Has seen GI.  Evaluated at [facility].  Also evaluated by Luna Kitchens - gyn.  Reviewed notes.  CT reportedly showed abdominal bowel wall thickening on CT.  The radiologist noted that there was a deformity of the cecum by what appeared to be an external tubular structure causing compression.  There was a question of a possible mucocele.  Has been diagnosed with IBS.  Subsequently had EUS.  No acute abnormality of the cecum identified.  Last CT 06/06/12 - no acute abnormality.  Colonoscopy revealed AVM and diverticula.  EGD normal except AVM.  Given persistent pain and cramping and worsening symptoms recently, will check CT abdomen and pelvis.  Further w/up pending results.

## 2014-01-30 LAB — WBC: WBC: 3.5 10*3/uL (ref 3.4–10.8)

## 2014-01-31 ENCOUNTER — Encounter: Payer: Self-pay | Admitting: Internal Medicine

## 2014-01-31 NOTE — Assessment & Plan Note (Signed)
S/p repair.  Sees Dr Dew.  

## 2014-01-31 NOTE — Assessment & Plan Note (Signed)
Blood pressure doing well.  Off hctz now.  Follow.

## 2014-01-31 NOTE — Assessment & Plan Note (Signed)
She is having increased anxiety.  Discussed at length with her and her sister.  On citalopram.  Not taking regularly.  Discussed the need to take the citalopram on a daily basis.  Follow.  Saw psychiatry.  On lorazepam.

## 2014-01-31 NOTE — Assessment & Plan Note (Signed)
Avoid nephrotoxins.  Follow renal function and electrolytes.  Has been stable.  Follow.    

## 2014-02-01 ENCOUNTER — Encounter: Payer: Self-pay | Admitting: *Deleted

## 2014-02-09 ENCOUNTER — Ambulatory Visit: Payer: Self-pay | Admitting: Internal Medicine

## 2014-02-10 ENCOUNTER — Telehealth: Payer: Self-pay | Admitting: Internal Medicine

## 2014-02-10 ENCOUNTER — Telehealth: Payer: Self-pay | Admitting: *Deleted

## 2014-02-10 ENCOUNTER — Other Ambulatory Visit: Payer: Self-pay | Admitting: Internal Medicine

## 2014-02-10 DIAGNOSIS — E871 Hypo-osmolality and hyponatremia: Secondary | ICD-10-CM

## 2014-02-10 DIAGNOSIS — R935 Abnormal findings on diagnostic imaging of other abdominal regions, including retroperitoneum: Secondary | ICD-10-CM

## 2014-02-10 NOTE — Progress Notes (Signed)
Order placed for f/u met b.  

## 2014-02-10 NOTE — Telephone Encounter (Signed)
Notify pt that I got her CT results back and the CT reveals no definite acute abnormality.  She does have some diverticulosis.  The aneurysm appears to be stable.  There is a question of some possible thickening in the stomach wall.  Given this questionable finding, I would like to get her back in with Dr Gustavo Lah for f/u with question of need for repeat upper endoscopy.  Also, I need her to stay hydrated (since had the scan).  I would like to repeat a blood test (kidney function test and sodium) either today or tomorrow.  Non fasting lab.

## 2014-02-10 NOTE — Telephone Encounter (Signed)
Pt notified of results & lab appt scheduled for: 02/11/14 (Thu) @ 11:00 AM. Pt would like to proceed with referral to Dr. Gustavo Lah.

## 2014-02-10 NOTE — Telephone Encounter (Signed)
PA request form for the Zolpidem was faxed over to [facility]

## 2014-02-10 NOTE — Telephone Encounter (Signed)
Order placed for referral to GI.  See phone message.  Pt with abnormal CT abdomen (possible thickening of the stomach wall).  Persistent abdominal pain.  Needs appt with Dr Gustavo Lah [facility]).

## 2014-02-11 ENCOUNTER — Telehealth: Payer: Self-pay | Admitting: Internal Medicine

## 2014-02-11 ENCOUNTER — Other Ambulatory Visit: Payer: Self-pay

## 2014-02-11 DIAGNOSIS — F419 Anxiety disorder, unspecified: Secondary | ICD-10-CM

## 2014-02-11 DIAGNOSIS — G479 Sleep disorder, unspecified: Secondary | ICD-10-CM

## 2014-02-11 NOTE — Telephone Encounter (Signed)
Pt states that she is not feeling good today. She still mentioned that she feels like she is having mild menstrual cramp. Her anxiety is better. But she seemed a little confused & asked me if I could call her back later or maybe even tomorrow. I explained to her that I only needed to ask her a few questions, then she was okay to continue our conversation. After talking to her about taking the extra 1/2 of her Lorazepam, she asked me what prompted this call. I reminded her that she called [place] today about having an anxiety attack (better now). Pt seemed to verbalize understanding after repetition.

## 2014-02-11 NOTE — Telephone Encounter (Signed)
Reviewed phone messages.  She just had CT of abdomen and the abdominal cramping has been going on for a good while.  We just referred her to GI for evaluation.  Please call and see how she is feeling now.  If still with increased anxiety, but better, she can take an extra 1/2 of her lorazepam x 1.  If acute symptoms or problems, she will need to be seen.

## 2014-02-11 NOTE — Telephone Encounter (Signed)
Patient Information:  Caller Name: Gentry  Phone: [phone]  Patient: Karen Collins, Karen Collins  Gender: Female  DOB: Feb 17, 1934  Age: 80 Years  PCP: [name]  Office Follow Up:  Does the office need to follow up with this patient?: No  Instructions For The Office: N/A   Symptoms  Reason For Call & Symptoms: Pt states she is having anxiety attach and has taken her medication and is now better.  Refused triage.  Pt states she had called to cancel her lab appt at 1100 due not wanting to drive.  Reviewed Health History In EMR: N/A  Reviewed Medications In EMR: N/A  Reviewed Allergies In EMR: N/A  Reviewed Surgeries / Procedures: N/A  Date of Onset of Symptoms: 02/11/2014  Guideline(s) Used:  No Protocol Available - Information Only  Disposition Per Guideline:   Home Care  Reason For Disposition Reached:   Information only question and nurse able to answer  Advice Given:  Call Back If:  New symptoms develop  You become worse.  Patient Will Follow Care Advice:  YES

## 2014-02-11 NOTE — Telephone Encounter (Signed)
Called to follow up with patient, having loose BMs today, 2x so far today and "mesntrual like cramps". States she was seen in office for this recently (01/27/14). Denies N/V, fever, abdominal pain. Also complaining of really high anxiety today. States she always has some anxiety, but today is much worse. Has taken her Citalopram and Venlafaxine and Lorazepam already today. She kept saying, "I can't think, I can't think" States she hasn't gotten dressed today, doesn't want to shower, just wants to sit in her recliner. Original reason for call was to cancel her lab appt for today, she does not feel comfortable driving anywhere today.

## 2014-02-11 NOTE — Telephone Encounter (Signed)
Called pt and spoke to her.  She is feeling better.  Anxiety better.  No new abdominal pain or cramping.  Symptoms better than this am.  She is very comfortable with being scheduled an appt with Dr Gustavo Lah.  Does not feel she needs to be seen before.  Will try to come in tomorrow for her lab.  Will call us if problems.

## 2014-02-11 NOTE — Telephone Encounter (Signed)
Rx drug plan left vm.  Request for zolpidem was not approved [id number] for more information.  They will also send a letter via mail for our records.

## 2014-02-12 NOTE — Telephone Encounter (Signed)
Await letter for appeal.

## 2014-02-16 NOTE — Telephone Encounter (Signed)
Letter placed in your folder.

## 2014-02-18 NOTE — Telephone Encounter (Signed)
Please notify pt that her insurance is denying coverage for nightly ambien.  They will only allow 90 tablets in a year.  I tried sending in information to show need for the medication and they denied coverage.  She has two choices -  Pay for the ambien out of pocket and leave her meds as is, or I will need to have psychiatry see her at least one more time to come up with a new treatment option for her (given her other medications).  See me before calling pt.

## 2014-02-19 ENCOUNTER — Encounter: Payer: Self-pay | Admitting: *Deleted

## 2014-03-01 NOTE — Telephone Encounter (Signed)
Pt wants to know if there is a alternate substitution to the Ambien that wouldn't require her to go through insurance problems.

## 2014-03-02 NOTE — Telephone Encounter (Signed)
Left message, notifying pt and requesting if agreeable to referral.

## 2014-03-02 NOTE — Telephone Encounter (Signed)
With the other medications she is taking, the sleeping medication that I use the most will interact.  I need to have her f/u with psychiatry again to see what other treatment options they suggest.  If agreeable, let me know and I will place the order.

## 2014-03-08 NOTE — Telephone Encounter (Signed)
See message below re: referral

## 2014-03-08 NOTE — Telephone Encounter (Signed)
Order placed for psychiatry referral.  Someone should be contacting her with an appt.  Let us know if any problems.

## 2014-03-08 NOTE — Telephone Encounter (Signed)
Pt returned call.  Would like a call to f/u regarding Ambien.  States she would like to be scheduled with Dr. Rexene Edison.

## 2014-03-09 ENCOUNTER — Ambulatory Visit (INDEPENDENT_AMBULATORY_CARE_PROVIDER_SITE_OTHER): Payer: No Typology Code available for payment source | Admitting: Psychology

## 2014-03-09 DIAGNOSIS — F411 Generalized anxiety disorder: Secondary | ICD-10-CM

## 2014-03-11 DIAGNOSIS — K589 Irritable bowel syndrome without diarrhea: Secondary | ICD-10-CM | POA: Insufficient documentation

## 2014-03-11 NOTE — Telephone Encounter (Signed)
Pt called today & states that she left messages x 2 days (I do not have any messages from her on my voicemail). She states that she saw Dr. Rexene Edison & needs to know what is the next step. She has been out of her Ambien x 1 week now. She is in desperate need for some sleep. Please advise

## 2014-03-11 NOTE — Telephone Encounter (Signed)
Spoke to Dr Rexene Edison regarding the visit and spoke to Karen Collins.  She is coming in to see me tomorrow 8:00 am.

## 2014-03-12 ENCOUNTER — Encounter: Payer: Self-pay | Admitting: Internal Medicine

## 2014-03-12 ENCOUNTER — Ambulatory Visit (INDEPENDENT_AMBULATORY_CARE_PROVIDER_SITE_OTHER): Payer: Medicare Other | Admitting: Internal Medicine

## 2014-03-12 VITALS — BP 130/80 | HR 93 | Temp 98.9°F | Ht 62.5 in | Wt 145.2 lb

## 2014-03-12 DIAGNOSIS — R1084 Generalized abdominal pain: Secondary | ICD-10-CM

## 2014-03-12 DIAGNOSIS — F419 Anxiety disorder, unspecified: Secondary | ICD-10-CM

## 2014-03-12 DIAGNOSIS — F411 Generalized anxiety disorder: Secondary | ICD-10-CM

## 2014-03-12 DIAGNOSIS — G479 Sleep disorder, unspecified: Secondary | ICD-10-CM

## 2014-03-12 DIAGNOSIS — I10 Essential (primary) hypertension: Secondary | ICD-10-CM

## 2014-03-12 NOTE — Progress Notes (Signed)
Pre visit review using our clinic review tool, if applicable. No additional management support is needed unless otherwise documented below in the visit note. 

## 2014-03-12 NOTE — Patient Instructions (Signed)
Remain off the ambien.  Change the lorazepam to 1mg  in the evening (instead of taking in the am).

## 2014-03-15 ENCOUNTER — Other Ambulatory Visit: Payer: Self-pay | Admitting: Internal Medicine

## 2014-03-15 NOTE — Telephone Encounter (Signed)
Refill

## 2014-03-15 NOTE — Telephone Encounter (Signed)
Rx faxed to pharmacy  

## 2014-03-15 NOTE — Telephone Encounter (Signed)
Refilled lorazepam #30 with one refill.

## 2014-03-18 ENCOUNTER — Encounter: Payer: Self-pay | Admitting: Internal Medicine

## 2014-03-18 DIAGNOSIS — G479 Sleep disorder, unspecified: Secondary | ICD-10-CM | POA: Insufficient documentation

## 2014-03-18 NOTE — Assessment & Plan Note (Signed)
Has had extensive w/up as outlined.  CT as outlined.  Planning for upper GI soon.  Follow.

## 2014-03-18 NOTE — Assessment & Plan Note (Signed)
Continue citalopram and effexor.  Follow.

## 2014-03-18 NOTE — Progress Notes (Signed)
Subjective:    Patient ID: Karen Collins, female    DOB: Feb 27, 1934, 80 y.o.   MRN: [id number]  HPI 80 year old female with past history of hypertension, hypercholesterolemia, hyperparathyroidism, CAD, chronic kidney disease, renal cell cancer and infrarenal AAA who comes in today as a work in to discuss her medications.  She is seeing Dr Manuella Ghazi for memory issues.  Had MRI 01/09/13 - revealed chronic ischemic changes with no acute ischemic changes.  Did not feel medications would be beneficial.  Writes down things to help her remember.  Has seen Dr Gustavo Lah.  Has had colonoscopy and EGD.  Has been evaluated by [facility].  Had negative CT (latest) and negative EUS.  Was still having some lower abdominal soreness and cramping.  Had CT.  Some questionable gastric wall thickening.  Planning for UGI.  She has been off her ambien for approximately one week.  States not sleeping as well.  I discussed with her regarding trying to leave her off the [place] and for now just using the lorazepam at night (instead of in the am).  She is also on citalopram and effexor.  We discussed possible referral to a neuropsychiatrist.  Also discussed her living situation and the need for possible assisted living vs moving closer to her family.     Past Medical History  Diagnosis Date  . Hyperlipidemia   . Hypertension   . CAD (coronary artery disease)   . MI (myocardial infarction)     Non-ST elevation MI  . GI bleed   . Renal cell carcinoma   . Anxiety   . Chronic kidney disease (CKD), stage III (moderate)   . Depression   . GERD (gastroesophageal reflux disease)   . Chronic systolic congestive heart failure     EF of 35 %   . Arthritis   . AAA (abdominal aortic aneurysm)     s/p stent   . Kidney stones   . Diverticulitis     H/O  . Hx of migraines   . Thyroid disease   . Hx: UTI (urinary tract infection)     Outpatient Encounter Prescriptions as of 03/12/2014  Medication Sig  . acetaminophen (TYLENOL) 500 MG  tablet Take 325 mg by mouth as needed.   . calcium carbonate (CALCIUM 600) 600 MG TABS Take 600 mg by mouth daily.    . cetirizine (ZYRTEC) 10 MG tablet Take 10 mg by mouth daily.  . Cholecalciferol (VITAMIN D) 400 UNITS capsule Take 400 Units by mouth daily.  . citalopram (CELEXA) 40 MG tablet Take 1 tablet (40 mg total) by mouth daily.  . diphenhydrAMINE (BENADRYL) 25 MG tablet Take 25 mg by mouth daily as needed.    . docusate sodium (COLACE) 100 MG capsule Take 100 mg by mouth daily as needed.  . Glucosamine-Chondroit-Vit C-Mn (GLUCOSAMINE 1500 COMPLEX) CAPS Take by mouth.    . hyoscyamine (LEVBID) 0.375 MG 12 hr tablet Take 0.375 mg by mouth every 12 (twelve) hours as needed for cramping.  Marland Kitchen lisinopril (PRINIVIL,ZESTRIL) 5 MG tablet Take 1 tablet (5 mg total) by mouth daily.  Marland Kitchen loperamide (ANTI-DIARRHEAL) 2 MG tablet Take 2 mg by mouth as needed.  . Multiple Vitamin (MULTIVITAMIN) tablet Take 1 tablet by mouth daily.    . nitroGLYCERIN (NITROSTAT) 0.4 MG SL tablet Place 1 tablet (0.4 mg total) under the tongue every 5 (five) minutes as needed.  . Omega-3 Fatty Acids (FISH OIL) 1200 MG CAPS Take 1,200 mg by mouth daily.  Marland Kitchen  simvastatin (ZOCOR) 40 MG tablet Take 1 tablet (40 mg total) by mouth at bedtime.  Marland Kitchen venlafaxine (EFFEXOR) 75 MG tablet Take 1 tablet (75 mg total) by mouth 2 (two) times daily.  . vitamin E 400 UNIT capsule Take 400 Units by mouth daily.  . [DISCONTINUED] ibuprofen (ADVIL,MOTRIN) 600 MG tablet Take 600 mg by mouth as needed.  . [DISCONTINUED] LORazepam (ATIVAN) 1 MG tablet TAKE ONE TABLET EVERY DAY AS NEEDED FOR ANXIETY  . [DISCONTINUED] phenylephrine (SUDAFED PE) 10 MG TABS tablet Take 10 mg by mouth every 4 (four) hours as needed.  . zolpidem (AMBIEN) 10 MG tablet 1/2 - 1 tablet q hs prn    Review of Systems Patient denies any headache, lightheadedness or dizziness.  No sinus or allergy symptoms.  No chest pain, tightness or palpitations. From a cardiac standpoint -  stable and doing well.   No increased shortness of breath, cough or congestion.  No nausea or vomiting.  No acid reflux.  Does report some increased lower abdominal pressure that feels like menstrual cramps.  Bowels stable.  No BRBPR.  No melena.   Some anxiety.   Memory issues.  Seeing Dr Manuella Ghazi.  Increased stress and anxiety as outlined.  Off ambien.  Some sleeping issues.        Objective:   Physical Exam  Filed Vitals:   03/12/14 0804  BP: 130/80  Pulse: 93  Temp: 98.9 F (40.50 C)   80 year old female in no acute distress.   HEENT:  Nares- clear.  Oropharynx - without lesions. NECK:  Supple.  Nontender.  No audible bruit.  HEART:  Appears to be regular. LUNGS:  No crackles or wheezing audible.  Respirations even and unlabored.  RADIAL PULSE:  Equal bilaterally.   ABDOMEN:  Soft, nontender.  Bowel sounds present and normal.  No audible abdominal bruit.   EXTREMITIES:  No increased edema present.  DP pulses palpable and equal bilaterally.          Assessment & Plan:  DISPOSITION.  Discussed with her today.  Discussed assisted living and moving closer to her children.  She will think about this more.  Will discuss more with her in the future.    HEALTH MAINTENANCE.  Scheduled her for a physical.  Obtain records.  Last mammogram 12/25/12 - Birads II.  Just had recent colonoscopy.      I spent greater than 25 minutes with the patient and more than 50% of the time was spent in consultation regarding the above.

## 2014-03-18 NOTE — Assessment & Plan Note (Signed)
Off ambien.  Remain off.  Have her change her lorazepam to pm.  Take in the evening before bed.  Continue citalopram and effexor.  Follow.

## 2014-03-18 NOTE — Assessment & Plan Note (Signed)
Blood pressure doing well.  Follow.  

## 2014-03-19 ENCOUNTER — Encounter: Payer: Self-pay | Admitting: Internal Medicine

## 2014-03-23 ENCOUNTER — Ambulatory Visit: Payer: Self-pay | Admitting: Gastroenterology

## 2014-04-05 ENCOUNTER — Ambulatory Visit (INDEPENDENT_AMBULATORY_CARE_PROVIDER_SITE_OTHER): Payer: Medicare Other | Admitting: Internal Medicine

## 2014-04-05 ENCOUNTER — Encounter: Payer: Self-pay | Admitting: Internal Medicine

## 2014-04-05 VITALS — BP 110/80 | HR 108 | Temp 98.9°F | Ht 62.5 in | Wt 144.5 lb

## 2014-04-05 DIAGNOSIS — I2510 Atherosclerotic heart disease of native coronary artery without angina pectoris: Secondary | ICD-10-CM

## 2014-04-05 DIAGNOSIS — N183 Chronic kidney disease, stage 3 unspecified: Secondary | ICD-10-CM

## 2014-04-05 DIAGNOSIS — K922 Gastrointestinal hemorrhage, unspecified: Secondary | ICD-10-CM

## 2014-04-05 DIAGNOSIS — Z8719 Personal history of other diseases of the digestive system: Secondary | ICD-10-CM

## 2014-04-05 DIAGNOSIS — F3289 Other specified depressive episodes: Secondary | ICD-10-CM

## 2014-04-05 DIAGNOSIS — E785 Hyperlipidemia, unspecified: Secondary | ICD-10-CM

## 2014-04-05 DIAGNOSIS — R413 Other amnesia: Secondary | ICD-10-CM

## 2014-04-05 DIAGNOSIS — F32A Depression, unspecified: Secondary | ICD-10-CM

## 2014-04-05 DIAGNOSIS — I868 Varicose veins of other specified sites: Secondary | ICD-10-CM

## 2014-04-05 DIAGNOSIS — E213 Hyperparathyroidism, unspecified: Secondary | ICD-10-CM

## 2014-04-05 DIAGNOSIS — F419 Anxiety disorder, unspecified: Secondary | ICD-10-CM

## 2014-04-05 DIAGNOSIS — F329 Major depressive disorder, single episode, unspecified: Secondary | ICD-10-CM

## 2014-04-05 DIAGNOSIS — I10 Essential (primary) hypertension: Secondary | ICD-10-CM

## 2014-04-05 DIAGNOSIS — G479 Sleep disorder, unspecified: Secondary | ICD-10-CM

## 2014-04-05 DIAGNOSIS — I714 Abdominal aortic aneurysm, without rupture, unspecified: Secondary | ICD-10-CM

## 2014-04-05 DIAGNOSIS — F411 Generalized anxiety disorder: Secondary | ICD-10-CM

## 2014-04-05 DIAGNOSIS — R1084 Generalized abdominal pain: Secondary | ICD-10-CM

## 2014-04-05 DIAGNOSIS — D509 Iron deficiency anemia, unspecified: Secondary | ICD-10-CM

## 2014-04-05 DIAGNOSIS — C649 Malignant neoplasm of unspecified kidney, except renal pelvis: Secondary | ICD-10-CM

## 2014-04-05 MED ORDER — LORAZEPAM 1 MG PO TABS: 1.0000 mg | ORAL_TABLET | Freq: Every day | ORAL | Status: DC | PRN

## 2014-04-05 NOTE — Progress Notes (Signed)
Pre visit review using our clinic review tool, if applicable. No additional management support is needed unless otherwise documented below in the visit note. 

## 2014-04-05 NOTE — Progress Notes (Signed)
Subjective:    Patient ID: Karen Collins, female    DOB: [birth year], 80 y.o.   MRN: [id number]  HPI 80 year old female with past history of hypertension, hypercholesterolemia, hyperparathyroidism, CAD, chronic kidney disease, renal cell cancer and infrarenal AAA who comes in today for a scheduled follow up.   She has seen Dr Manuella Ghazi for memory issues.  Had MRI 01/09/13 - revealed chronic ischemic changes with no acute ischemic changes.  Did not feel medications would be beneficial.  Writes down things to help her remember.  Has seen Dr Gustavo Lah.  Has had colonoscopy and EGD.  Has been evaluated by [facility].  Had negative EUS.  Was still having some lower abdominal soreness and cramping.  Had CT.  Some questionable gastric wall thickening.  Recently had UGI.  Spoke with [employer] and discussed UGI -  ok.   She has been off her [place].  Was not sleeping as well. See last note for details.   I discussed with her regarding trying to leave off the [place] and ust using the lorazepam at night (instead of in the am).  She states this did not work for her.  Back to taking her lorazepam in the morning.  She reports not sleeping and then reports dreaming a lot.  Unclear how much she is sleeping.   She is also on citalopram and effexor.  We again discussed possible referral to a neuropsychiatrist or psychiatrist.   Again discussed her living situation and the need for possible assisted living vs moving closer to her family.     Past Medical History  Diagnosis Date  . Hyperlipidemia   . Hypertension   . CAD (coronary artery disease)   . MI (myocardial infarction)     Non-ST elevation MI  . GI bleed   . Renal cell carcinoma   . Anxiety   . Chronic kidney disease (CKD), stage III (moderate)   . Depression   . GERD (gastroesophageal reflux disease)   . Chronic systolic congestive heart failure     EF of 35 %   . Arthritis   . AAA (abdominal aortic aneurysm)     s/p stent   . Kidney stones   .  Diverticulitis     H/O  . Hx of migraines   . Thyroid disease   . Hx: UTI (urinary tract infection)     Outpatient Encounter Prescriptions as of 04/05/2014  Medication Sig  . acetaminophen (TYLENOL) 500 MG tablet Take 325 mg by mouth as needed.   . calcium carbonate (CALCIUM 600) 600 MG TABS Take 600 mg by mouth daily.    . cetirizine (ZYRTEC) 10 MG tablet Take 10 mg by mouth daily.  . Cholecalciferol (VITAMIN D) 400 UNITS capsule Take 400 Units by mouth daily.  . citalopram (CELEXA) 40 MG tablet Take 1 tablet (40 mg total) by mouth daily.  . diphenhydrAMINE (BENADRYL) 25 MG tablet Take 25 mg by mouth daily as needed.    . docusate sodium (COLACE) 100 MG capsule Take 100 mg by mouth daily as needed.  . Glucosamine-Chondroit-Vit C-Mn (GLUCOSAMINE 1500 COMPLEX) CAPS Take by mouth.    . hyoscyamine (LEVBID) 0.375 MG 12 hr tablet Take 0.375 mg by mouth every 12 (twelve) hours as needed for cramping.  Marland Kitchen lisinopril (PRINIVIL,ZESTRIL) 5 MG tablet Take 1 tablet (5 mg total) by mouth daily.  Marland Kitchen loperamide (ANTI-DIARRHEAL) 2 MG tablet Take 2 mg by mouth as needed.  Marland Kitchen LORazepam (ATIVAN) 1 MG tablet TAKE  ONE TABLET EVERY DAY AS NEEDED FOR ANXIETY  . Multiple Vitamin (MULTIVITAMIN) tablet Take 1 tablet by mouth daily.    . nitroGLYCERIN (NITROSTAT) 0.4 MG SL tablet Place 1 tablet (0.4 mg total) under the tongue every 5 (five) minutes as needed.  . Omega-3 Fatty Acids (FISH OIL) 1200 MG CAPS Take 1,200 mg by mouth daily.  . simvastatin (ZOCOR) 40 MG tablet Take 1 tablet (40 mg total) by mouth at bedtime.  Marland Kitchen venlafaxine (EFFEXOR) 75 MG tablet Take 1 tablet (75 mg total) by mouth 2 (two) times daily.  . vitamin E 400 UNIT capsule Take 400 Units by mouth daily.  Marland Kitchen zolpidem (AMBIEN) 10 MG tablet 1/2 - 1 tablet q hs prn    Review of Systems Patient denies any headache, lightheadedness or dizziness.  No sinus or allergy symptoms.  No chest pain, tightness or palpitations. From a cardiac standpoint - stable  and doing well.   No increased shortness of breath, cough or congestion.  No nausea or vomiting.  No acid reflux.  Recent CT and UGI as outlined.  Still with lower abdominal discomfort.  Concern over hernia.  See GIs note for details.  Plans for Christianne London to discuss with Dr Tamala Julian - possible repair.   Bowels stable.  No BRBPR.  No melena.   Some anxiety.   Memory issues.  Seeing Dr Manuella Ghazi.  Increased stress and anxiety as outlined.  Off ambien.  Some sleeping issues.   See above.       Objective:   Physical Exam  Filed Vitals:   04/05/14 1001  BP: 110/80  Pulse: 108  Temp: 98.9 F (37.2 C)   Blood pressure recheck:  20/79  80 year old female in no acute distress.   HEENT:  Nares- clear.  Oropharynx - without lesions. NECK:  Supple.  Nontender.  No audible bruit.  HEART:  Appears to be regular. LUNGS:  No crackles or wheezing audible.  Respirations even and unlabored.  RADIAL PULSE:  Equal bilaterally.   ABDOMEN:  Soft.  No significant tenderness.  Appears to have lower abdominal wall hernia. More pronounced with standing.   Minimal tenderness to palpation.    Bowel sounds present and normal.  No audible abdominal bruit.   EXTREMITIES:  No increased edema present.  DP pulses palpable and equal bilaterally.          Assessment & Plan:  DISPOSITION.  Discussed with her again today.  Discussed assisted living and moving closer to her children.  She will think about this more.  Will discuss more with her in the future.    HEALTH MAINTENANCE.  Scheduled her for a physical.   Last mammogram 12/25/12 - Birads II.  Needs f/u mammogram.  Just had recent colonoscopy.      I spent greater than 25 minutes with the patient and more than 50% of the time was spent in consultation regarding the above.

## 2014-04-11 ENCOUNTER — Encounter: Payer: Self-pay | Admitting: Internal Medicine

## 2014-04-11 NOTE — Assessment & Plan Note (Signed)
S/p parathyroidectomy.  Recent calcium/ionized calcium elevated.  Intact PTH elevated.  This has been discussed at length with her and her sister.  Recent calcium better.  Will hold on further w/up or referral to surgery (at this time).  Need to get some of her anxiety issues straightened out first.  She is in agreement.  As long as can keep her calcium below 11, will hold on further w/up for now.  Hold until can get anxiety under control.

## 2014-04-11 NOTE — Assessment & Plan Note (Signed)
Has had extensive w/up as outlined.  CT and UGI as outlined.  Discussed with GI - regarding plan.  Planning to discuss with Dr Tamala Julian regarding evaluation of hernia.  Pt in agreement.

## 2014-04-11 NOTE — Assessment & Plan Note (Signed)
Stable.  Sees Dr Rockey Situ.  Continue risk factor modification.  Is s/p MI and stenting.

## 2014-04-11 NOTE — Assessment & Plan Note (Signed)
Blood pressure doing well.  Follow.  

## 2014-04-11 NOTE — Assessment & Plan Note (Signed)
On citalopram and effexor.  With increased anxiety.  Sleeping is an issue.  Off ambien.  Would still like to keep her off if possible.  Will hold on trazodone given interaction with citalopram.  Given on effexor, citalopram and lorazepam, needs referral to psychiatry for help with medication management and treatment for her ongoing anxiety and sleep.

## 2014-04-11 NOTE — Assessment & Plan Note (Signed)
On atorvastatin.  Low cholesterol diet.  Follow lipid panel and liver function.

## 2014-04-11 NOTE — Assessment & Plan Note (Signed)
Documented history.   

## 2014-04-11 NOTE — Assessment & Plan Note (Signed)
Worked up by Dr Gustavo Lah.  Colonoscopy 2012 - diverticula and AVM.  EGD 11/2010 - normal except for AVM.

## 2014-04-11 NOTE — Assessment & Plan Note (Signed)
Followed by Dr Dew.   

## 2014-04-11 NOTE — Assessment & Plan Note (Signed)
Continue citalopram and effexor.  Takes lorazepam.  Still some increased anxiety and sleeping issues.  Discussed at length with her today.  Will refer to psychiatry for evaluation and medication management.  Pt in agreement.

## 2014-04-11 NOTE — Assessment & Plan Note (Signed)
Seeing Dr Manuella Ghazi.  Just had MRI.  Results as outlined.  Initially felt medications not warranted.  Follow. Continue to follow up with Dr Manuella Ghazi.  Apparently may be contemplating medications now.  Follow.

## 2014-04-11 NOTE — Assessment & Plan Note (Signed)
Off ambien.  Remain off.  Back to taking her lorazepam in the am.  Feels works better for her.  Continue citalopram and effexor.  Refer to psychiatry as outlined.

## 2014-04-11 NOTE — Assessment & Plan Note (Signed)
S/p repair.  Sees Dr Dew.  

## 2014-04-11 NOTE — Assessment & Plan Note (Signed)
Avoid nephrotoxins.  Follow renal function and electrolytes.  Has been stable.  Follow.

## 2014-04-11 NOTE — Assessment & Plan Note (Signed)
Follow cbc.  Has had GI w/up.   

## 2014-04-30 ENCOUNTER — Telehealth: Payer: Self-pay | Admitting: *Deleted

## 2014-04-30 NOTE — Telephone Encounter (Signed)
Returned called, unable to contact pt, left message for pt to return call.

## 2014-04-30 NOTE — Telephone Encounter (Signed)
Pt called left message requesting a safe prescription or nonprescription sleep aide.  Pt states she was told to discontinue Ambien by MD.  Please advise

## 2014-04-30 NOTE — Telephone Encounter (Signed)
I have discussed with regarding her current medications, holding on further medications at this time.  I have referred her to psych for further evaluation and medication adjustment.  Can see if can get earlier appt with psych.  I am going to need their input for medication management (given her current regimen).  Thanks.

## 2014-05-14 ENCOUNTER — Telehealth: Payer: Self-pay | Admitting: *Deleted

## 2014-05-14 NOTE — Telephone Encounter (Signed)
Pt calls to report that she needs something to help her sleep wince you d/c her Ambien. She would like something sent to [facility]. She needs something badly. She is having nightmares.

## 2014-05-14 NOTE — Telephone Encounter (Signed)
Spoke with pt.  She is going to take her lorazepam and citalopram in the am and effexor in the evening.  Has appt with Dr Nicolasa Ducking coming up.  Hopefully can hold off on adding back [place].  Pt expresses understanding.

## 2014-07-06 ENCOUNTER — Ambulatory Visit (INDEPENDENT_AMBULATORY_CARE_PROVIDER_SITE_OTHER): Payer: Medicare Other | Admitting: Internal Medicine

## 2014-07-06 ENCOUNTER — Other Ambulatory Visit (HOSPITAL_COMMUNITY): Payer: Self-pay | Admitting: *Deleted

## 2014-07-06 ENCOUNTER — Encounter: Payer: Self-pay | Admitting: Internal Medicine

## 2014-07-06 VITALS — BP 120/80 | HR 97 | Temp 98.1°F | Ht 62.0 in | Wt 149.5 lb

## 2014-07-06 DIAGNOSIS — I2510 Atherosclerotic heart disease of native coronary artery without angina pectoris: Secondary | ICD-10-CM

## 2014-07-06 DIAGNOSIS — Z1239 Encounter for other screening for malignant neoplasm of breast: Secondary | ICD-10-CM

## 2014-07-06 DIAGNOSIS — I868 Varicose veins of other specified sites: Secondary | ICD-10-CM

## 2014-07-06 DIAGNOSIS — C649 Malignant neoplasm of unspecified kidney, except renal pelvis: Secondary | ICD-10-CM

## 2014-07-06 DIAGNOSIS — R413 Other amnesia: Secondary | ICD-10-CM

## 2014-07-06 DIAGNOSIS — R0989 Other specified symptoms and signs involving the circulatory and respiratory systems: Secondary | ICD-10-CM

## 2014-07-06 DIAGNOSIS — F419 Anxiety disorder, unspecified: Secondary | ICD-10-CM

## 2014-07-06 DIAGNOSIS — I714 Abdominal aortic aneurysm, without rupture, unspecified: Secondary | ICD-10-CM

## 2014-07-06 DIAGNOSIS — E7800 Pure hypercholesterolemia, unspecified: Secondary | ICD-10-CM

## 2014-07-06 DIAGNOSIS — R350 Frequency of micturition: Secondary | ICD-10-CM

## 2014-07-06 DIAGNOSIS — D72819 Decreased white blood cell count, unspecified: Secondary | ICD-10-CM

## 2014-07-06 DIAGNOSIS — G479 Sleep disorder, unspecified: Secondary | ICD-10-CM

## 2014-07-06 DIAGNOSIS — F32A Depression, unspecified: Secondary | ICD-10-CM

## 2014-07-06 DIAGNOSIS — F329 Major depressive disorder, single episode, unspecified: Secondary | ICD-10-CM

## 2014-07-06 DIAGNOSIS — I10 Essential (primary) hypertension: Secondary | ICD-10-CM

## 2014-07-06 DIAGNOSIS — R1084 Generalized abdominal pain: Secondary | ICD-10-CM

## 2014-07-06 DIAGNOSIS — E213 Hyperparathyroidism, unspecified: Secondary | ICD-10-CM

## 2014-07-06 LAB — URINALYSIS, ROUTINE W REFLEX MICROSCOPIC
Bilirubin Urine: NEGATIVE
Hgb urine dipstick: NEGATIVE
Ketones, ur: NEGATIVE
Leukocytes, UA: NEGATIVE
Nitrite: NEGATIVE
PH: 6 (ref 5.0–8.0)
SPECIFIC GRAVITY, URINE: 1.02 (ref 1.000–1.030)
Total Protein, Urine: NEGATIVE
URINE GLUCOSE: NEGATIVE
Urobilinogen, UA: 0.2 (ref 0.0–1.0)
WBC UA: NONE SEEN (ref 0–?)

## 2014-07-06 NOTE — Assessment & Plan Note (Signed)
Off ambien.  Remain off.  Back to taking her lorazepam in the am.  Feels works better for her.  Continue citalopram and effexor.  Refer to psychiatry as outlined.

## 2014-07-06 NOTE — Progress Notes (Signed)
Subjective:    Patient ID: Karen Collins, female    DOB: 1934-07-05, 80 y.o.   MRN: [id number]  HPI 80 year old female with past history of hypertension, hypercholesterolemia, hyperparathyroidism, CAD, chronic kidney disease, renal cell cancer and infrarenal AAA who comes in today for a scheduled follow up.   She has seen Dr Manuella Ghazi for memory issues.  Had MRI 01/09/13 - revealed chronic ischemic changes with no acute ischemic changes.  Did not feel medications would be beneficial.  Writes down things to help her remember.  Has seen Dr Gustavo Lah.  Has had colonoscopy and EGD.  Has been evaluated by [facility].  Had negative EUS.  Was still having some lower abdominal soreness and cramping.  Had CT.  Some questionable gastric wall thickening.  Recently had UGI.  Spoke with [employer] and discussed UGI -  ok.   She recently saw Dr Gustavo Lah in follow up.  He went over all of her scans and tests to date.  Informed her that some of her symptoms could be related to scar tissue.  No further testing felt warranted.  She has been off her [place].  Was not sleeping as well. See last note for details.   I discussed with her regarding trying to leave off the [place] and using the lorazepam at night (instead of in the am).  She states this did not work for her.  Back to taking her lorazepam in the morning.  She reports not sleeping and then reports dreaming a lot.  Unclear how much she is sleeping.   She is also on citalopram and effexor.  We again discussed possible referral to a neuropsychiatrist or psychiatrist.   Again discussed her living situation and the need for possible assisted living vs moving closer to her family.  She was late to her psych appt.  They rescheduled, but she cancelled her next appt.  Desires not to go here in town.  Request [facility] referral.     Past Medical History  Diagnosis Date  . Hyperlipidemia   . Hypertension   . CAD (coronary artery disease)   . MI (myocardial infarction)     Non-ST  elevation MI  . GI bleed   . Renal cell carcinoma   . Anxiety   . Chronic kidney disease (CKD), stage III (moderate)   . Depression   . GERD (gastroesophageal reflux disease)   . Chronic systolic congestive heart failure     EF of 35 %   . Arthritis   . AAA (abdominal aortic aneurysm)     s/p stent   . Kidney stones   . Diverticulitis     H/O  . Hx of migraines   . Thyroid disease   . Hx: UTI (urinary tract infection)     Outpatient Encounter Prescriptions as of 07/06/2014  Medication Sig  . acetaminophen (TYLENOL) 500 MG tablet Take 325 mg by mouth as needed.   . calcium carbonate (CALCIUM 600) 600 MG TABS Take 600 mg by mouth daily.    . cetirizine (ZYRTEC) 10 MG tablet Take 10 mg by mouth daily.  . Cholecalciferol (VITAMIN D) 400 UNITS capsule Take 400 Units by mouth daily.  . citalopram (CELEXA) 40 MG tablet Take 1 tablet (40 mg total) by mouth daily.  . diphenhydrAMINE (BENADRYL) 25 MG tablet Take 25 mg by mouth daily as needed.    . docusate sodium (COLACE) 100 MG capsule Take 100 mg by mouth daily as needed.  . Glucosamine-Chondroit-Vit C-Mn (  GLUCOSAMINE 1500 COMPLEX) CAPS Take by mouth.    . hyoscyamine (LEVBID) 0.375 MG 12 hr tablet Take 0.375 mg by mouth every 12 (twelve) hours as needed for cramping.  Marland Kitchen lisinopril (PRINIVIL,ZESTRIL) 5 MG tablet Take 1 tablet (5 mg total) by mouth daily.  Marland Kitchen loperamide (ANTI-DIARRHEAL) 2 MG tablet Take 2 mg by mouth as needed.  Marland Kitchen LORazepam (ATIVAN) 1 MG tablet Take 1 tablet (1 mg total) by mouth daily as needed for anxiety.  . Multiple Vitamin (MULTIVITAMIN) tablet Take 1 tablet by mouth daily.    . nitroGLYCERIN (NITROSTAT) 0.4 MG SL tablet Place 1 tablet (0.4 mg total) under the tongue every 5 (five) minutes as needed.  . Omega-3 Fatty Acids (FISH OIL) 1200 MG CAPS Take 1,200 mg by mouth daily.  . simvastatin (ZOCOR) 40 MG tablet Take 1 tablet (40 mg total) by mouth at bedtime.  Marland Kitchen venlafaxine (EFFEXOR) 75 MG tablet Take 1 tablet (75 mg  total) by mouth 2 (two) times daily.  . vitamin E 400 UNIT capsule Take 400 Units by mouth daily.  Marland Kitchen zolpidem (AMBIEN) 10 MG tablet 1/2 - 1 tablet q hs prn    Review of Systems Patient denies any headache, lightheadedness or dizziness.  No sinus or allergy symptoms.  No chest pain, tightness or palpitations. From a cardiac standpoint - stable and doing well.   No increased shortness of breath, cough or congestion.  No nausea or vomiting.  No acid reflux.  Recent CT and UGI as outlined.  Still with lower abdominal discomfort.  There was concern over hernia.  See GIs note for details.  Christianne London to discuss with Dr Tamala Julian - possible repair.   Bowels stable.  No BRBPR.  No melena.   Met with Dr Gustavo Lah recently.  He discussed her scans,etc.  Felt possible scar tissue contributing.  Some anxiety.   Memory issues.  Seeing Dr Manuella Ghazi.  Increased stress and anxiety as outlined.  Off ambien.  Some sleeping issues.   See above.  Discussed at length with her today regarding referral to a neuropsychiatrist.  Feel this is contributing to her memory.  Does report some increased urinary frequency.       Objective:   Physical Exam  Filed Vitals:   07/06/14 1338  BP: 120/80  Pulse: 97  Temp: 98.1 F (64.81 C)   80 year old female in no acute distress.   HEENT:  Nares- clear.  Oropharynx - without lesions. NECK:  Supple.  Nontender.  Left carotid bruit.   HEART:  Appears to be regular. LUNGS:  No crackles or wheezing audible.  Respirations even and unlabored.  RADIAL PULSE:  Equal bilaterally.    BREASTS:  No nipple discharge or nipple retraction present.  Could not appreciate any distinct nodules or axillary adenopathy.  ABDOMEN:  Soft.   No significant tenderness to palpation.  Minimal left lower quadrant tenderness.  Bowel sounds present and normal.  No audible abdominal bruit.  GU:  Not performed.    EXTREMITIES:  No increased edema present.  DP pulses palpable and equal bilaterally.           Assessment & Plan:  DISPOSITION.  Discussed with her again today.  Discussed assisted living and moving closer to her children.  She declines at this time.  Will discuss more with her in the future.    HEALTH MAINTENANCE.  Physical today.   Last mammogram 12/25/12 - Birads II.  Needs f/u mammogram.  Schedule.  Just had  recent colonoscopy.      I spent greater than 40 minutes with the patient and more than 50% of the time was spent in consultation regarding the above.

## 2014-07-06 NOTE — Progress Notes (Signed)
Pre visit review using our clinic review tool, if applicable. No additional management support is needed unless otherwise documented below in the visit note. 

## 2014-07-07 LAB — CULTURE, URINE COMPREHENSIVE
Colony Count: NO GROWTH
ORGANISM ID, BACTERIA: NO GROWTH

## 2014-07-11 ENCOUNTER — Encounter: Payer: Self-pay | Admitting: Internal Medicine

## 2014-07-11 DIAGNOSIS — R350 Frequency of micturition: Secondary | ICD-10-CM | POA: Insufficient documentation

## 2014-07-11 DIAGNOSIS — D72819 Decreased white blood cell count, unspecified: Secondary | ICD-10-CM | POA: Insufficient documentation

## 2014-07-11 DIAGNOSIS — R0989 Other specified symptoms and signs involving the circulatory and respiratory systems: Secondary | ICD-10-CM | POA: Insufficient documentation

## 2014-07-11 NOTE — Assessment & Plan Note (Signed)
Has had extensive w/up as outlined.  CT and UGI as outlined.  Just recently met with Dr Gustavo Lah.  He went over her w/up.  Felt to be possibly related to scar tissue.  Follow.

## 2014-07-11 NOTE — Assessment & Plan Note (Signed)
On citalopram and effexor.  With increased anxiety.  Sleeping is an issue.  Off ambien.  Would still like to keep her off if possible.  Will hold on trazodone given interaction with citalopram.  Given on effexor, citalopram and lorazepam, needs referral to psychiatry for help with medication management and treatment for her ongoing anxiety and sleep.

## 2014-07-11 NOTE — Assessment & Plan Note (Signed)
Stable.  Sees Dr Rockey Situ.  Continue risk factor modification.  Is s/p MI and stenting.

## 2014-07-11 NOTE — Assessment & Plan Note (Addendum)
Seeing Dr Manuella Ghazi.  Had MRI.  Results as outlined.  Initially felt medications not warranted.  Follow. Continue to follow up with Dr Manuella Ghazi.

## 2014-07-11 NOTE — Assessment & Plan Note (Signed)
Followed by Dr Dew.   

## 2014-07-11 NOTE — Assessment & Plan Note (Signed)
Documented history.   

## 2014-07-11 NOTE — Assessment & Plan Note (Addendum)
Continue citalopram and effexor.  Takes lorazepam.  Still some increased anxiety and sleeping issues.  Discussed at length with her today.  Will refer to psychiatry for evaluation and medication management.  Pt in agreement.  Will see if we can arrange appt with neuropsych.  Feel this is contributing to her memory issues.

## 2014-07-11 NOTE — Assessment & Plan Note (Signed)
Recheck cbc with next labs.   

## 2014-07-11 NOTE — Assessment & Plan Note (Signed)
Check urinalysis and urine culture to confirm no infection.

## 2014-07-11 NOTE — Assessment & Plan Note (Signed)
Blood pressure doing well.  Follow.  

## 2014-07-11 NOTE — Assessment & Plan Note (Signed)
S/p repair.  Sees Dr Dew.  

## 2014-07-11 NOTE — Assessment & Plan Note (Signed)
On atorvastatin.  Low cholesterol diet.  Follow lipid panel and liver function.

## 2014-07-11 NOTE — Assessment & Plan Note (Signed)
S/p parathyroidectomy.  Recent calcium/ionized calcium elevated.  Intact PTH elevated.  This has been discussed at length with her and her sister.  Recent calcium better.  Will hold on further w/up or referral to surgery (at this time).  Need to get some of her anxiety issues straightened out first.  She is in agreement.  As long as can keep her calcium below 11, will hold on further w/up for now.  Hold until can get anxiety under control.

## 2014-07-11 NOTE — Assessment & Plan Note (Signed)
With the left carotid bruit, check carotid ultrasound.

## 2014-07-14 ENCOUNTER — Telehealth: Payer: Self-pay

## 2014-07-14 NOTE — Telephone Encounter (Signed)
The patient called and is hoping to a medication called in to help her sleep.  She stated this was discussed at her last visit, however, she stated she did have an rx called in.  Pharmacy - [facility] in [place]   Pt callback [phone]

## 2014-07-15 ENCOUNTER — Other Ambulatory Visit (INDEPENDENT_AMBULATORY_CARE_PROVIDER_SITE_OTHER): Payer: Medicare Other

## 2014-07-15 DIAGNOSIS — E213 Hyperparathyroidism, unspecified: Secondary | ICD-10-CM

## 2014-07-15 DIAGNOSIS — E7800 Pure hypercholesterolemia, unspecified: Secondary | ICD-10-CM

## 2014-07-15 DIAGNOSIS — I10 Essential (primary) hypertension: Secondary | ICD-10-CM

## 2014-07-15 DIAGNOSIS — D72819 Decreased white blood cell count, unspecified: Secondary | ICD-10-CM

## 2014-07-15 LAB — HEPATIC FUNCTION PANEL
ALBUMIN: 3.4 g/dL — AB (ref 3.5–5.2)
ALK PHOS: 76 U/L (ref 39–117)
ALT: 15 U/L (ref 0–35)
AST: 17 U/L (ref 0–37)
Bilirubin, Direct: 0.1 mg/dL (ref 0.0–0.3)
TOTAL PROTEIN: 6.7 g/dL (ref 6.0–8.3)
Total Bilirubin: 0.7 mg/dL (ref 0.2–1.2)

## 2014-07-15 LAB — CBC WITH DIFFERENTIAL/PLATELET
BASOS PCT: 0.6 % (ref 0.0–3.0)
Basophils Absolute: 0 10*3/uL (ref 0.0–0.1)
EOS PCT: 1.1 % (ref 0.0–5.0)
Eosinophils Absolute: 0 10*3/uL (ref 0.0–0.7)
HCT: 44.7 % (ref 36.0–46.0)
HEMOGLOBIN: 14.5 g/dL (ref 12.0–15.0)
LYMPHS PCT: 37.5 % (ref 12.0–46.0)
Lymphs Abs: 1.5 10*3/uL (ref 0.7–4.0)
MCHC: 32.4 g/dL (ref 30.0–36.0)
MCV: 92.7 fl (ref 78.0–100.0)
Monocytes Absolute: 0.3 10*3/uL (ref 0.1–1.0)
Monocytes Relative: 8.1 % (ref 3.0–12.0)
NEUTROS ABS: 2.1 10*3/uL (ref 1.4–7.7)
Neutrophils Relative %: 52.7 % (ref 43.0–77.0)
Platelets: 217 10*3/uL (ref 150.0–400.0)
RBC: 4.82 Mil/uL (ref 3.87–5.11)
RDW: 13.9 % (ref 11.5–15.5)
WBC: 3.9 10*3/uL — AB (ref 4.0–10.5)

## 2014-07-15 LAB — LIPID PANEL
Cholesterol: 206 mg/dL — ABNORMAL HIGH (ref 0–200)
HDL: 56 mg/dL (ref >39.00)
LDL Cholesterol: 131 mg/dL — ABNORMAL HIGH (ref 0–99)
NONHDL: 150
TRIGLYCERIDES: 95 mg/dL (ref 0.0–149.0)
Total CHOL/HDL Ratio: 4
VLDL: 19 mg/dL (ref 0.0–40.0)

## 2014-07-15 LAB — BASIC METABOLIC PANEL
BUN: 11 mg/dL (ref 6–23)
CHLORIDE: 105 meq/L (ref 96–112)
CO2: 25 mEq/L (ref 19–32)
CREATININE: 1 mg/dL (ref 0.4–1.2)
Calcium: 9.8 mg/dL (ref 8.4–10.5)
GFR: 56.32 mL/min — ABNORMAL LOW (ref >60.00)
Glucose, Bld: 93 mg/dL (ref 70–99)
Potassium: 4.5 mEq/L (ref 3.5–5.1)
SODIUM: 138 meq/L (ref 135–145)

## 2014-07-15 NOTE — Telephone Encounter (Signed)
I have discussed this with her and her sister multiple times. She is on multiple medications and I have concern about Karen Collins causing more harm than good.  She was late for her psych appt and cancelled the other one.  I am working on getting her an appt at [facility] for evaluation for this.  Would hold on more medication at this time.  She has citalopram, effexor and lorazepam.

## 2014-07-15 NOTE — Telephone Encounter (Signed)
Please advise 

## 2014-07-16 NOTE — Telephone Encounter (Signed)
LMTCB (see result note also)

## 2014-07-16 NOTE — Telephone Encounter (Signed)
Pt.notified

## 2014-07-22 ENCOUNTER — Telehealth: Payer: Self-pay | Admitting: *Deleted

## 2014-07-22 NOTE — Telephone Encounter (Signed)
Called pt on a couple of occasions.  Unable to reach. Left message on her machine that I called and was still wanting her to go to psych.  I explained that we had discussed this in detail and that I wanted to continue to work on this appt.

## 2014-07-22 NOTE — Telephone Encounter (Signed)
Pt called again today asking for a refill on her sleeping medication. I reminded patient the we just discussed this over the phone recently. I went over our previous phone conversation & pt states that she does not want a referral. She asked what can she try over the counter instead, & I told her that I could not recommend a sleep aid over the counter. Pt stated that she understood & will ask her pharmacist what she can try.

## 2014-07-27 ENCOUNTER — Ambulatory Visit: Payer: Self-pay | Admitting: Gastroenterology

## 2014-07-28 ENCOUNTER — Other Ambulatory Visit: Payer: Self-pay | Admitting: *Deleted

## 2014-07-28 ENCOUNTER — Encounter: Payer: Self-pay | Admitting: Gastroenterology

## 2014-07-28 MED ORDER — LORAZEPAM 1 MG PO TABS: 1.0000 mg | ORAL_TABLET | Freq: Every day | ORAL | Status: DC | PRN

## 2014-07-28 NOTE — Telephone Encounter (Signed)
Pt called requesting a refill on Lorazepam. And again she wants something to help her with sleep to replace the Ambien & she states that she is experiencing "anxiety" or the though of going to [facility] to see a psychiatrist & would rather just try an alternative medication that wouldn't involve her seeing them. Please advise on both.

## 2014-07-28 NOTE — Telephone Encounter (Signed)
Refilled her lorazepam #30 with no refills.  I will see if she will change her mind about appt here.

## 2014-07-28 NOTE — Telephone Encounter (Signed)
Rx faxed to [facility].

## 2014-07-28 NOTE — Telephone Encounter (Signed)
Pt left another message this morning stating the exact same things. Re: alternative sleep medication that she can take to avoid going to [facility]. She also stated again that the thought of going is causing anxiety.

## 2014-08-03 ENCOUNTER — Ambulatory Visit: Payer: Self-pay | Admitting: Internal Medicine

## 2014-08-03 LAB — HM MAMMOGRAPHY: HM MAMMO: NEGATIVE

## 2014-08-04 ENCOUNTER — Encounter: Payer: Self-pay | Admitting: *Deleted

## 2014-09-02 ENCOUNTER — Encounter: Payer: Self-pay | Admitting: Internal Medicine

## 2014-09-10 ENCOUNTER — Ambulatory Visit: Payer: Self-pay | Admitting: Internal Medicine

## 2014-11-12 ENCOUNTER — Encounter: Payer: Self-pay | Admitting: Internal Medicine

## 2014-11-12 ENCOUNTER — Ambulatory Visit (INDEPENDENT_AMBULATORY_CARE_PROVIDER_SITE_OTHER): Payer: Medicare PPO | Admitting: Internal Medicine

## 2014-11-12 VITALS — BP 141/92 | HR 87 | Temp 97.8°F | Ht 62.0 in | Wt 147.1 lb

## 2014-11-12 DIAGNOSIS — D72819 Decreased white blood cell count, unspecified: Secondary | ICD-10-CM

## 2014-11-12 DIAGNOSIS — N183 Chronic kidney disease, stage 3 unspecified: Secondary | ICD-10-CM

## 2014-11-12 DIAGNOSIS — G479 Sleep disorder, unspecified: Secondary | ICD-10-CM

## 2014-11-12 DIAGNOSIS — I6529 Occlusion and stenosis of unspecified carotid artery: Secondary | ICD-10-CM

## 2014-11-12 DIAGNOSIS — I714 Abdominal aortic aneurysm, without rupture, unspecified: Secondary | ICD-10-CM

## 2014-11-12 DIAGNOSIS — I2510 Atherosclerotic heart disease of native coronary artery without angina pectoris: Secondary | ICD-10-CM

## 2014-11-12 DIAGNOSIS — F329 Major depressive disorder, single episode, unspecified: Secondary | ICD-10-CM

## 2014-11-12 DIAGNOSIS — Z0000 Encounter for general adult medical examination without abnormal findings: Secondary | ICD-10-CM

## 2014-11-12 DIAGNOSIS — C649 Malignant neoplasm of unspecified kidney, except renal pelvis: Secondary | ICD-10-CM

## 2014-11-12 DIAGNOSIS — D509 Iron deficiency anemia, unspecified: Secondary | ICD-10-CM

## 2014-11-12 DIAGNOSIS — E213 Hyperparathyroidism, unspecified: Secondary | ICD-10-CM

## 2014-11-12 DIAGNOSIS — R413 Other amnesia: Secondary | ICD-10-CM

## 2014-11-12 DIAGNOSIS — F32A Depression, unspecified: Secondary | ICD-10-CM

## 2014-11-12 DIAGNOSIS — I10 Essential (primary) hypertension: Secondary | ICD-10-CM

## 2014-11-12 DIAGNOSIS — F419 Anxiety disorder, unspecified: Secondary | ICD-10-CM

## 2014-11-12 NOTE — Progress Notes (Signed)
Pre visit review using our clinic review tool, if applicable. No additional management support is needed unless otherwise documented below in the visit note. 

## 2014-11-15 ENCOUNTER — Encounter: Payer: Self-pay | Admitting: Internal Medicine

## 2014-11-15 DIAGNOSIS — Z0000 Encounter for general adult medical examination without abnormal findings: Secondary | ICD-10-CM | POA: Insufficient documentation

## 2014-11-15 NOTE — Assessment & Plan Note (Signed)
Last Cr was 1.1.  Recheck met b with next labs.

## 2014-11-15 NOTE — Assessment & Plan Note (Signed)
Recheck cbc.  

## 2014-11-15 NOTE — Assessment & Plan Note (Signed)
Documented history of renal cell cancer.  Follow renal function.

## 2014-11-15 NOTE — Assessment & Plan Note (Signed)
Blood pressure has been under good control.  Recheck today improved.  Same medication regimen.  Follow pressures.

## 2014-11-15 NOTE — Assessment & Plan Note (Signed)
On citalopram and effexor.  Takes lorazepam once daily.  Getting out more.  Walking.  Going to silver sneakers.  Feels better.  Still having issues with sleep.

## 2014-11-15 NOTE — Assessment & Plan Note (Signed)
Stable.  Has seen Dr Rockey Situ  Continue risk factor modification.  Is s/p MI and stenting.

## 2014-11-15 NOTE — Assessment & Plan Note (Signed)
Recheck cbc with next labs.   

## 2014-11-15 NOTE — Assessment & Plan Note (Signed)
On citalopram and effexor and takes lorazepam daily.  Doing better.  Going out more.  Follow.

## 2014-11-15 NOTE — Assessment & Plan Note (Signed)
With the history of carotid stenosis and with the audible bruit, refer back to Dr Lucky Cowboy for carotid ultrasound.

## 2014-11-15 NOTE — Assessment & Plan Note (Signed)
Has seen Dr Manuella Ghazi.  Had MRI.  Continue to f/u with Dr Manuella Ghazi.

## 2014-11-15 NOTE — Progress Notes (Signed)
Patient ID: Karen Collins, female   DOB: [birth year], 81 y.o.   MRN: [id number]   Subjective:    Patient ID: Karen Collins, female    DOB: 23-Oct-1933, 81 y.o.   MRN: [id number]  HPI  Patient here for a scheduled follow.  She is accompanied by her sister Jerrye Beavers).  History obtained from both of them.  Still having issues with her lower abdomen - right side.  Has had extensive w/up.  See Dr Marton Redwood note and Dr Thompson Caul note for details.  She feels is stable.  Wants to monitor.  Eating and drinking well.  Bowels stable.  Her sister reports that she is doing better.  She is getting out more.  Going to exercise class and walking with a neighbor.  Feels better.  She is still not sleeping well.  Feels she needs something to help her sleep.  She is already on citalopram and effexor.  Also takes lorazepam daily.  We again discussed the need for psych referral.  She is in agreement.  No cardiac symptoms with increased activity and exertion.     Past Medical History  Diagnosis Date  . Hyperlipidemia   . Hypertension   . CAD (coronary artery disease)   . MI (myocardial infarction)     Non-ST elevation MI  . GI bleed   . Renal cell carcinoma   . Anxiety   . Chronic kidney disease (CKD), stage III (moderate)   . Depression   . GERD (gastroesophageal reflux disease)   . Chronic systolic congestive heart failure     EF of 35 %   . Arthritis   . AAA (abdominal aortic aneurysm)     s/p stent   . Kidney stones   . Diverticulitis     H/O  . Hx of migraines   . Thyroid disease   . Hx: UTI (urinary tract infection)     Current Outpatient Prescriptions on File Prior to Visit  Medication Sig Dispense Refill  . acetaminophen (TYLENOL) 500 MG tablet Take 325 mg by mouth as needed.     . calcium carbonate (CALCIUM 600) 600 MG TABS Take 600 mg by mouth daily.      . cetirizine (ZYRTEC) 10 MG tablet Take 10 mg by mouth daily.    . Cholecalciferol (VITAMIN D) 400 UNITS capsule Take 400 Units by  mouth daily.    . citalopram (CELEXA) 40 MG tablet Take 1 tablet (40 mg total) by mouth daily. 30 tablet 3  . diphenhydrAMINE (BENADRYL) 25 MG tablet Take 25 mg by mouth daily as needed.      . docusate sodium (COLACE) 100 MG capsule Take 100 mg by mouth daily as needed.    . Glucosamine-Chondroit-Vit C-Mn (GLUCOSAMINE 1500 COMPLEX) CAPS Take by mouth.      . hyoscyamine (LEVBID) 0.375 MG 12 hr tablet Take 0.375 mg by mouth every 12 (twelve) hours as needed for cramping.    Marland Kitchen lisinopril (PRINIVIL,ZESTRIL) 5 MG tablet Take 1 tablet (5 mg total) by mouth daily. 90 tablet 3  . loperamide (ANTI-DIARRHEAL) 2 MG tablet Take 2 mg by mouth as needed.    Marland Kitchen LORazepam (ATIVAN) 1 MG tablet Take 1 tablet (1 mg total) by mouth daily as needed for anxiety. 30 tablet 0  . Multiple Vitamin (MULTIVITAMIN) tablet Take 1 tablet by mouth daily.      . nitroGLYCERIN (NITROSTAT) 0.4 MG SL tablet Place 1 tablet (0.4 mg total) under the tongue every 5 (five) minutes as  needed. 30 tablet 1  . Omega-3 Fatty Acids (FISH OIL) 1200 MG CAPS Take 1,200 mg by mouth daily.    . simvastatin (ZOCOR) 40 MG tablet Take 1 tablet (40 mg total) by mouth at bedtime. 90 tablet 1  . venlafaxine (EFFEXOR) 75 MG tablet Take 1 tablet (75 mg total) by mouth 2 (two) times daily. 180 tablet 0  . vitamin E 400 UNIT capsule Take 400 Units by mouth daily.     No current facility-administered medications on file prior to visit.    Review of Systems  Constitutional: Negative for appetite change and unexpected weight change.  HENT: Positive for sinus pressure. Negative for congestion.   Respiratory: Negative for cough, chest tightness and shortness of breath.   Cardiovascular: Negative for chest pain, palpitations and leg swelling.  Gastrointestinal: Positive for abdominal pain (left lower abdominal pain/side pain.  stable.  ). Negative for nausea, vomiting and diarrhea.  Genitourinary: Negative for dysuria, vaginal discharge and difficulty  urinating.  Musculoskeletal: Negative for back pain and joint swelling.  Skin: Negative for color change and rash.  Neurological: Negative for dizziness, light-headedness and headaches.  Psychiatric/Behavioral: Positive for sleep disturbance.       Getting out more.  Her anxiety appears to be better.         Objective:     Blood pressure recheck:  132/88  Physical Exam  HENT:  Nose: Nose normal.  Mouth/Throat: Oropharynx is clear and moist.  Neck: Neck supple. No thyromegaly present.  Cardiovascular: Normal rate and regular rhythm.   Pulmonary/Chest: Breath sounds normal. No respiratory distress. She has no wheezes.  Abdominal: Soft. Bowel sounds are normal. There is no tenderness.  Musculoskeletal: She exhibits no edema or tenderness.  Lymphadenopathy:    She has no cervical adenopathy.  Skin: No rash noted. No erythema.    BP 141/92 mmHg  Pulse 87  Temp(Src) 97.8 F (36.6 C) (Oral)  Ht _0  (1.575 m)  Wt 147 lb 2 oz (66.735 kg)  BMI 26.90 kg/m2  SpO2 98% Wt Readings from Last 3 Encounters:  11/12/14 147 lb 2 oz (66.735 kg)  07/06/14 149 lb 8 oz (67.813 kg)  04/05/14 144 lb 8 oz (65.545 kg)     Lab Results  Component Value Date   WBC 3.9* 07/15/2014   HGB 14.5 07/15/2014   HCT 44.7 07/15/2014   PLT 217.0 07/15/2014   GLUCOSE 93 07/15/2014   CHOL 206* 07/15/2014   TRIG 95.0 07/15/2014   HDL 56.00 07/15/2014   LDLCALC 131* 07/15/2014   ALT 15 07/15/2014   AST 17 07/15/2014   NA 138 07/15/2014   K 4.5 07/15/2014   CL 105 07/15/2014   CREATININE 1.0 07/15/2014   BUN 11 07/15/2014   CO2 25 07/15/2014   TSH 2.53 01/27/2014       Assessment & Plan:   Problem List Items Addressed This Visit    AAA (abdominal aortic aneurysm)    S/p repair.  Sees Dr Lucky Cowboy.        Anemia, iron deficiency    Recheck cbc.        Relevant Orders   CBC with Differential/Platelet   Anxiety    On citalopram and effexor and takes lorazepam daily.  Doing better.  Going out  more.  Follow.        Relevant Orders   TSH   Ambulatory referral to Psychiatry   Carotid stenosis    With the history of carotid stenosis and with  the audible bruit, refer back to Dr Lucky Cowboy for carotid ultrasound.        Relevant Orders   Ambulatory referral to Vascular Surgery   CKD (chronic kidney disease), stage III    Last Cr was 1.1.  Recheck met b with next labs.        Coronary atherosclerosis    Stable.  Has seen Dr Rockey Situ  Continue risk factor modification.  Is s/p MI and stenting.        Relevant Orders   Lipid panel   Hepatic function panel   Depression    On citalopram and effexor.  Takes lorazepam once daily.  Getting out more.  Walking.  Going to silver sneakers.  Feels better.  Still having issues with sleep.        Relevant Orders   Ambulatory referral to Psychiatry   Essential hypertension, benign    Blood pressure has been under good control.  Recheck today improved.  Same medication regimen.  Follow pressures.        Health care maintenance    Mammogram 08/03/14 - Birads I.  Physical 07/06/14.  Colonoscopy 12/25/10.        Hyperparathyroidism    She is s/p parathyroidectomy.  Intact PTH and ionized calcium elevated.  We discussed this with her and her sister.  Declines any further evaluation.  Recheck calcium with next labs.  See previous notes for details.        Leukopenia    Recheck cbc with next labs.        Relevant Orders   CBC with Differential/Platelet   Memory change    Has seen Dr Manuella Ghazi.  Had MRI.  Continue to f/u with Dr Manuella Ghazi.        Relevant Orders   TSH   Renal cell cancer    Documented history of renal cell cancer.  Follow renal function.        Relevant Orders   Basic metabolic panel   Sleeping difficulty - Primary    Having issues with sleeping.  Already on effexor, citalopram and takes lorazepam daily.  Given on multiple medications and still having issues with sleeping, I do feel she warrants evaluation by psychiatry for help  with medication adjustments.  Pt agrees and states will go this time.        Relevant Orders   TSH   Ambulatory referral to Psychiatry     I spent 25 minutes with the patient and more than 50% of the time was spent in consultation regarding the above.     [name], MD

## 2014-11-15 NOTE — Assessment & Plan Note (Signed)
S/p repair.  Sees Dr Lucky Cowboy.

## 2014-11-15 NOTE — Assessment & Plan Note (Signed)
She is s/p parathyroidectomy.  Intact PTH and ionized calcium elevated.  We discussed this with her and her sister.  Declines any further evaluation.  Recheck calcium with next labs.  See previous notes for details.

## 2014-11-15 NOTE — Assessment & Plan Note (Signed)
Mammogram 08/03/14 - Birads I.  Physical 07/06/14.  Colonoscopy 12/25/10.

## 2014-11-15 NOTE — Assessment & Plan Note (Signed)
Having issues with sleeping.  Already on effexor, citalopram and takes lorazepam daily.  Given on multiple medications and still having issues with sleeping, I do feel she warrants evaluation by psychiatry for help with medication adjustments.  Pt agrees and states will go this time.

## 2014-11-26 ENCOUNTER — Other Ambulatory Visit (INDEPENDENT_AMBULATORY_CARE_PROVIDER_SITE_OTHER): Payer: Commercial Managed Care - HMO

## 2014-11-26 DIAGNOSIS — F419 Anxiety disorder, unspecified: Secondary | ICD-10-CM

## 2014-11-26 DIAGNOSIS — D509 Iron deficiency anemia, unspecified: Secondary | ICD-10-CM | POA: Diagnosis not present

## 2014-11-26 DIAGNOSIS — R413 Other amnesia: Secondary | ICD-10-CM | POA: Diagnosis not present

## 2014-11-26 DIAGNOSIS — D72819 Decreased white blood cell count, unspecified: Secondary | ICD-10-CM

## 2014-11-26 DIAGNOSIS — G479 Sleep disorder, unspecified: Secondary | ICD-10-CM

## 2014-11-26 DIAGNOSIS — C649 Malignant neoplasm of unspecified kidney, except renal pelvis: Secondary | ICD-10-CM

## 2014-11-26 DIAGNOSIS — I2510 Atherosclerotic heart disease of native coronary artery without angina pectoris: Secondary | ICD-10-CM | POA: Diagnosis not present

## 2014-11-26 LAB — BASIC METABOLIC PANEL
BUN: 14 mg/dL (ref 6–23)
CHLORIDE: 104 meq/L (ref 96–112)
CO2: 25 mEq/L (ref 19–32)
Calcium: 10.2 mg/dL (ref 8.4–10.5)
Creatinine, Ser: 1.23 mg/dL — ABNORMAL HIGH (ref 0.40–1.20)
GFR: 44.82 mL/min — ABNORMAL LOW (ref >60.00)
Glucose, Bld: 102 mg/dL — ABNORMAL HIGH (ref 70–99)
Potassium: 4 mEq/L (ref 3.5–5.1)
Sodium: 137 mEq/L (ref 135–145)

## 2014-11-26 LAB — CBC WITH DIFFERENTIAL/PLATELET
BASOS PCT: 0.5 % (ref 0.0–3.0)
Basophils Absolute: 0 10*3/uL (ref 0.0–0.1)
EOS PCT: 1.2 % (ref 0.0–5.0)
Eosinophils Absolute: 0 10*3/uL (ref 0.0–0.7)
HCT: 45.8 % (ref 36.0–46.0)
HEMOGLOBIN: 15.6 g/dL — AB (ref 12.0–15.0)
LYMPHS ABS: 1.3 10*3/uL (ref 0.7–4.0)
Lymphocytes Relative: 40.3 % (ref 12.0–46.0)
MCHC: 34 g/dL (ref 30.0–36.0)
MCV: 90.4 fl (ref 78.0–100.0)
MONOS PCT: 10.9 % (ref 3.0–12.0)
Monocytes Absolute: 0.4 10*3/uL (ref 0.1–1.0)
Neutro Abs: 1.5 10*3/uL (ref 1.4–7.7)
Neutrophils Relative %: 47.1 % (ref 43.0–77.0)
PLATELETS: 223 10*3/uL (ref 150.0–400.0)
RBC: 5.07 Mil/uL (ref 3.87–5.11)
RDW: 13.7 % (ref 11.5–15.5)
WBC: 3.2 10*3/uL — AB (ref 4.0–10.5)

## 2014-11-26 LAB — TSH: TSH: 2.65 u[IU]/mL (ref 0.35–4.50)

## 2014-11-26 LAB — HEPATIC FUNCTION PANEL
ALK PHOS: 82 U/L (ref 39–117)
ALT: 13 U/L (ref 0–35)
AST: 13 U/L (ref 0–37)
Albumin: 4.1 g/dL (ref 3.5–5.2)
BILIRUBIN DIRECT: 0.2 mg/dL (ref 0.0–0.3)
TOTAL PROTEIN: 6.6 g/dL (ref 6.0–8.3)
Total Bilirubin: 0.9 mg/dL (ref 0.2–1.2)

## 2014-11-26 LAB — LIPID PANEL
Cholesterol: 276 mg/dL — ABNORMAL HIGH (ref 0–200)
HDL: 57.4 mg/dL (ref >39.00)
LDL CALC: 190 mg/dL — AB (ref 0–99)
NONHDL: 218.6
TRIGLYCERIDES: 145 mg/dL (ref 0.0–149.0)
Total CHOL/HDL Ratio: 5
VLDL: 29 mg/dL (ref 0.0–40.0)

## 2014-11-29 ENCOUNTER — Encounter: Payer: Self-pay | Admitting: *Deleted

## 2014-11-29 ENCOUNTER — Other Ambulatory Visit: Payer: Self-pay | Admitting: Internal Medicine

## 2014-11-29 DIAGNOSIS — N289 Disorder of kidney and ureter, unspecified: Secondary | ICD-10-CM

## 2014-11-29 NOTE — Progress Notes (Signed)
Order placed for f/u met b.  

## 2014-12-02 ENCOUNTER — Other Ambulatory Visit: Payer: Self-pay | Admitting: Internal Medicine

## 2014-12-02 NOTE — Telephone Encounter (Signed)
Last refill 10.28.15, last OV 2.12.16.  Please advise refill

## 2014-12-02 NOTE — Telephone Encounter (Signed)
Last OV 2.12.16.  Please advise refill

## 2014-12-02 NOTE — Telephone Encounter (Signed)
rx faxed

## 2014-12-02 NOTE — Telephone Encounter (Signed)
Refilled citalopram #30 with 2 refills.  

## 2014-12-02 NOTE — Telephone Encounter (Signed)
ok'd refill lorazepam #30 with no refills.

## 2014-12-13 ENCOUNTER — Other Ambulatory Visit (INDEPENDENT_AMBULATORY_CARE_PROVIDER_SITE_OTHER): Payer: Commercial Managed Care - HMO

## 2014-12-13 DIAGNOSIS — E871 Hypo-osmolality and hyponatremia: Secondary | ICD-10-CM | POA: Diagnosis not present

## 2014-12-13 LAB — BASIC METABOLIC PANEL
BUN: 12 mg/dL (ref 6–23)
CALCIUM: 10.2 mg/dL (ref 8.4–10.5)
CO2: 27 mEq/L (ref 19–32)
Chloride: 103 mEq/L (ref 96–112)
Creatinine, Ser: 1.11 mg/dL (ref 0.40–1.20)
GFR: 50.45 mL/min — AB (ref >60.00)
Glucose, Bld: 139 mg/dL — ABNORMAL HIGH (ref 70–99)
Potassium: 4.1 mEq/L (ref 3.5–5.1)
Sodium: 134 mEq/L — ABNORMAL LOW (ref 135–145)

## 2014-12-14 ENCOUNTER — Encounter: Payer: Self-pay | Admitting: *Deleted

## 2014-12-31 ENCOUNTER — Other Ambulatory Visit: Payer: Self-pay | Admitting: *Deleted

## 2014-12-31 MED ORDER — LISINOPRIL 5 MG PO TABS: 5.0000 mg | ORAL_TABLET | Freq: Every day | ORAL | Status: DC

## 2015-01-21 NOTE — Consult Note (Signed)
PATIENT NAME:  Karen Collins, ALEN MR#:  [id number] DATE OF BIRTH:  1934-04-02  NEPHROLOGY CONSULTATION  DATE OF CONSULTATION:  07/15/2013  REFERRING PHYSICIAN: Dustin Flock, MD. CONSULTING PHYSICIAN:  Noah Lembke Lilian Kapur, MD  REASON FOR CONSULTATION: Acute hyponatremia.   HISTORY OF PRESENT ILLNESS: The patient is a very pleasant 81 year old Caucasian female with past medical history of hypertension, hyperlipidemia, depression, anxiety, history of panic attacks, coronary artery disease status post MI with history of bare metal stent to the left circumflex, history of recurrent GI bleed secondary AV malformations,  GERD, history of renal cell carcinoma status post right nephrectomy, infrarenal abdominal aortic aneurysm status post surgical repair, chronic kidney disease, stage III, who presented to [facility] with hyponatremia.   The patient sees Dr. [name] for primary care. The patient had some routine blood work performed and it appears that she was found to have a low sodium at Dr. Bary Leriche office. Upon arrival here, the patient's serum sodium was found to be low at 120. Her baseline serum sodium appears to be 134 from July of 2014. She was started on IV fluid hydration with 0.9 normal saline and sodium initially came up to 124. However, sodium remains a bit low at present, and sodium was found to be 124 again today.   The patient had a blood osmolality of 253. Urine osmolality was found to be low at 157 and urine sodium was also found to be a bit low at 10. Her oral mucosa appeared to be dry on evaluation.   The patient denies nausea, vomiting, diarrhea. She does have known history of irritable bowel syndrome and has bowel movements but these are not loose. The patient has been drinking liquid but it does not appear to be an abnormally excessive amount per her report. She does drink wine approximately 3 to 4 times a week but is not drinking significant amounts of beer  daily at home.   PAST MEDICAL HISTORY:  1.  Hypertension.  2.  Hyperlipidemia.  3.  Depression.  4.  Anxiety.  5.  History of panic attacks.  6.  Coronary artery disease status post MI with history of bare metal stent to the left circumflex in December 2012.  7.  History of recurrent GI bleed secondary to AV malformations.  8.  Chronic kidney disease, stage III.  9.  GERD.  10.  History of renal cell carcinoma status post right nephrectomy.  11.  Infrarenal abdominal aortic aneurysm status post surgical repair.   CURRENT INPATIENT MEDICATIONS:  Include:  1.  Normal saline 0.9 at 50 mL/hour. 2.  Acetaminophen 650 mg p.o. q.4 hours p.r.n.  3.  Atorvastatin 80 mg p.o. at bedtime.  4.  Zyrtec 10 mg p.o. daily.  5.  Cholecalciferol 400 mg p.o. daily.  6.  Celexa 40 mg p.o. q.a.m.  7.  Bentyl 20 mg p.o. q.6 a.m.  8.  Lisinopril 5 mg p.o. daily.  9.  Ativan 1 mg p.o. q.4 hours p.r.n.  10.  Multivitamin 1 tablet p.o. daily.  11.  Zofran 4 mg IV q.4 hours p.r.n. nausea.  12.  Effexor 75 mg p.o. b.i.d.  13.  Vitamin E 400 units p.o. daily.  14.  Ambien 10 mg p.o. at bedtime.  15.  Robitussin 5 mL p.o. q.4 hours p.r.n.   SOCIAL HISTORY: The patient resides in [place]. She is divorced. She has 3 children. She used to work for a [employer]. She has history of remote tobacco  abuse and quit smoking 10 years ago. She drinks wine approximately 3 nights a week. She denies illicit drug use.   FAMILY HISTORY: The patient's father had history of coronary artery disease and is deceased. The patient's mother is also deceased at age 75 and apparently had history of hypertension.   REVIEW OF SYSTEMS:  CONSTITUTIONAL: Denies fevers, chills, weight loss.  EYES: Denies diplopia, blurry vision.  HEENT: Denies headaches or hearing loss. Denies epistaxis.  CARDIOVASCULAR: Denies chest pain, palpitations, PND.  RESPIRATORY: Denies cough, shortness of breath, or hemoptysis.  GASTROINTESTINAL:  Reports abdominal cramping in her lower abdomen but denies nausea, vomiting, diarrhea, but has history of irritable bowel.  GENITOURINARY: Denies frequency, urgency, or dysuria.  MUSCULOSKELETAL: Denies joint pain, swelling, or redness.  INTEGUMENTARY: Denies skin rashes or lesions.  NEUROLOGIC: Denies focal extremity numbness, weakness, or tingling.  PSYCHIATRIC: Has history of depression, anxiety, and panic attacks.  ENDOCRINE: Denies polyuria, polydipsia.  HEMOLYMPHATIC: Denies easy bruisability, bleeding, or swollen lymph nodes.  ALLERGY AND IMMUNOLOGIC: Denies seasonal allergies or history of immunodeficiency.   PHYSICAL EXAMINATION:  VITAL SIGNS: Temperature 97.8, pulse 82, respirations 20, blood pressure 131/78, pulse ox 95%.  GENERAL: A well-developed, well-nourished Caucasian female who appears her stated age, currently in no acute distress.  HEENT: Normocephalic, atraumatic. Extraocular movements are intact. Pupils equal, round, and reactive to light. No scleral icterus. Conjunctivae are pink. No epistaxis noted. Gross hearing intact. Oral mucosae are dry.  NECK: Supple without JVD or lymphadenopathy.  LUNGS: Clear to auscultation bilaterally with normal respiratory effort.  HEART: S1, S2, regular rate and rhythm. No murmurs, rubs, or gallops appreciated.  ABDOMEN: Soft. Bowel sounds are present. There is mild bilateral lower quadrant tenderness noted. No rebound or guarding. No gross organomegaly appreciated.  EXTREMITIES: No clubbing, cyanosis, or edema noted.  NEUROLOGIC: Alert and oriented to time, person, and place. Strength is five out of five in both upper and lower extremities.  SKIN: Warm and dry. No rashes noted.  GENITOURINARY: No suprapubic tenderness is noted at this time.  MUSCULOSKELETAL: No joint redness, swelling or tenderness appreciated.  PSYCHIATRIC: With appropriate affect. She appears to be slightly anxious but has good insight.   LABORATORY DATA: Sodium 124,  potassium 4.5, chloride 95, CO2 22, BUN 13, creatinine 1.03, glucose 82, blood osmolality 253, total protein 6.5, albumin 4, total bilirubin 0.4, alkaline phosphatase 116, AST 28, ALT 24. Troponin 0.04. TSH 3.02. CBC shows WBC 4.3, hemoglobin 14, hematocrit 40, and platelets 236. Urine sodium initially was noted to be quite low at 10 urine. Osmolality was 193 upon presentation but today is noted as being 157. UA  shows a specific gravity of 1.006. Serum cortisol is normal at 11.3.   IMPRESSION: This is a 81 year old Caucasian female with past medical history of hypertension, hyperlipidemia, depression, anxiety, panic attacks, coronary disease status post MI with history of bare metal stent to the left circumflex artery in December 2012, history of recurrent GI bleed secondary to AV malformations, chronic kidney disease, stage III, GERD, history of renal cell carcinoma status post right nephrectomy, infrarenal abdominal aortic aneurysm status post surgical repair, who presented to [facility] with hyponatremia.   PROBLEM LIST:  1.  Acute hyponatremia.  2.  Chronic kidney disease, stage III.  3.  Hypertension.  4.  Hyperlipidemia.     PLAN: The patient presented with a very interesting case. She was noted to be hyponatremic upon initial laboratory evaluation. In review of the patient's home medications,  it appears that she was on hydrochlorothiazide triamterene. This was discontinued.   In regards to her labs, she did show a low blood osmolality of 253. Urine osmolality was actually found to be low initially at 193 and when repeated was 157. Urine sodium was also low at 10. The patient was initially started on hydration with 0.9 normal saline; however, it appears that over the past 2 days, serum sodium has remained low at 124.   The low urine osmolality with a low serum sodium suggests that there could be some element of primary polydipsia. Confounding all this, however, is the fact  that the patient was on diuretic therapy. However, we would have expected the urine sodium to be a bit higher in that situation. We will discontinue 0.9 normal saline at this point in time. We will start the patient on 3% saline at 40 mL/hour. We will draw serum sodiums q.4 hours going forward for the next 24 to 48 hours. We will continue the free water restriction as well for now. We will also check a serum uric acid level.   In regards to her underlying chronic kidney disease, stage III, the patient may end up requiring outpatient monitoring of this, as her EGFR currently is noted as being 52. She has a history of nephrectomy. We are in agreement with the continuation of lisinopril for management of her underlying chronic kidney disease.   I would like to thank Dr. Posey Pronto for this kind referral.   Further plan as the patient progresses.    ____________________________ Tama High, MD [id number] D: 07/15/2013 17:07:37 ET T: 07/15/2013 17:38:36 ET JOB#: [id number]  cc: Tama High, MD, <Dictator>

## 2015-01-21 NOTE — Consult Note (Signed)
PATIENT NAME:  Karen Collins, FARINAS MR#:  [id number] DATE OF BIRTH:  27-Jan-1934  NEPHROLOGY CONSULTATION  DATE OF CONSULTATION:  07/15/2013  REFERRING PHYSICIAN: Dustin Flock, MD. CONSULTING PHYSICIAN:  Leondro Coryell Lilian Kapur, MD  REASON FOR CONSULTATION: Acute hyponatremia.   HISTORY OF PRESENT ILLNESS: The patient is a very pleasant 81 year old Caucasian female with past medical history of hypertension, hyperlipidemia, depression, anxiety, history of panic attacks, coronary artery disease status post MI with history of bare metal stent to the left circumflex, history of recurrent GI bleed secondary AV malformations,  GERD, history of renal cell carcinoma status post right nephrectomy, infrarenal abdominal aortic aneurysm status post surgical repair, chronic kidney disease, stage III, who presented to [facility] with hyponatremia.   The patient sees Dr. [name] for primary care. The patient had some routine blood work performed and it appears that she was found to have a low sodium at Dr. Bary Leriche office. Upon arrival here, the patient's serum sodium was found to be low at 120. Her baseline serum sodium appears to be 134 from July of 2014. She was started on IV fluid hydration with 0.9 normal saline and sodium initially came up to 124. However, sodium remains a bit low at present, and sodium was found to be 124 again today.   The patient had a blood osmolality of 253. Urine osmolality was found to be low at 157 and urine sodium was also found to be a bit low at 10. Her oral mucosa appeared to be dry on evaluation.   The patient denies nausea, vomiting, diarrhea. She does have known history of irritable bowel syndrome and has bowel movements but these are not loose. The patient has been drinking liquid but it does not appear to be an abnormally excessive amount per her report. She does drink wine approximately 3 to 4 times a week but is not drinking significant amounts of beer  daily at home.   PAST MEDICAL HISTORY:  1.  Hypertension.  2.  Hyperlipidemia.  3.  Depression.  4.  Anxiety.  5.  History of panic attacks.  6.  Coronary artery disease status post MI with history of bare metal stent to the left circumflex in December 2012.  7.  History of recurrent GI bleed secondary to AV malformations.  8.  Chronic kidney disease, stage III.  9.  GERD 10.  History of renal cell carcinoma status post right nephrectomy.  11.  Infrarenal abdominal aortic aneurysm status post surgical repair.   CURRENT INPATIENT MEDICATIONS:  Include:  1.  Normal saline 0.9 at 50 mL/hour. 2.  Acetaminophen 650 mg p.o. q.4 hours p.r.n.  3.  Atorvastatin 80 mg p.o. at bedtime.  4.  Zyrtec 10 mg p.o. daily.  5.  Cholecalciferol 400 mg p.o. daily.  6.  Celexa 40 mg p.o. q.a.m.  7.  Bentyl 20 mg p.o. q.6 a.m.  8.  Lisinopril 5 mg p.o. daily.  9.  Ativan 1 mg p.o. q.4 hours p.r.n.  10.  Multivitamin 1 tablet p.o. daily.  11.  Zofran 4 mg IV q.4 hours p.r.n. nausea.  12.  Effexor 75 mg p.o. b.i.d.  13.  Vitamin E 400 units p.o. daily.  14.  Ambien 10 mg p.o. at bedtime.  15.  Robitussin 5 mL p.o. q.4 hours p.r.n.   SOCIAL HISTORY: The patient resides in [place]. She is divorced. She has 3 children. She used to work for a [employer]. She has history of remote tobacco abuse  and quit smoking 10 years ago. She drinks wine approximately 3 nights a week. She denies illicit drug use.   FAMILY HISTORY: The patient's father had history of coronary artery disease and is deceased. The patient's mother is also deceased at age 65 and apparently had history of hypertension.   REVIEW OF SYSTEMS:  CONSTITUTIONAL: Denies fevers, chills, weight loss.  EYES: Denies diplopia, blurry vision.  HEENT: Denies headaches or hearing loss. Denies epistaxis.  CARDIOVASCULAR: Denies chest pain, palpitations, PND.  RESPIRATORY: Denies cough, shortness of breath, or hemoptysis.  GASTROINTESTINAL: Reports  abdominal cramping in her lower abdomen but denies nausea, vomiting, diarrhea, but has history of irritable bowel.  GENITOURINARY: Denies frequency, urgency, or dysuria.  MUSCULOSKELETAL: Denies joint pain, swelling, or redness.  INTEGUMENTARY: Denies skin rashes or lesions.  NEUROLOGIC: Denies focal extremity numbness, weakness, or tingling.  PSYCHIATRIC: Has history of depression, anxiety, and panic attacks.  ENDOCRINE: Denies polyuria, polydipsia.  HEMOLYMPHATIC: Denies easy bruisability, bleeding, or swollen lymph nodes.  ALLERGY AND IMMUNOLOGIC: Denies seasonal allergies or history of immunodeficiency.   PHYSICAL EXAMINATION:  VITAL SIGNS: Temperature 97.8, pulse 82, respirations 20, blood pressure 131/78, pulse ox 95%.  GENERAL: A well-developed, well-nourished Caucasian female who appears her stated age, currently in no acute distress.  HEENT: Normocephalic, atraumatic. Extraocular movements are intact. Pupils equal, round, and reactive to light. No scleral icterus. Conjunctivae are pink. No epistaxis noted. Gross hearing intact. Oral mucosae are dry.  NECK: Supple without JVD or lymphadenopathy.  LUNGS: Clear to auscultation bilaterally with normal respiratory effort.  HEART: S1, S2, regular rate and rhythm. No murmurs, rubs, or gallops appreciated.  ABDOMEN: Soft. Bowel sounds are present. There is mild bilateral lower quadrant tenderness noted. No rebound or guarding. No gross organomegaly appreciated.  EXTREMITIES: No clubbing, cyanosis, or edema noted.  NEUROLOGIC: Alert and oriented to time, person, and place. Strength is five out of five in both upper and lower extremities.  SKIN: Warm and dry. No rashes noted.  GENITOURINARY: No suprapubic tenderness is noted at this time.  MUSCULOSKELETAL: No joint redness, swelling or tenderness appreciated.  PSYCHIATRIC: With appropriate affect. She appears to be slightly anxious but has good insight.   LABORATORY DATA: Sodium 124,  potassium 4.5, chloride 95, CO2 22, BUN 13, creatinine 1.03, glucose 82, blood osmolality 253, total protein 6.5, albumin 4, total bilirubin 0.4, alkaline phosphatase 116, AST 28, ALT 24. Troponin 0.04. TSH 3.02. CBC shows WBC 4.3, hemoglobin 14, hematocrit 40, and platelets 236. Urine sodium initially was noted to be quite low at 10 urine. Osmolality was 193 upon presentation but today is noted as being 157. UA  shows a specific gravity of 1.006. Serum cortisol is normal at 11.3.   IMPRESSION: This is a 81 year old Caucasian female with past medical history of hypertension, hyperlipidemia, depression, anxiety, panic attacks, coronary disease status post MI with history of bare metal stent to the left circumflex artery in December 2012, history of recurrent GI bleed secondary to AV malformations, chronic kidney disease, stage III, GERD, history of renal cell carcinoma status post right nephrectomy, infrarenal abdominal aortic aneurysm status post surgical repair, who presented to [facility] with hyponatremia.   PROBLEM LIST:  1.  Acute hyponatremia.  2.  Chronic kidney disease, stage III.  3.  Hypertension.  4.  Hyperlipidemia.     PLAN: The patient presented with a very interesting case. She was noted to be hyponatremic upon initial laboratory evaluation. In review of the patient's home medications, it  appears that she was on hydrochlorothiazide triamterene. This was discontinued.   In regards to her labs, she did show a low blood osmolality of 253. Urine osmolality was actually found to be low initially at 193 and when repeated was 157. Urine sodium was also low at 10. The patient was initially started on hydration with 0.9 normal saline; however, it appears that over the past 2 days, serum sodium has remained low at 124.   The low urine osmolality with a low serum sodium suggests that there could be some element of primary polydipsia. Confounding all this, however, is the fact  that the patient was on diuretic therapy. However, we would have expected the urine sodium to be a bit higher in that situation. We will discontinue 0.9 normal saline at this point in time. We will start the patient on 3% saline at 40 mL/hour. We will draw serum sodiums q.4 hours going forward for the next 24 to 48 hours. We will continue the free water restriction as well for now. We will also check a serum uric acid level.   In regards to her underlying chronic kidney disease, stage III, the patient may end up requiring outpatient monitoring of this, as her EGFR currently is noted as being 52. She has a history of nephrectomy. We are in agreement with the continuation of lisinopril for management of her underlying chronic kidney disease.   I would like to thank Dr. Posey Pronto for this kind referral.   Further plan as the patient progresses.    ____________________________ Tama High, MD mnl:[id number] D: 07/15/2013 17:07:00 ET T: 07/15/2013 17:38:36 ET JOB#: [id number]  cc: Tama High, MD, <Dictator> Mariah Milling Starlina Lapre MD ELECTRONICALLY SIGNED 08/19/2013 10:01

## 2015-01-21 NOTE — H&P (Signed)
PATIENT NAME:  Karen, Collins MR#:  [id number] DATE OF BIRTH:  Aug 07, 1934  DATE OF ADMISSION:  07/13/2013  REFERRING PHYSICIAN: Dr. Thomasene Lot.   PRIMARY CARE PHYSICIAN: Dr. Nicki Reaper.   CHIEF COMPLAINT: Abnormal labs per PCP.   HISTORY OF PRESENT ILLNESS: Karen Collins is a 81 year old Caucasian female with past medical history of coronary artery disease status post PCI and stenting, hypertension, irritable bowel syndrome, hyperlipidemia, AAA status post repair, right nephrectomy secondary to renal cell carcinoma who is presenting for abnormal labs. She has been having abnormally low sodium. She was called by her PCP earlier today and told to present to the Emergency Department for hyponatremia. She denies any fevers, chills, nausea, vomiting, diarrhea or decreased p.o. intake. Denies heat or cold intolerance or tremors. She has also been complaining of abdominal discomfort described as a cramping versus soreness in quality which is diffuse around the periumbilical region, nonradiating, no worsening or relieving factors, no change with food intake, for a total of approximately 1 year in duration. She is complaining of these symptoms currently. She was previously diagnosed with IBS. Currently, she is complaining only of mild abdominal discomfort. Otherwise, no complaints.    REVIEW OF SYSTEMS:  CONSTITUTIONAL: Denies fever, fatigue, weakness, pain.  EYES: Denies blurred vision, double vision, eye pain.  ENT: Denies ear pain, hearing loss, discharge.  RESPIRATORY: Denies cough, wheeze, shortness of breath.  CARDIOVASCULAR: Denies chest pain, palpitations, edema.  GASTROINTESTINAL: Denies nausea, vomiting, diarrhea, change in bowel habits. Positive for IBS and abdominal discomfort.  GENITOURINARY: Denies dysuria, hematuria, change in urinary frequency.  ENDOCRINE: Denies nocturia, thyroid problems, increased sweating, heat or cold intolerance or change in thirst.  HEMATOLOGIC AND LYMPHATIC: Denies  easy bruising or bleeding.  SKIN: Denies rashes or lesions.  MUSCULOSKELETAL: Denies pain in her neck, back, shoulders, knees, hips, arthritis or joint swelling.  NEUROLOGIC: Denies paralysis or paresthesias.  PSYCHIATRIC: Denies any anxiety or depressive symptoms.   Otherwise, full review of systems performed by me is negative.   PAST MEDICAL HISTORY: Hypertension, hyperlipidemia, depression, anxiety, panic attacks, coronary artery disease status post MI as well as bare-metal stenting to the left circumflex in December 2011, history of recurrent GI bleed secondary to AV malformations which is why she is not on aspirin and Plavix, chronic kidney disease stage III, gastroesophageal reflux disease, history of renal cell carcinoma status post right nephrectomy, infrarenal abdominal aortic aneurysm status post surgical repair.   FAMILY HISTORY: Father had history of heart disease. Sister with history of breast cancer.   SOCIAL HISTORY: Remote history of tobacco use. Occasional wine intake. No drug usage. Lives alone.   PHYSICAL EXAMINATION:  VITAL SIGNS: Temperature 99.2, pulse 73, respirations 18, blood pressure 142/75, saturating 98% on room air. Weight 66.7 kg, BMI 26.1.  GENERAL: Well-nourished, well-developed, Caucasian female who is currently in no acute distress.  HEAD: Normocephalic, atraumatic.  EYES: Pupils equal, round and reactive to light as well as accommodation. Extraocular muscles intact. No scleral icterus.  MOUTH: Moist mucosal membranes. Dentition intact. No abscesses noted.  EARS, NOSE AND THROAT: Throat clear without exudate. No external lesions.  NECK: Supple. No thyromegaly. No nodules. No lymphadenopathy. No JVD.  PULMONARY: Clear to auscultation bilaterally without wheezes, rubs or rhonchi. No use of accessory muscles. Good respiratory effort.  CHEST: Nontender to palpation.  CARDIOVASCULAR: S1, S2, regular rate and rhythm. No murmurs, rubs or gallops. No pedal edema.  Pedal pulses 2+ bilaterally.  ABDOMEN: Soft, nontender, nondistended. No masses. No hepatosplenomegaly. Positive bowel  sounds.  MUSCULOSKELETAL: No swelling, clubbing or edema. Range of motion is full.  NEUROLOGIC: Cranial nerves II through XII grossly intact. Sensation intact. Reflexes intact.  SKIN: No ulcerations or lesions. No rashes. No cyanosis. Skin is warm and dry. Turgor is intact.  PSYCHIATRIC: Mood and affect: Awake, alert, oriented x3. Insight and judgment are intact.   LABORATORY DATA: Sodium 120. Previous sodium trending since May 2014 are 128, 125, 134 and now currently 120. Potassium 4.1, chloride 86, bicarb 25, BUN 12, creatinine 0.93, glucose 106, calcium 10.2. Osms 242. Total protein6.5 , albumin 4, bilirubin 0.4, alk phos 116, AST 20, ALT 24. Troponin I 0.04. WBC 4.3, hemoglobin 14, platelets of 236. Urinalysis negative for evidence of infection.   ASSESSMENT AND PLAN: A 81 year old Caucasian female with history of coronary artery disease status post percutaneous coronary intervention and stenting with bare-metal stent to the left circumflex, hypertension, irritable bowel syndrome, right nephrectomy secondary to renal cell carcinoma who is presenting with abnormal labs after her primary care physician told her to come to the Emergency Department.  1. Hyponatremia: Asymptomatic. She appears euvolemic at this time. She has received 1 liter of normal saline in the Emergency Department. She will need approximately 179.5 mEq of sodium over the next 24 hours to correct her sodium to 130. She will be placed on normal saline at 50 mL an hour which should increase her sodium appropriately. Will follow basic metabolic panel for sodium level. Check urine sodium, urine osmolality, TSH and a.m. cortisol to determine further etiology, though she does take hydrochlorothiazide which is likely a contributing factor. This will obviously be held.  2. Abdominal cramping/irritable bowel syndrome: Will add  Bentyl to her home medications.  3. Coronary artery disease, status post percutaneous coronary intervention and bare-metal stenting. She is on Lipitor and lisinopril. No aspirin or Plavix secondary to history of gastrointestinal bleed from arteriovenous malformations.  4. Hypertension: Hold hydrochlorothiazide. Continue lisinopril.  5. Anxiety and depression, not otherwise specified: Effexor, Ativan p.r.n. as well as Celexa.  6. Venous thromboembolism prophylaxis: Sequential compression devices.   The patient is FULL CODE.   TIME SPENT: 45 minutes.   ____________________________ Aaron Mose. Hower, MD dkh:gb D: 07/13/2013 23:37:27 ET T: 07/14/2013 00:02:16 ET JOB#: [id number]  cc: Aaron Mose. Hower, MD, <Dictator> DAVID Woodfin Ganja MD ELECTRONICALLY SIGNED 07/14/2013 2:42

## 2015-01-21 NOTE — Discharge Summary (Signed)
PATIENT NAME:  Karen Collins, Karen Collins MR#:  [id number] DATE OF BIRTH:  [birth year]  DATE OF ADMISSION:  07/13/2013 DATE OF DISCHARGE: 07/17/2013.   ADMITTING DIAGNOSIS: Hyponatremia.  DISCHARGE DIAGNOSES: 1. Hyponatremia. Felt to be multifactorial, likely due to a combination of hydrochlorothiazide therapy and triamterene  as well as possible excessive water ingestion status post treatment with 3% normal saline with significant improvement in her sodium.  2. Hypertension.  3. Hyperlipidemia.  4. Depression.  5. Anxiety.  6. Panic attacks.  7. Coronary artery disease, status post myocardial infarction with bare metal stenting to the left circumflex.  8. History of recurrent gastrointestinal bleed secondary to arteriovenous malformations.  9. Chronic kidney disease.  10. Gastroesophageal reflux disease.  11. History of renal cell carcinoma, status post right nephrectomy.  12. Infrarenal abdominal aortic aneurysm status post surgical repair.  13. Likely underlying dementia with some intermittent confusion.   CONSULTANT: Dr. Anthonette Legato.   LABORATORY DATA: Glucose 106, BUN 12, creatinine 0.93, sodium 120, potassium 4.1, chloride 86, CO2 25, calcium 10.2. LFTs were normal. Troponin 0.04. TSH 3.02. WBC 4.3, hemoglobin 14.0, platelet count 236. Cortisol 11.3. Most recent sodium is 132. Urine osmolality was 157. Blood urine osmolality is 253.   HOSPITAL COURSE: Please refer to H and P done by the admitting physician. The patient is a 81 year old white female with history of coronary artery disease, hypertension, irritable bowel syndrome, hyperlipidemia, AAA status post repair, right-sided nephrectomy secondary to renal cell carcinoma, who was seen by a primary care provider and was noted to have a significantly low sodium of 120. Due to these symptoms, we were asked to admit the patient. The patient was initially started on normal saline. Despite that, her sodium did not improve. Therefore,  nephrology consult was obtained. She was placed on 3% sodium. Her urine osmolality was unusually low. Her serum osmology was low as well. It was unexplained, clear etiology, but her hydrochlorothiazide and triamterene should not be ever started. She also started on fluid restriction. Sodium is improved. She is doing much better, stable for discharge. She will follow up with Dr. Inocente Salles as an outpatient.   DISCHARGE CONDITION: Stable.   DISCHARGE MEDICATION: Multivitamin daily, citalopram 40 daily, vitamin E 400 international units daily, Ambien 10 at bedtime, calcium 600+ vitamin D 1 tab daily,  Ativan 1 tab p.o. b.i.d. as needed. Fish oil 1 tab p.o. daily, glucosamine chondroitin 1 tab p.o. daily, Nitrostat 0.4 sublingual p.r.n., Zyrtec 10 daily, vitamin D3 4000 international units daily, Levbid 0.375 mg 1 tab p.o. q.12, loperamide 2 mg daily as needed. Acetaminophen 250 daily as needed, Effexor-XR 75 mg 1 tab p.o. b.i.d., phenylephrine 10 mg q.4 p.r.n., Benadryl 25 daily as needed, Colace 100 mg 1 tab p.o. at bedtime, Advil 200 mg 3 tabs daily as needed, simvastatin 40 at bedtime.   ACTIVITY: As tolerated.   DIET: Regular.   FOLLOWUP: With primary M.D. in 1 to 2 weeks. Follow with Dr. Anthonette Legato in two weeks. The patient is told to stop taking hydrochlorothiazide triamterene.  Total fluid restriction of 1500 mL. BMP next week at the time of visit to primary M.D.   TIME SPENT: 45 minutes spent on the discharge.    ____________________________ Lafonda Mosses. Posey Pronto, MD shp:sg D: 07/17/2013 14:35:40 ET T: 07/17/2013 15:03:04 ET JOB#: [id number]  cc: Whitley Patchen H. Posey Pronto, MD, <Dictator> Alric Seton MD ELECTRONICALLY SIGNED 07/25/2013 10:25

## 2015-01-23 NOTE — Consult Note (Signed)
General Aspect 81 year old female with a past medical history of coronary artery disease, recent MI with occlusion of her left circumflex on September 18 2010 with transfer from [facility] to [facility] with stenting of her mid circumflex with 8 mm x 2.75 mm vision stent with overlapping 2.5 x 18 mm vision non-drug-eluting platform, residual 70-80% disease in her RCA, previous history of AV malformations in the proximal colon, who presented with lower GI bleed to [facility] in late December requiring transfusion with readmission to the hospital in early January for continued lower GI bleed. She presents for chest pain, SOB. Cardiology was consulted for elevated cardiac enz, chest pain.  She was last seen in the clinic 03/2011. At that time, she was pain free, only on aspirin 81 mg daily.   She reports increasing fatigue over the past few months, particularly the past month, cough x 3 weeks. No edema, orthopnea, PND. She was having a shower last night when she felt weak, lay down on the couch and had chest pressure similar to previous MI. "Not as bad". She called a neighbor and  911 was called. no further chest pain overnight on heparin.  Previous stress test showed a large region of scar in the inferior and inferolateral wall. Poor exercise tolerance.    Present Illness . FAMILY HISTORY: Mother is still living at the age of 32, history of arthritis. Father had heart disease, died at the age of 84. Sister with breast cancer.   SOCIAL HISTORY: She quit tobacco in 2000. Has occasional wine use. No drug use. She lives in [place] alone.   Physical Exam:   GEN well developed, well nourished, no acute distress    HEENT red conjunctivae    NECK supple    RESP clear BS    CARD Regular rate and rhythm  No murmur    ABD denies tenderness  soft    LYMPH negative neck    EXTR negative edema    SKIN normal to palpation    NEURO cranial nerves intact, motor/sensory function intact    PSYCH  alert, A+O to time, place, person, good insight   Review of Systems:   Subjective/Chief Complaint chest pain    General: Fatigue  Weakness    Skin: No Complaints    ENT: No Complaints    Eyes: No Complaints    Neck: No Complaints    Respiratory: No Complaints    Cardiovascular: Chest pain or discomfort    Gastrointestinal: No Complaints    Genitourinary: No Complaints    Musculoskeletal: No Complaints    Neurologic: No Complaints    Hematologic: No Complaints    Endocrine: No Complaints    Psychiatric: Anxiety    Review of Systems: All other systems were reviewed and found to be negative    Medications/Allergies Reviewed Medications/Allergies reviewed     CAD:    Kidney Insuffiency:    Anemia:    Depression:    MI - Myocardial Infarct: 17-Sep-2010   AAA:    Hyperlipidemia:    HTN:    Kidney Stones:    Right Cataract Surgery: Jun 2012   Right shoulder replacement:    Aortic Stent:    Cardiac Stent:    EVT AAA:    right nephrectomy:    excision of para thyroid gland:        Admit Diagnosis:   MYOCARDIAL INFARCTION: 26-Mar-2012, Active, MYOCARDIAL INFARCTION      Admit Reason:   Non-Q wave myocardial  infarction: (410.70) Active, ICD9, Acute myocardial infarction, subendocardial infarction, episode of care unspecified  Home Medications: Medication Instructions Status  Fish Oil 1200 mg oral capsule 1 cap(s) orally once a day (in the morning) Active  Glucosamine & Chondroitin with Magnesium /Vitamin C 1 tab(s) orally once a day (in the morning) Active  Ativan 1 mg oral tablet 1 tab(s) orally every 4 hours, As Needed Active  multivitamin 1 tab(s) orally once a day (in the morning) Active  Nitrostat 0.4 mg sublingual 1 tab(s) sublingual every 5 min. As Needed Active  Dyazide 37.5-25 mg oral capsule 1 cap(s) orally once a day (in the morning) Active  simvastatin 40 mg oral tablet 1 tab(s) orally once a day (at bedtime) Active  citalopram  40 mg oral tablet 1 tab(s) orally once a day (in the morning) for nerves Active  vitamin E 400 intl units oral capsule 1 cap(s) orally once a day Active  Calcium 600+D 500 milligram(s) orally once a day Active  acetaminophen extended release 650 milligram(s) orally , As Needed Active  carvedilol 3.125 mg oral tablet 1 tab(s) orally 2 times a day Active  zolpidem 10 mg oral tablet 1 tab(s) orally once a day (at bedtime) Active  docusate sodium 100 mg oral capsule 1 cap(s) orally once a day as needed Active  Vitamin D3 400 intl units oral tablet tab(s) orally once a day Active  Probiotic Formula oral capsule 1 cap(s) orally once a day Active  aspirin 81 mg oral tablet 1 tab(s) orally once a day Active  Benadryl 25 mg oral tablet tab(s) orally , As Needed Active  Niferex-150 oral capsule 1 cap(s) orally once a day Active  lisinopril 2.5 mg oral tablet 1 tab(s) orally once a day Active  ASA/BUTAL/CAFF   325-50-40 MG CAP FOR HEADACHES ONLY Active   Lab Results:  Cardiology:  25-Jun-13 21:47    Ventricular Rate 76   Atrial Rate 76   P-R Interval 182   QRS Duration 102   QT 426   QTc 479   P Axis 45   R Axis 1   T Axis 86   ECG interpretation Normal sinus rhythm Possible Lateral infarct , age undetermined Inferior-posterior infarct (cited on or before 27-Sep-2010) Abnormal ECG When compared with ECG of 05-Oct-2010 17:41, Fusion complexes are no longer Present QRS duration has increased Borderline criteria for Lateral infarct are now Present QT has lengthened ----------unconfirmed---------- Confirmed by OVERREAD, NOT ([id number]), editor PEARSON, BARBARA ([id number]) on 03/26/2012 8:39:57 AM  Routine Chem:  25-Jun-13 21:47    Result Comment troponin - RESULTS VERIFIED BY REPEAT TESTING.  - mpg c/ greg moyer @ [id number] 03/25/12  - READ-BACK PROCESS PERFORMED.  Result(s) reported on 25 Mar 2012 at 11:11PM.   Glucose, Serum 92   BUN 14   Creatinine (comp) 1.27   Sodium, Serum  135   Potassium, Serum  3.5   Chloride, Serum 101   CO2, Serum 27   Calcium (Total), Serum 9.5   Anion Gap 7   Osmolality (calc) 270   eGFR (African American)  48   eGFR (Non-African American)  41 (eGFR values <41m/min/1.73 m2 may be an indication of chronic kidney disease (CKD). Calculated eGFR is useful in patients with stable renal function. The eGFR calculation will not be reliable in acutely ill patients when serum creatinine is changing rapidly. It is not useful in  patients on dialysis. The eGFR calculation may not be applicable to patients at the low and high extremes  of body sizes, pregnant women, and vegetarians.)  26-Jun-13 05:44    Result Comment TROPONIN - RESULTS VERIFIED BY REPEAT TESTING.  - PREVIOUS CALL:03/25/12@2310 .Marland KitchenMarland KitchenTPL  Result(s) reported on 26 Mar 2012 at 06:39AM.  Cardiac:  25-Jun-13 21:47    CK, Total 66   CPK-MB, Serum 2.7 (Result(s) reported on 25 Mar 2012 at 10:54PM.)   Troponin I  0.16 (0.00-0.05 0.05 ng/mL or less: NEGATIVE  Repeat testing in 3-6 hrs  if clinically indicated. >0.05 ng/mL: POTENTIAL  MYOCARDIAL INJURY. Repeat  testing in 3-6 hrs if  clinically indicated. NOTE: An increase or decrease  of 30% or more on serial  testing suggests a  clinically important change)  26-Jun-13 05:44    CK, Total 79   CPK-MB, Serum  8.2 (Result(s) reported on 26 Mar 2012 at 06:36AM.)   Troponin I  1.00 (0.00-0.05 0.05 ng/mL or less: NEGATIVE  Repeat testing in 3-6 hrs  if clinically indicated. >0.05 ng/mL: POTENTIAL  MYOCARDIAL INJURY. Repeat  testing in 3-6 hrs if  clinically indicated. NOTE: An increase or decrease  of 30% or more on serial  testing suggests a  clinically important change)  Routine Hem:  25-Jun-13 21:47    WBC (CBC) 3.8   RBC (CBC) 4.15   Hemoglobin (CBC) 12.9   Hematocrit (CBC) 38.7   Platelet Count (CBC) 196 (Result(s) reported on 25 Mar 2012 at 10:30PM.)   MCV 93   MCH 31.1   MCHC 33.3   RDW 13.6   EKG:   Interpretation NSR with rate  76 bpm, no significant ST or T wave changes, consider old lateral and inferior wall MI.    Tramadol: Itching, Rash  Vital Signs/Nurse's Notes: **Vital Signs.:   26-Jun-13 08:49   Vital Signs Type Admission   Temperature Temperature (F) 98.5   Celsius 36.9   Temperature Source Oral   Pulse Pulse 68   Pulse source per vital sign device   Respirations Respirations 18   Systolic BP Systolic BP 324   Diastolic BP (mmHg) Diastolic BP (mmHg) 78   Mean BP 93   Pulse Ox % Pulse Ox % 95   Oxygen Delivery Room Air/ 21 %     Impression 81 year old female with a past medical history of coronary artery disease, recent MI with occlusion of her left circumflex on September 18 2010 with transfer from [facility] to [facility] with stenting of her mid circumflex with 8 mm x 2.75 mm vision stent with overlapping 2.5 x 18 mm vision non-drug-eluting platform, residual 70-80% disease in her RCA, previous history of AV malformations in the proximal colon, who presented with lower GI bleed to [facility] in late December 2011 requiring transfusion with readmission to the hospital in early January 2012 for continued lower GI bleed. She presents for chest pain, SOB. Cardiology was consulted for elevated cardiac enz, chest pain.  1) Elevated cardiac enz/NSTEMI Chest pain last night, sx similar to previous MI per the patient. Recent increase in weakness  troponin of 1 On heparin, continue coreg, Aspirin Bleed precautions No new significant EKG ABN noted. Uncertain if she has stent restenosis or preogression of RCA disease.  -->Cardiac cath scheduled for 12:30 today  2) Hx of GI bleed, AV malformation Significant GI bleeding issues after last bare metal stent was placed. If stent needed, will need to monitor closely, get Dr. Gustavo Lah involved.  3) HTN: Continue outpt meds:  4) Hyperlipidemia Continue simvastatin  5)AAA, s/p stent  6) Anxiety On celxa and atrivan  PRN   Electronic Signatures: Ida Rogue (MD)  (Signed 26-Jun-13 09:43)  Authored: General Aspect/Present Illness, History and Physical Exam, Review of System, Past Medical History, Health Issues, Home Medications, Labs, EKG , Allergies, Vital Signs/Nurse's Notes, Impression/Plan   Last Updated: 26-Jun-13 09:43 by Ida Rogue (MD)

## 2015-01-23 NOTE — Discharge Summary (Signed)
PATIENT NAME:  Karen Collins, Karen Collins MR#:  [id number] DATE OF BIRTH:  06-Aug-1934  DATE OF ADMISSION:  03/26/2012 DATE OF DISCHARGE:  03/27/2012  DIAGNOSES:  1. Non-ST elevation myocardial infarction.  2. Chronic kidney disease, stage III.  3. Hypertension. 4. Depression, anxiety. 5. Gastroesophageal reflux disease. 6. Possible chronic systolic congestive heart failure with ejection fraction of 35%.  7. History of coronary artery disease, status post myocardial infarction and left circumflex stents in 2011 with subsequent thrombosis. 8. Hyperlipidemia. 9. Arthritis. 10. Recurrent gastrointestinal bleeds due to arteriovenous malformation. 11. Renal cancer, status post right nephrectomy. 12. Abdominal aortic aneurysm status post repair. 13. Kidney stones.   CONSULTATIONS: Cardiology consultation with Dr. Fletcher Anon and [name].   PROCEDURE: Cardiac catheterization which showed occluded left circumflex at the site of the stent with collaterals to the distal left circumflex.   LABORATORY, DIAGNOSTIC AND RADIOLOGICAL DATA: Chest x-ray showed no acute cardiopulmonary disease. Echo showed left ventricular systolic function is moderately reduced. Ejection fraction 30% to 35%, impaired LV relaxation, severe posterior wall hypokinesis, severe inferior wall hypokinesis. The left atrium is mildly dilated. Moderate MR, mild TR, right ventricular systolic pressure is 30 to 40 mmHg. Urinalysis negative. CBC normal. BUN and creatinine normal. Sodium 135, VLDL 25, LDL 99, triglycerides 125, HDL 46, cholesterol 170. Cardiac enzymes: Peak troponin 1.0. TSH normal.   HOSPITAL COURSE: Patient is a 81 year old female with past medical history of NSTEMI/coronary artery disease with left circumflex stent in 2001. Patient developed severe GI bleed due to AVM and had to be taken off anticoagulants. She presented to the hospital with shortness of breath and was found to have NSTEMI with elevated troponins. Patient  underwent catheterization which showed her left circumflex to be chronically occluded with good collaterals. Her prior stents which were placement in 2011 were occluded. Patient was evaluated by Dr. Fletcher Anon and [name] who recommended medical management. They advised no Plavix since patient had a history of GI bleed. Patient likely has chronic systolic congestive heart failure with low ejection fraction of 35% on echo. There was no evidence of exacerbation during the hospitalization. Patient is being discharged home in a stable condition.   DIET: Low sodium.   ACTIVITY: As tolerated.   FOLLOW UP: Follow up with Dr. Fletcher Anon in one week. Follow up with primary care physician, Dr. Arline Asp, in 1 to 2 weeks. Patient has been referred to cardiac rehabilitation.   DISCHARGE MEDICATIONS:  1. Vitamin D3 400 international units daily.  2. Probiotic 1 capsule daily.  3. Aspirin 81 mg daily.  4. Niferex 150 mg daily.  5. Lisinopril 2.5 mg daily.  6. Colace 100 mg as needed.  7. Ambien 10 mg at bedtime.  8. Coreg 3.125 mg b.i.d.  9. Tylenol 650 mg as needed.  10. Calcium and vitamin D 500 mg once a day. 11. Citalopram 40 mg daily.  12. Simvastatin 40 mg daily.  13. Dyazide 37.5/25, 1 tablet once a day. 14. Nitroglycerin 0.4 mg sublingual q.5 minutes as needed.  15. Multivitamin 1 tablet daily.  16. Ativan 1 mg q.4 hours p.r.n.  17. Fish oil 1200 mg daily.   Time spent 45 min  ____________________________ Cherre Huger, MD sp:cms D: 03/28/2012 14:54:01 ET T: 03/28/2012 15:28:52 ET  JOB#: [id number] cc: Vianne Bulls. Arline Asp, MD Cherre Huger MD ELECTRONICALLY SIGNED 03/29/2012 7:13

## 2015-01-23 NOTE — H&P (Signed)
PATIENT NAME:  Karen Collins, FOLKERTS MR#:  [id number] DATE OF BIRTH:  Oct 21, 1933  DATE OF ADMISSION:  03/26/2012  REFERRING PHYSICIAN: Dr. Owens Shark   PRIMARY PHYSICIAN: Dr. Arline Asp    PRIMARY CARDIOLOGIST: [facility]    PRESENTING COMPLAINT: Chest tightness, shortness of breath.   HISTORY OF PRESENT ILLNESS: Karen Collins is a 81 year old woman with history of hypertension, hyperlipidemia, coronary artery disease status post circumflex stent, history of recurrent GI bleed, chronic kidney disease stage III who presents with complaints of developing shortness of breath and chest tightness when she came out of the shower. She took nitroglycerin sublingual x3 with improvement in her symptoms and now it's completely subsided. She denies any palpitations or syncope. Reports that this is similar to her past MI. She has also multiple complaints, has not been feeling well in general. Her arthritis has been causing pain mainly in her back, shoulders, and joints. She has been having abdominal swelling and discomfort since her abdominal hernia surgery and has been evaluated by Dr. Bary Castilla. In review of records it appears that patient was on Plavix and aspirin when she had her non-drug-eluting stent placed, however, it seems that this was discontinued as she had recurrent GI bleed and anemia.   PAST MEDICAL HISTORY:  1. Hypertension.  2. Hyperlipidemia.  3. Migraines.  4. Depression, anxiety, and panic attacks.  5. Coronary artery disease, status post MI and non-drug-eluting stent to the circumflex December 2011.  6. Arthritis.  7. History of telangiectasias and AVMs with history of recurrent GI bleed perpetuated by aspirin and Plavix. The patient had an EGD in March 2012 that showed nonbleeding duodenal angioectasia and a colonoscopy with angiodysplastic lesions status post fulguration and noted diverticulosis.  8. Chronic kidney disease, stage III.  9. Gastroesophageal reflux disease.  10. Renal cancer,  status post right nephrectomy.  11. Infrarenal abdominal aortic aneurysm, status post surgical repair.  12. Nephrolithiasis.  13. Per report, patient with memory deficit.   PAST SURGICAL HISTORY: In addition to above, she also had a: 1. Ventral hernia repair in September 2012. 2. Right cataract.   ALLERGIES: Tramadol.   MEDICATIONS:  1. Acetaminophen 650 mg as needed.  2. Aspirin 81 mg daily.  3. Ativan 1 mg every four hours as needed.  4. Calcium 600 Plus D 500 mg daily.  5. Carvedilol 3.125 b.i.d.  6. Citalopram 40 mg daily.  7. Docusate 100 mg as needed.  8. Dyazide 37.5/25 mg 1 capsule every morning.  9. Fish Oil 1200 mg daily.  10. Glucosamine/chondroitin with magnesium and Vitamin C tablet daily.  11. Multivitamin daily.  12. Nitrostat sublingual 0.4 mg every five minutes as needed.  13. Probiotic daily.  14. Simvastatin 40 mg daily.  15. Vitamin D3 400 international units daily.  16. Vitamin E 400 international units daily.  17. Zolpidem 10 mg at bedtime.   FAMILY HISTORY: Mother is still living at the age of 63, history of arthritis. Father had heart disease, died at the age of 36. Sister with breast cancer.   SOCIAL HISTORY: She quit tobacco in 2000. Has occasional wine use. No drug use. She lives in [place] alone.   REVIEW OF SYSTEMS: CONSTITUTIONAL: No fevers, nausea, or vomiting. EYES: History of cataracts. ENT: No epistaxis or discharge. RESPIRATORY: No cough or hemoptysis. CARDIOVASCULAR: As per history of present illness. GI: Endorses intermittent diarrhea, abdominal pain and swelling ever since her hernia surgery. No hematemesis or bright red blood per rectum. GU: She has increased urination with  vaginal discomfort with urination and irritation. ENDOCRINE: No polyuria or polydipsia. HEME: No bleeding. SKIN: No ulcers. MUSCULOSKELETAL: She has issues with her arthritis and flare. NEUROLOGIC: No history of stroke or seizures. PSYCH: Denies any suicidal ideation.  She has ongoing depression and anxiety.   PHYSICAL EXAMINATION:   VITAL SIGNS: Temperature 97.3, pulse 75, respiratory rate 14, blood pressure 107/60, sating at 94% on room air.   GENERAL: Lying in bed in no apparent distress.   HEENT: Normocephalic, atraumatic. Pupils are equal and symmetric, nonicteric. Nares without discharge. Moist mucous membrane.   NECK: Soft and supple. No adenopathy or JVP.   CARDIOVASCULAR: Non-tachy. No murmurs, rubs, or gallops.   LUNGS: Clear to auscultation bilaterally. No use of accessory muscles or increased respiratory effort.   ABDOMEN: Soft with mild tenderness in the right lower quadrant. No rebound or guarding. No mass appreciated.   EXTREMITIES: No edema. Dorsal pedis pulses intact.   MUSCULOSKELETAL: No joint effusion.   SKIN: No ulcers.   NEUROLOGIC: No dysarthria or aphasia. Symmetrical strength.   PSYCH: She is alert and oriented. The patient is cooperative.   PERTINENT LABS AND STUDIES: WBC 3.8, hemoglobin 12.9, hematocrit 38.7, platelets 196, MCV 93, glucose 92, BUN 14, creatinine 1.27, sodium 135, potassium 3.5, chloride 101, carbon dioxide 27, calcium 9.5. Troponin 0.16. CK 66. MB 2.7.   EKG with sinus rate of 76. There is Q wave in 1, 2, 3 aVF and V4, V5, V6. No ST elevation or depression.   ASSESSMENT AND PLAN: Ms. Karen Collins is a 81 year old woman with history of coronary artery disease status post MI and circumflex stent, hypertension, hyperlipidemia, depression, anxiety, arthritis, recurrent GI bleed exacerbated by aspirin and Plavix, history of renal cell cancer status post nephrectomy, abdominal aortic aneurysm status post repair presenting with shortness of breath and chest pain.  1. Non-ST elevation MI. Continue on tele. Cycle cardiac enzymes. Resume carvedilol and aspirin. Start on heparin drip with close following of blood count and blood in stool. Continue nitroglycerin sublingual as needed, oxygen saturation, and simvastatin.  Will obtain Cardiology evaluation for potential cath. She has history of GI bleed on aspirin and Plavix, therefore, will follow closely with anticoagulation.  2. Chronic kidney disease stage III, stable.  3. Hypertension. Resume her Dyazide and carvedilol.  4. Depression and anxiety. Resume Ativan and citalopram.  5. Intermittent diarrhea, sounds potentially irritable bowel syndrome. Seems issues may have occurred more so after her hernia repair. Will send stool studies.  6. Prophylaxis with aspirin, heparin drip, and Protonix.   TIME SPENT: Approximately 50 minutes spent on patient care.    ____________________________ Rita Ohara, MD ap:drc D: 03/26/2012 02:19:00 ET T: 03/26/2012 08:54:40 ET JOB#: [id number]  cc: Brien Few Anyae Griffith, MD, <Dictator> Vianne Bulls. Arline Asp, MD Rita Ohara MD ELECTRONICALLY SIGNED 04/16/2012 21:15

## 2015-02-02 ENCOUNTER — Other Ambulatory Visit: Payer: Self-pay | Admitting: Internal Medicine

## 2015-02-02 NOTE — Telephone Encounter (Signed)
Before refilling the medication, please call pts sister Inez Catalina and see if she has seen psychiatry yet.  We had referred.  If she has, they may be prescribing medication.  If has not seen, let me know.  Will get refilled and need to see about appt.

## 2015-02-02 NOTE — Telephone Encounter (Signed)
Last OV 2.12.16, last refill 3.3.16.  Please advise refill

## 2015-02-03 NOTE — Telephone Encounter (Signed)
I spoke with the pt, she states she did see Dr Gretel Acre and was given an Rx for Lorazepam by her.  I advised refills should come from her, pt verbalized understanding states she will call Dr Anola Gurney office for refill.  I spoke with pt as I was not able to contact her sister.

## 2015-03-03 ENCOUNTER — Ambulatory Visit (INDEPENDENT_AMBULATORY_CARE_PROVIDER_SITE_OTHER): Payer: Commercial Managed Care - HMO | Admitting: Psychiatry

## 2015-03-03 ENCOUNTER — Other Ambulatory Visit: Payer: Self-pay

## 2015-03-03 ENCOUNTER — Encounter: Payer: Self-pay | Admitting: Psychiatry

## 2015-03-03 VITALS — BP 100/72 | HR 94 | Temp 98.2°F | Ht 63.0 in | Wt 142.2 lb

## 2015-03-03 DIAGNOSIS — F1024 Alcohol dependence with alcohol-induced mood disorder: Secondary | ICD-10-CM

## 2015-03-03 DIAGNOSIS — F331 Major depressive disorder, recurrent, moderate: Secondary | ICD-10-CM

## 2015-03-03 MED ORDER — TRAZODONE HCL 50 MG PO TABS: 50.0000 mg | ORAL_TABLET | Freq: Every day | ORAL | Status: DC

## 2015-03-03 MED ORDER — CITALOPRAM HYDROBROMIDE 20 MG PO TABS: 40.0000 mg | ORAL_TABLET | Freq: Every day | ORAL | Status: DC

## 2015-03-03 MED ORDER — LORAZEPAM 0.5 MG PO TABS: 0.5000 mg | ORAL_TABLET | Freq: Every day | ORAL | Status: DC

## 2015-03-03 NOTE — Progress Notes (Signed)
BH MD/PA/NP OP Progress Note  03/03/2015 1:37 PM Karen Collins  MRN:  [id number]  Subjective:    Pt is a 81 yo female who presented for follow up.   Patient reported that she has been having some issues with her memory and she's concerned about the same. She reported that she did not do well on lorazepam half pill twice daily so she decided to take the full pill in the morning. She reported that she also drinks at the happy hour on the frequent basis. She currently lives by herself and her sisters live close by. She was trying to minimize the use of alcohol at this time. She reported that she is planning to go to [place] to see her children who all are settled in [place]. Patient currently denied having any more swings anger anxiety and reported that the medications are helping her. She is taking trazodone at bedtime and is sleeping well with the medication. She does not want to change her medication as she feels that lorazepam is really helping her and she wants to continue the medication on a regular basis. She denied having any suicidal ideations or plans. Chief Complaint:  Chief Complaint    Depression; Anxiety     Visit Diagnosis:  No diagnosis found.  Past Medical History:  Past Medical History  Diagnosis Date  . Hyperlipidemia   . Hypertension   . CAD (coronary artery disease)   . MI (myocardial infarction)     Non-ST elevation MI  . GI bleed   . Renal cell carcinoma   . Anxiety   . Chronic kidney disease (CKD), stage III (moderate)   . Depression   . GERD (gastroesophageal reflux disease)   . Chronic systolic congestive heart failure     EF of 35 %   . Arthritis   . AAA (abdominal aortic aneurysm)     s/p stent   . Kidney stones   . Diverticulitis     H/O  . Hx of migraines   . Thyroid disease   . Hx: UTI (urinary tract infection)     Past Surgical History  Procedure Laterality Date  . Cardiac catheterization    . Cardiac catheterization  09/18/2010  .  Coronary stent placement  09/18/2010    CAD; MI s/p stent  . Nephrectomy  10/2008    Right  . Shoulder surgery  07/2010    Right  . Abdominal aortic aneurysm repair      s/p stent  . Cholecystectomy    . Thyroidectomy    . Laparoscopic partial nephrectomy  2010  . Parathyroidectomy    . Abdominal aortic endovascular stent graft     Family History:  Family History  Problem Relation Age of Onset  . Arthritis Mother   . Heart disease Mother   . Pulmonary fibrosis Mother   . Heart disease Father   . Hodgkin's lymphoma Father   . Breast cancer Sister   . Hyperlipidemia Sister    Social History:  History   Social History  . Marital Status: Divorced    Spouse Name: N/A  . Number of Children: N/A  . Years of Education: N/A   Occupational History  . Retired    Social History Main Topics  . Smoking status: Former Smoker -- 1.00 packs/day for 30 years    Quit date: 10/01/2005  . Smokeless tobacco: Never Used  . Alcohol Use: 4.2 oz/week    3 Standard drinks or equivalent, 4 Glasses of  wine per week     Comment: Wine 2-3 times weekly  . Drug Use: No  . Sexual Activity: No   Other Topics Concern  . None   Social History Narrative   Married   Psychologist, counselling occasionally with house work   Additional History: Lives alone and drinks regularly   Assessment:   Musculoskeletal: Strength & Muscle Tone: within normal limits Gait & Station: normal Patient leans: N/A  Psychiatric Specialty Exam: HPI  Review of Systems  Constitutional: Negative for fever and weight loss.  HENT: Negative for hearing loss and nosebleeds.   Eyes: Negative for photophobia.  Respiratory: Negative for sputum production.   Cardiovascular: Positive for orthopnea.  Gastrointestinal: Negative for vomiting and diarrhea.  Genitourinary: Negative for hematuria.  Neurological: Positive for sensory change. Negative for tingling.  Endo/Heme/Allergies: Negative for environmental allergies.   Psychiatric/Behavioral: Positive for depression. Negative for suicidal ideas and hallucinations. The patient is nervous/anxious.     Blood pressure 100/72, pulse 94, temperature 98.2 F (36.8 C), temperature source Tympanic, height 5\' 3"  (1.6 m), weight 142 lb 3.2 oz (64.501 kg), SpO2 96 %.Body mass index is 25.2 kg/(m^2).  General Appearance: Fairly Groomed  Eye Contact:  good  Speech:  Normal Rate  Volume:  Normal   Mood:  Anxious  Affect:  Congruent  Thought Process:  Logical  Orientation:  Full (Time, Place, and Person)  Thought Content:  WDL  Suicidal Thoughts:  No  Homicidal Thoughts:  No  Memory:  Immediate;   Fair  Judgement:  Fair  Insight:  Fair  Psychomotor Activity:  Normal  Concentration:  Fair  Recall:  [employer] of Knowledge: Fair  Language: Fair  Akathisia:  No  Handed:  Right  AIMS (if indicated):  none  Assets:  Communication Skills Desire for Improvement Physical Health  ADL's:  Intact  Cognition: WNL  Sleep:  6-7    Is the patient at risk to self?  No. Has the patient been a risk to self in the past 6 months?  No. Has the patient been a risk to self within the distant past?  No. Is the patient a risk to others?  No. Has the patient been a risk to others in the past 6 months?  No. Has the patient been a risk to others within the distant past?  No.  Current Medications: Current Outpatient Prescriptions  Medication Sig Dispense Refill  . aspirin EC 81 MG tablet Take 81 mg by mouth daily.    Marland Kitchen atorvastatin (LIPITOR) 80 MG tablet Take 80 mg by mouth daily.    . bisacodyl (DULCOLAX) 5 MG EC tablet Take 5 mg by mouth as needed.    . calcium carbonate (CALCIUM 600) 600 MG TABS Take 600 mg by mouth daily.      . carvedilol (COREG) 3.125 MG tablet Take 3.125 mg by mouth 2 (two) times daily.    . cetirizine (ZYRTEC) 10 MG tablet Take 10 mg by mouth daily.    . Cholecalciferol (VITAMIN D) 400 UNITS capsule Take 400 Units by mouth daily.    . citalopram  (CELEXA) 40 MG tablet TAKE ONE (1) TABLET EACH DAY 30 tablet 2  . Cyanocobalamin 1000 MCG TBCR Take 1 tablet by mouth daily.    Marland Kitchen dicyclomine (BENTYL) 10 MG capsule Take 10 mg by mouth 3 (three) times daily.    . diphenhydrAMINE (BENADRYL) 25 MG tablet Take 25 mg by mouth daily as needed.      . docusate  sodium (COLACE) 100 MG capsule Take 100 mg by mouth daily as needed.    Marland Kitchen ibuprofen (ADVIL,MOTRIN) 600 MG tablet Take 600 mg by mouth as needed.    Marland Kitchen lisinopril (PRINIVIL,ZESTRIL) 5 MG tablet Take 1 tablet (5 mg total) by mouth daily. 90 tablet 1  . LORazepam (ATIVAN) 1 MG tablet TAKE ONE (1) TABLET EACH DAY AS NEEDED FOR ANXIETY 30 tablet 0  . Multiple Vitamin (MULTIVITAMIN) tablet Take 1 tablet by mouth daily.      . Omega-3 Fatty Acids (FISH OIL) 1200 MG CAPS Take 1,200 mg by mouth daily.    . simvastatin (ZOCOR) 40 MG tablet Take 1 tablet (40 mg total) by mouth at bedtime. 90 tablet 1  . traZODone (DESYREL) 50 MG tablet Take 1 tablet by mouth daily.    Marland Kitchen triamterene-hydrochlorothiazide (DYAZIDE) 37.5-25 MG per capsule Take 1 capsule by mouth daily.    Marland Kitchen venlafaxine (EFFEXOR) 75 MG tablet Take 1 tablet (75 mg total) by mouth 2 (two) times daily. 180 tablet 0  . vitamin E 400 UNIT capsule Take 400 Units by mouth daily.    Marland Kitchen zolpidem (AMBIEN) 10 MG tablet Take 10 mg by mouth as needed.    Marland Kitchen acetaminophen (TYLENOL) 500 MG tablet Take 325 mg by mouth as needed.     . Glucosamine-Chondroit-Vit C-Mn (GLUCOSAMINE 1500 COMPLEX) CAPS Take by mouth.      Marland Kitchen glucosamine-chondroitin 500-400 MG tablet Take 1 tablet by mouth daily.    . hyoscyamine (LEVBID) 0.375 MG 12 hr tablet Take 0.375 mg by mouth every 12 (twelve) hours as needed for cramping.    . loperamide (ANTI-DIARRHEAL) 2 MG tablet Take 2 mg by mouth as needed.    Marland Kitchen LORazepam (ATIVAN) 0.5 MG tablet 1 tablet daily.    . nitroGLYCERIN (NITROSTAT) 0.4 MG SL tablet Place 1 tablet (0.4 mg total) under the tongue every 5 (five) minutes as needed.  (Patient not taking: Reported on 03/03/2015) 30 tablet 1  . phenylephrine (SUDAFED PE) 10 MG TABS tablet Take 10 mg by mouth as needed.     No current facility-administered medications for this visit.    Medical Decision Making:  Established Problem, Stable/Improving (1)  Treatment Plan Summary:Medication management Discussed with patient at length about the medications treatment risk benefits and alternatives. Advised her to decrease the use of alcohol as well as the benzodiazepine is to improve her memory and she agreed with the plan. She will be given lorazepam 0.5 mg in the morning and was given written instructions to decrease the use of alcohol especially at the happy hour She will follow-up in one to 2 months depending on her symptoms She was advised to call if she notices worsening of her symptoms and patient demonstrated understanding    More than 50% of the time spent in psychoeducation, counseling and coordination of care.    This note was generated in part or whole with voice recognition software. Voice regonition is usually quite accurate but there are transcription errors that can and very often do occur. I apologize for any typographical errors that were not detected and corrected.      Rainey Pines 03/03/2015, 1:37 PM

## 2015-03-14 ENCOUNTER — Encounter: Payer: Self-pay | Admitting: Internal Medicine

## 2015-03-14 ENCOUNTER — Ambulatory Visit (INDEPENDENT_AMBULATORY_CARE_PROVIDER_SITE_OTHER): Payer: Commercial Managed Care - HMO | Admitting: Internal Medicine

## 2015-03-14 VITALS — BP 132/76 | HR 86 | Temp 98.1°F | Resp 14 | Ht 63.0 in | Wt 140.8 lb

## 2015-03-14 DIAGNOSIS — R413 Other amnesia: Secondary | ICD-10-CM

## 2015-03-14 DIAGNOSIS — R1084 Generalized abdominal pain: Secondary | ICD-10-CM

## 2015-03-14 DIAGNOSIS — I2510 Atherosclerotic heart disease of native coronary artery without angina pectoris: Secondary | ICD-10-CM | POA: Diagnosis not present

## 2015-03-14 DIAGNOSIS — Z0000 Encounter for general adult medical examination without abnormal findings: Secondary | ICD-10-CM

## 2015-03-14 DIAGNOSIS — I714 Abdominal aortic aneurysm, without rupture, unspecified: Secondary | ICD-10-CM

## 2015-03-14 DIAGNOSIS — N183 Chronic kidney disease, stage 3 unspecified: Secondary | ICD-10-CM

## 2015-03-14 DIAGNOSIS — E213 Hyperparathyroidism, unspecified: Secondary | ICD-10-CM | POA: Diagnosis not present

## 2015-03-14 DIAGNOSIS — F419 Anxiety disorder, unspecified: Secondary | ICD-10-CM

## 2015-03-14 DIAGNOSIS — I10 Essential (primary) hypertension: Secondary | ICD-10-CM

## 2015-03-14 DIAGNOSIS — G479 Sleep disorder, unspecified: Secondary | ICD-10-CM

## 2015-03-14 DIAGNOSIS — F329 Major depressive disorder, single episode, unspecified: Secondary | ICD-10-CM

## 2015-03-14 DIAGNOSIS — F32A Depression, unspecified: Secondary | ICD-10-CM

## 2015-03-14 DIAGNOSIS — E7800 Pure hypercholesterolemia, unspecified: Secondary | ICD-10-CM

## 2015-03-14 NOTE — Progress Notes (Signed)
Patient ID: Karen Collins, female   DOB: [birth year], 81 y.o.   MRN: [id number]   Subjective:    Patient ID: Karen Collins, female    DOB: [birth year], 81 y.o.   MRN: [id number]  HPI  Patient here for a scheduled follow up.  She is accompanied by her sister.  History obtained from both of them.  She is seeing psychiatry now.  On trazodone.  Sleeping better.  Feels better overall.  Today is anxious.  Still has days where is more anxious.  She did go on a trip with her son.  Enjoyed this.  Stays active.  No cardiac symptoms with increased activity or exertion.  Breathing stable.  Bowels stable. Still with lower abdominal discomfort.  Has been worked up extensively.  Sees GI.    Past Medical History  Diagnosis Date  . Hyperlipidemia   . Hypertension   . CAD (coronary artery disease)   . MI (myocardial infarction)     Non-ST elevation MI  . GI bleed   . Renal cell carcinoma   . Anxiety   . Chronic kidney disease (CKD), stage III (moderate)   . Depression   . GERD (gastroesophageal reflux disease)   . Chronic systolic congestive heart failure     EF of 35 %   . Arthritis   . AAA (abdominal aortic aneurysm)     s/p stent   . Kidney stones   . Diverticulitis     H/O  . Hx of migraines   . Thyroid disease   . Hx: UTI (urinary tract infection)     Review of Systems  Constitutional: Negative for appetite change and unexpected weight change.  HENT: Negative for congestion and sinus pressure.   Respiratory: Negative for cough, chest tightness and shortness of breath.   Cardiovascular: Negative for chest pain, palpitations and leg swelling.  Gastrointestinal: Positive for abdominal pain (continued lower abdominal discomfort. ). Negative for nausea, vomiting and diarrhea.  Genitourinary: Negative for dysuria and difficulty urinating.  Skin: Negative for color change and rash.  Neurological: Negative for dizziness, light-headedness and headaches.  Psychiatric/Behavioral:   Some increased anxiety today.  Is sleeping better.         Objective:    Physical Exam  Constitutional: She appears well-developed and well-nourished. No distress.  HENT:  Nose: Nose normal.  Mouth/Throat: Oropharynx is clear and moist.  Neck: Neck supple. No thyromegaly present.  Cardiovascular: Normal rate and regular rhythm.   Pulmonary/Chest: Breath sounds normal. No respiratory distress. She has no wheezes.  Abdominal: Soft. Bowel sounds are normal. There is no tenderness.  Musculoskeletal: She exhibits no edema or tenderness.  Lymphadenopathy:    She has no cervical adenopathy.  Skin: No rash noted. No erythema.    BP 132/76 mmHg  Pulse 86  Temp(Src) 98.1 F (36.7 C) (Oral)  Resp 14  Ht 5\' 3"  (1.6 m)  Wt 140 lb 12.8 oz (63.866 kg)  BMI 24.95 kg/m2  SpO2 96% Wt Readings from Last 3 Encounters:  03/14/15 140 lb 12.8 oz (63.866 kg)  03/03/15 142 lb 3.2 oz (64.501 kg)  11/12/14 147 lb 2 oz (66.735 kg)     Lab Results  Component Value Date   WBC 3.2* 11/26/2014   HGB 15.6* 11/26/2014   HCT 45.8 11/26/2014   PLT 223.0 11/26/2014   GLUCOSE 139* 12/13/2014   CHOL 276* 11/26/2014   TRIG 145.0 11/26/2014   HDL 57.40 11/26/2014   LDLCALC 190* 11/26/2014   ALT  13 11/26/2014   AST 13 11/26/2014   NA 134* 12/13/2014   K 4.1 12/13/2014   CL 103 12/13/2014   CREATININE 1.11 12/13/2014   BUN 12 12/13/2014   CO2 27 12/13/2014   TSH 2.65 11/26/2014   INR 1.0 03/27/2012       Assessment & Plan:   Problem List Items Addressed This Visit    AAA (abdominal aortic aneurysm) - Primary    S/p repair.  Sees Dr Lucky Cowboy.       Abdominal pain    Has had extensive w/up.  CT and UGI as outlined in previous note.  Sees Dr Gustavo Lah.  Overall she feels is stable.        Anxiety    Followed by psychiatry.       CKD (chronic kidney disease), stage III    Follow metabolic panel.       Coronary atherosclerosis    Has seen Dr Rockey Situ.  Continue risk factor modification.         Depression    Seeing psychiatry.       Essential hypertension, benign    Blood pressure has been doing well.  Follow pressures.  Follow metabolic panel.        Relevant Orders   Basic metabolic panel   Health care maintenance    Mammogram 08/03/14 - Birads I.  Physical 07/06/14.  Colonoscopy 12/25/10.        Hypercholesterolemia    Low cholesterol diet and exercise.  Follow lipid panel and liver function tests.  On lipitor.        Relevant Orders   Hepatic function panel   Lipid panel   Hyperparathyroidism    She is s/p parathyroidectomy.  Intact PTH and ionized calcium previously elevated.  Have discussed this with her and her sister.  She has declined any further evaluation.  Follow calcium.        Memory change    Has seen Dr Manuella Ghazi.  Had MRI.  Continue f/u with Dr Manuella Ghazi.       Sleeping difficulty    Seeing psychiatry.  On trazodone.  Sleeping better.          I spent 25 minutes with the patient and more than 50% of the time was spent in consultation regarding the above.     [name], MD

## 2015-03-14 NOTE — Progress Notes (Signed)
Pre visit review using our clinic review tool, if applicable. No additional management support is needed unless otherwise documented below in the visit note. 

## 2015-03-21 ENCOUNTER — Other Ambulatory Visit: Payer: Self-pay | Admitting: Psychiatry

## 2015-03-21 ENCOUNTER — Encounter: Payer: Self-pay | Admitting: Internal Medicine

## 2015-03-21 NOTE — Assessment & Plan Note (Signed)
Seeing psychiatry.  On trazodone.  Sleeping better.

## 2015-03-21 NOTE — Assessment & Plan Note (Signed)
S/p repair.  Sees Dr Lucky Cowboy.

## 2015-03-21 NOTE — Assessment & Plan Note (Signed)
Has seen Dr Rockey Situ.  Continue risk factor modification.

## 2015-03-21 NOTE — Assessment & Plan Note (Signed)
Follow metabolic panel.  

## 2015-03-21 NOTE — Assessment & Plan Note (Signed)
She is s/p parathyroidectomy.  Intact PTH and ionized calcium previously elevated.  Have discussed this with her and her sister.  She has declined any further evaluation.  Follow calcium.

## 2015-03-21 NOTE — Assessment & Plan Note (Signed)
Low cholesterol diet and exercise.  Follow lipid panel and liver function tests.  On lipitor.   

## 2015-03-21 NOTE — Assessment & Plan Note (Signed)
Has had extensive w/up.  CT and UGI as outlined in previous note.  Sees Dr Gustavo Lah.  Overall she feels is stable.

## 2015-03-21 NOTE — Assessment & Plan Note (Signed)
Blood pressure has been doing well.  Follow pressures.  Follow metabolic panel.   

## 2015-03-21 NOTE — Assessment & Plan Note (Signed)
Seeing psychiatry

## 2015-03-21 NOTE — Assessment & Plan Note (Signed)
Mammogram 08/03/14 - Birads I.  Physical 07/06/14.  Colonoscopy 12/25/10.

## 2015-03-21 NOTE — Assessment & Plan Note (Signed)
Has seen Dr Manuella Ghazi.  Had MRI.  Continue f/u with Dr Manuella Ghazi.

## 2015-03-21 NOTE — Assessment & Plan Note (Signed)
Followed by psychiatry 

## 2015-03-31 ENCOUNTER — Encounter: Payer: Self-pay | Admitting: *Deleted

## 2015-03-31 ENCOUNTER — Other Ambulatory Visit (INDEPENDENT_AMBULATORY_CARE_PROVIDER_SITE_OTHER): Payer: Commercial Managed Care - HMO

## 2015-03-31 DIAGNOSIS — E7800 Pure hypercholesterolemia, unspecified: Secondary | ICD-10-CM

## 2015-03-31 DIAGNOSIS — I10 Essential (primary) hypertension: Secondary | ICD-10-CM

## 2015-03-31 DIAGNOSIS — N289 Disorder of kidney and ureter, unspecified: Secondary | ICD-10-CM | POA: Diagnosis not present

## 2015-03-31 LAB — LIPID PANEL
CHOLESTEROL: 180 mg/dL (ref 0–200)
HDL: 55.2 mg/dL (ref >39.00)
LDL Cholesterol: 104 mg/dL — ABNORMAL HIGH (ref 0–99)
NonHDL: 124.8
Total CHOL/HDL Ratio: 3
Triglycerides: 106 mg/dL (ref 0.0–149.0)
VLDL: 21.2 mg/dL (ref 0.0–40.0)

## 2015-03-31 LAB — HEPATIC FUNCTION PANEL
ALK PHOS: 78 U/L (ref 39–117)
ALT: 11 U/L (ref 0–35)
AST: 14 U/L (ref 0–37)
Albumin: 4 g/dL (ref 3.5–5.2)
BILIRUBIN DIRECT: 0.1 mg/dL (ref 0.0–0.3)
TOTAL PROTEIN: 6.6 g/dL (ref 6.0–8.3)
Total Bilirubin: 0.6 mg/dL (ref 0.2–1.2)

## 2015-03-31 LAB — BASIC METABOLIC PANEL
BUN: 14 mg/dL (ref 6–23)
CALCIUM: 10.1 mg/dL (ref 8.4–10.5)
CO2: 29 mEq/L (ref 19–32)
CREATININE: 1.12 mg/dL (ref 0.40–1.20)
Chloride: 105 mEq/L (ref 96–112)
GFR: 49.89 mL/min — ABNORMAL LOW (ref >60.00)
Glucose, Bld: 94 mg/dL (ref 70–99)
POTASSIUM: 4.4 meq/L (ref 3.5–5.1)
Sodium: 139 mEq/L (ref 135–145)

## 2015-04-01 ENCOUNTER — Other Ambulatory Visit: Payer: Self-pay | Admitting: Internal Medicine

## 2015-06-23 ENCOUNTER — Other Ambulatory Visit: Payer: Self-pay | Admitting: Internal Medicine

## 2015-06-24 ENCOUNTER — Other Ambulatory Visit: Payer: Self-pay | Admitting: Psychiatry

## 2015-06-27 ENCOUNTER — Other Ambulatory Visit: Payer: Self-pay

## 2015-06-27 MED ORDER — LORAZEPAM 0.5 MG PO TABS: 0.5000 mg | ORAL_TABLET | Freq: Every day | ORAL | Status: DC

## 2015-06-27 NOTE — Telephone Encounter (Signed)
pt states that she only has one tablet left of ativan .5mg  pt needs refill. pt did make another appt for dr. Gretel Acre for  07-14-15. pt last seen on  03-03-15

## 2015-06-27 NOTE — Telephone Encounter (Signed)
faxed rx - received a confirmation

## 2015-06-27 NOTE — Telephone Encounter (Signed)
Patient contacted the clinic as I was covering for Dr. Leda Gauze. Patient was last seen in June and was prescribed Ativan 0.5 mg daily as needed. Writer will refill enough medication to get her to her next appointment which is scheduled with Dr. Gretel Acre on 07/14/2015.

## 2015-06-27 NOTE — Telephone Encounter (Signed)
called spoke with pt. rx was faxed and received a confirmation.  Pt was given #[phone] until dr. Gretel Acre comes back

## 2015-07-07 MED ORDER — LORAZEPAM 0.5 MG PO TABS: 0.5000 mg | ORAL_TABLET | Freq: Every day | ORAL | Status: DC

## 2015-07-14 ENCOUNTER — Ambulatory Visit (INDEPENDENT_AMBULATORY_CARE_PROVIDER_SITE_OTHER): Payer: Commercial Managed Care - HMO | Admitting: Internal Medicine

## 2015-07-14 ENCOUNTER — Encounter: Payer: Self-pay | Admitting: Internal Medicine

## 2015-07-14 ENCOUNTER — Ambulatory Visit (INDEPENDENT_AMBULATORY_CARE_PROVIDER_SITE_OTHER): Payer: Commercial Managed Care - HMO | Admitting: Psychiatry

## 2015-07-14 ENCOUNTER — Encounter: Payer: Self-pay | Admitting: Psychiatry

## 2015-07-14 VITALS — BP 120/78 | HR 96 | Temp 97.1°F | Ht 62.0 in | Wt 141.0 lb

## 2015-07-14 VITALS — BP 120/80 | HR 81 | Temp 98.1°F | Resp 18 | Ht 62.0 in | Wt 140.2 lb

## 2015-07-14 DIAGNOSIS — E213 Hyperparathyroidism, unspecified: Secondary | ICD-10-CM

## 2015-07-14 DIAGNOSIS — N183 Chronic kidney disease, stage 3 unspecified: Secondary | ICD-10-CM

## 2015-07-14 DIAGNOSIS — I10 Essential (primary) hypertension: Secondary | ICD-10-CM | POA: Diagnosis not present

## 2015-07-14 DIAGNOSIS — F331 Major depressive disorder, recurrent, moderate: Secondary | ICD-10-CM | POA: Diagnosis not present

## 2015-07-14 DIAGNOSIS — Z23 Encounter for immunization: Secondary | ICD-10-CM

## 2015-07-14 DIAGNOSIS — I2510 Atherosclerotic heart disease of native coronary artery without angina pectoris: Secondary | ICD-10-CM

## 2015-07-14 DIAGNOSIS — R413 Other amnesia: Secondary | ICD-10-CM

## 2015-07-14 DIAGNOSIS — Z0000 Encounter for general adult medical examination without abnormal findings: Secondary | ICD-10-CM | POA: Diagnosis not present

## 2015-07-14 DIAGNOSIS — D509 Iron deficiency anemia, unspecified: Secondary | ICD-10-CM

## 2015-07-14 DIAGNOSIS — E7800 Pure hypercholesterolemia, unspecified: Secondary | ICD-10-CM

## 2015-07-14 DIAGNOSIS — Z1239 Encounter for other screening for malignant neoplasm of breast: Secondary | ICD-10-CM

## 2015-07-14 DIAGNOSIS — F329 Major depressive disorder, single episode, unspecified: Secondary | ICD-10-CM

## 2015-07-14 DIAGNOSIS — F419 Anxiety disorder, unspecified: Secondary | ICD-10-CM

## 2015-07-14 DIAGNOSIS — F32A Depression, unspecified: Secondary | ICD-10-CM

## 2015-07-14 MED ORDER — MEMANTINE HCL 5 MG PO TABS: 5.0000 mg | ORAL_TABLET | Freq: Every day | ORAL | Status: DC

## 2015-07-14 MED ORDER — TRAZODONE HCL 50 MG PO TABS: 50.0000 mg | ORAL_TABLET | Freq: Every day | ORAL | Status: DC

## 2015-07-14 MED ORDER — LORAZEPAM 0.5 MG PO TABS: 0.5000 mg | ORAL_TABLET | Freq: Every day | ORAL | Status: DC

## 2015-07-14 MED ORDER — CITALOPRAM HYDROBROMIDE 40 MG PO TABS: 40.0000 mg | ORAL_TABLET | Freq: Every day | ORAL | Status: DC

## 2015-07-14 NOTE — Progress Notes (Signed)
Pre-visit discussion using our clinic review tool. No additional management support is needed unless otherwise documented below in the visit note.  

## 2015-07-14 NOTE — Progress Notes (Signed)
BH MD/PA/NP OP Progress Note  07/14/2015 9:42 AM ADDIE ALONGE  MRN:  [id number]  Subjective:    Pt is a 81 yo female who presented for follow up.   Patient reported that she continues to have problems with her memory and she has to write down everything. She brought 2 bottles of medications with her and she wanted to have refills at this time. She reported that she has been taking them religiously. She reported that she forgets to call her friends and then she has to call them again. She reported that she also walks on a daily basis. She reported that she is trying to be steady on her feet. She currently denied having any major issues at this time. She currently denied using drugs or alcohol. She appeared calm and during the interview and receptive to starting the medication to help with her memory. She stated that she does not get lost in the neighborhood. She is also interested in decreasing the dose of lorazepam to half a pill in the morning. Patient currently denied having any suicidal homicidal ideations or plans.    Chief Complaint:  Chief Complaint    Follow-up; Medication Refill     Visit Diagnosis:     ICD-9-CM ICD-10-CM   1. MDD (major depressive disorder), recurrent episode, moderate ([place]) 296.32 F33.1     Past Medical History:  Past Medical History  Diagnosis Date  . Hyperlipidemia   . Hypertension   . CAD (coronary artery disease)   . MI (myocardial infarction) ([place])     Non-ST elevation MI  . GI bleed   . Renal cell carcinoma   . Anxiety   . Chronic kidney disease (CKD), stage III (moderate)   . Depression   . GERD (gastroesophageal reflux disease)   . Chronic systolic congestive heart failure (HCC)     EF of 35 %   . Arthritis   . AAA (abdominal aortic aneurysm) ([place])     s/p stent   . Kidney stones   . Diverticulitis     H/O  . Hx of migraines   . Thyroid disease   . Hx: UTI (urinary tract infection)     Past Surgical History  Procedure  Laterality Date  . Cardiac catheterization    . Cardiac catheterization  09/18/2010  . Coronary stent placement  09/18/2010    CAD; MI s/p stent  . Nephrectomy  10/2008    Right  . Shoulder surgery  07/2010    Right  . Abdominal aortic aneurysm repair      s/p stent  . Cholecystectomy    . Thyroidectomy    . Laparoscopic partial nephrectomy  2010  . Parathyroidectomy    . Abdominal aortic endovascular stent graft     Family History:  Family History  Problem Relation Age of Onset  . Arthritis Mother   . Heart disease Mother   . Pulmonary fibrosis Mother   . Heart disease Father   . Hodgkin's lymphoma Father   . Breast cancer Sister   . Hyperlipidemia Sister    Social History:  Social History   Social History  . Marital Status: Divorced    Spouse Name: N/A  . Number of Children: N/A  . Years of Education: N/A   Occupational History  . Retired    Social History Main Topics  . Smoking status: Former Smoker -- 1.00 packs/day for 30 years    Quit date: 10/01/2005  . Smokeless tobacco: Never Used  .  Alcohol Use: 4.2 oz/week    4 Glasses of wine, 3 Standard drinks or equivalent, 0 Cans of beer, 0 Shots of liquor per week     Comment: Wine 2-3 times weekly  . Drug Use: No  . Sexual Activity: No   Other Topics Concern  . None   Social History Narrative   Married   Psychologist, counselling occasionally with house work   Additional History: Lives alone .  Assessment:   Musculoskeletal: Strength & Muscle Tone: within normal limits Gait & Station: normal Patient leans: N/A  Psychiatric Specialty Exam: HPI   Review of Systems  Constitutional: Negative for fever and weight loss.  HENT: Negative for hearing loss and nosebleeds.   Eyes: Negative for photophobia.  Respiratory: Negative for sputum production.   Cardiovascular: Positive for orthopnea.  Gastrointestinal: Negative for vomiting and diarrhea.  Genitourinary: Negative for hematuria.  Neurological: Positive for sensory  change. Negative for tingling.  Endo/Heme/Allergies: Negative for environmental allergies.  Psychiatric/Behavioral: Positive for depression. Negative for suicidal ideas and hallucinations. The patient is nervous/anxious.     Blood pressure 120/78, pulse 96, temperature 97.1 F (36.2 C), temperature source Tympanic, height 5\' 2"  (1.575 m), weight 141 lb (63.957 kg), SpO2 94 %.Body mass index is 25.78 kg/(m^2).  General Appearance: Fairly Groomed  Eye Contact:  good  Speech:  Normal Rate  Volume:  Normal   Mood:  Anxious  Affect:  Congruent  Thought Process:  Logical  Orientation:  Full (Time, Place, and Person)  Thought Content:  WDL  Suicidal Thoughts:  No  Homicidal Thoughts:  No  Memory:  Immediate;   Fair Recent;   Fair  Judgement:  Fair  Insight:  Fair  Psychomotor Activity:  Normal  Concentration:  Fair  Recall:  [employer] of Knowledge: Fair  Language: Fair  Akathisia:  No  Handed:  Right  AIMS (if indicated):  none  Assets:  Communication Skills Desire for Improvement Physical Health  ADL's:  Intact  Cognition: WNL  Sleep:  6-7    Is the patient at risk to self?  No. Has the patient been a risk to self in the past 6 months?  No. Has the patient been a risk to self within the distant past?  No. Is the patient a risk to others?  No. Has the patient been a risk to others in the past 6 months?  No. Has the patient been a risk to others within the distant past?  No.  Current Medications: Current Outpatient Prescriptions  Medication Sig Dispense Refill  . acetaminophen (TYLENOL) 500 MG tablet Take 325 mg by mouth as needed.     Marland Kitchen aspirin EC 81 MG tablet Take 81 mg by mouth daily.    Marland Kitchen atorvastatin (LIPITOR) 80 MG tablet Take 80 mg by mouth daily.    . bisacodyl (DULCOLAX) 5 MG EC tablet Take 5 mg by mouth as needed.    . calcium carbonate (CALCIUM 600) 600 MG TABS Take 600 mg by mouth daily.      . carvedilol (COREG) 3.125 MG tablet Take 3.125 mg by mouth 2 (two)  times daily.    . cetirizine (ZYRTEC) 10 MG tablet Take 10 mg by mouth daily.    . Cholecalciferol (VITAMIN D) 400 UNITS capsule Take 400 Units by mouth daily.    . citalopram (CELEXA) 20 MG tablet Take 2 tablets (40 mg total) by mouth daily. 60 tablet 1  . Cyanocobalamin 1000 MCG TBCR Take 1 tablet by mouth  daily.    . dicyclomine (BENTYL) 10 MG capsule Take 10 mg by mouth 3 (three) times daily.    . diphenhydrAMINE (BENADRYL) 25 MG tablet Take 25 mg by mouth daily as needed.      . docusate sodium (COLACE) 100 MG capsule Take 100 mg by mouth daily as needed.    . Glucosamine-Chondroit-Vit C-Mn (GLUCOSAMINE 1500 COMPLEX) CAPS Take by mouth.      . hyoscyamine (LEVBID) 0.375 MG 12 hr tablet Take 0.375 mg by mouth every 12 (twelve) hours as needed for cramping.    Marland Kitchen ibuprofen (ADVIL,MOTRIN) 600 MG tablet Take 600 mg by mouth as needed.    Marland Kitchen lisinopril (PRINIVIL,ZESTRIL) 5 MG tablet TAKE 1 TABLET BY MOUTH DAILY 90 tablet 4  . loperamide (ANTI-DIARRHEAL) 2 MG tablet Take 2 mg by mouth as needed.    Marland Kitchen LORazepam (ATIVAN) 0.5 MG tablet Take 1 tablet (0.5 mg total) by mouth daily after breakfast. 30 tablet 0  . Multiple Vitamin (MULTIVITAMIN) tablet Take 1 tablet by mouth daily.      . nitroGLYCERIN (NITROSTAT) 0.4 MG SL tablet Place 1 tablet (0.4 mg total) under the tongue every 5 (five) minutes as needed. 30 tablet 1  . Omega-3 Fatty Acids (FISH OIL) 1200 MG CAPS Take 1,200 mg by mouth daily.    . phenylephrine (SUDAFED PE) 10 MG TABS tablet Take 10 mg by mouth as needed.    . simvastatin (ZOCOR) 40 MG tablet TAKE ONE TABLET BY MOUTH EVERY NIGHT AT BEDTIME 90 tablet 1  . traZODone (DESYREL) 50 MG tablet Take 1 tablet (50 mg total) by mouth at bedtime. 30 tablet 1  . triamterene-hydrochlorothiazide (DYAZIDE) 37.5-25 MG per capsule Take 1 capsule by mouth daily.    Marland Kitchen venlafaxine (EFFEXOR) 75 MG tablet Take 1 tablet (75 mg total) by mouth 2 (two) times daily. 180 tablet 0  . vitamin E 400 UNIT capsule  Take 400 Units by mouth daily.    Marland Kitchen zolpidem (AMBIEN) 10 MG tablet Take 10 mg by mouth as needed.     No current facility-administered medications for this visit.    Medical Decision Making:  Established Problem, Stable/Improving (1)  Treatment Plan Summary:Medication management   Memory impairment I will start her on Namenda 5 mg by mouth daily and discussed with her about the adverse effects in detail and she demonstrated understanding. I will decrease the dose of lorazepam to 0.25 mg at this time and she agreed with the plan.  Depression She will continue on Celexa 40 mg by mouth daily  Insomnia Patient is currently on trazodone 50 mg by mouth daily at bedtime  Follow-up Advised patient to follow up in 1 month or earlier depending on her symptoms    More than 50% of the time spent in psychoeducation, counseling and coordination of care.  Time spent with the patient 25 minutes   This note was generated in part or whole with voice recognition software. Voice regonition is usually quite accurate but there are transcription errors that can and very often do occur. I apologize for any typographical errors that were not detected and corrected.      Rainey Pines 07/14/2015, 9:42 AM

## 2015-07-14 NOTE — Patient Instructions (Signed)

## 2015-07-14 NOTE — Progress Notes (Signed)
Patient ID: Karen Collins, female   DOB: August 04, 1934, 81 y.o.   MRN: [id number]   Subjective:    Patient ID: Karen Collins, female    DOB: May 21, 1934, 81 y.o.   MRN: [id number]  HPI  Patient with past history of hypertension, carotid stenosis, CKD, memory difficulty and hypercholesterolemia.  She comes in today to follow up on these issues as well as for a complete physical exam.  She just saw psychiatry this am.  They started her on namenda.  She is walking.  She feels better.  No cardiac symptoms with increased activity or exertion.  No sob.  Eating and drinking well.  States the previous abdominal discomfort is not an issue for her now.  Bowels stable.  No nausea or vomiting.  States contemplating visiting her children.     Past Medical History  Diagnosis Date  . Hyperlipidemia   . Hypertension   . CAD (coronary artery disease)   . MI (myocardial infarction) ([place])     Non-ST elevation MI  . GI bleed   . Renal cell carcinoma   . Anxiety   . Chronic kidney disease (CKD), stage III (moderate)   . Depression   . GERD (gastroesophageal reflux disease)   . Chronic systolic congestive heart failure (HCC)     EF of 35 %   . Arthritis   . AAA (abdominal aortic aneurysm) ([place])     s/p stent   . Kidney stones   . Diverticulitis     H/O  . Hx of migraines   . Thyroid disease   . Hx: UTI (urinary tract infection)    Past Surgical History  Procedure Laterality Date  . Cardiac catheterization    . Cardiac catheterization  09/18/2010  . Coronary stent placement  09/18/2010    CAD; MI s/p stent  . Nephrectomy  10/2008    Right  . Shoulder surgery  07/2010    Right  . Abdominal aortic aneurysm repair      s/p stent  . Cholecystectomy    . Thyroidectomy    . Laparoscopic partial nephrectomy  2010  . Parathyroidectomy    . Abdominal aortic endovascular stent graft     Family History  Problem Relation Age of Onset  . Arthritis Mother   . Heart disease Mother   .  Pulmonary fibrosis Mother   . Heart disease Father   . Hodgkin's lymphoma Father   . Breast cancer Sister   . Hyperlipidemia Sister    Social History   Social History  . Marital Status: Divorced    Spouse Name: N/A  . Number of Children: N/A  . Years of Education: N/A   Occupational History  . Retired    Social History Main Topics  . Smoking status: Former Smoker -- 1.00 packs/day for 30 years    Quit date: 10/01/2005  . Smokeless tobacco: Never Used  . Alcohol Use: 4.2 oz/week    4 Glasses of wine, 3 Standard drinks or equivalent, 0 Cans of beer, 0 Shots of liquor per week     Comment: Wine 2-3 times weekly  . Drug Use: No  . Sexual Activity: No   Other Topics Concern  . None   Social History Narrative   Married   Walks occasionally with house work    Outpatient Encounter Prescriptions as of 07/14/2015  Medication Sig  . acetaminophen (TYLENOL) 500 MG tablet Take 325 mg by mouth as needed.   Marland Kitchen aspirin EC 81  MG tablet Take 81 mg by mouth daily.  Marland Kitchen atorvastatin (LIPITOR) 80 MG tablet Take 80 mg by mouth daily.  . bisacodyl (DULCOLAX) 5 MG EC tablet Take 5 mg by mouth as needed.  . calcium carbonate (CALCIUM 600) 600 MG TABS Take 600 mg by mouth daily.    . carvedilol (COREG) 3.125 MG tablet Take 3.125 mg by mouth 2 (two) times daily.  . cetirizine (ZYRTEC) 10 MG tablet Take 10 mg by mouth daily.  . Cholecalciferol (VITAMIN D) 400 UNITS capsule Take 400 Units by mouth daily.  . citalopram (CELEXA) 40 MG tablet Take 1 tablet (40 mg total) by mouth daily.  . Cyanocobalamin 1000 MCG TBCR Take 1 tablet by mouth daily.  Marland Kitchen dicyclomine (BENTYL) 10 MG capsule Take 10 mg by mouth 3 (three) times daily.  . diphenhydrAMINE (BENADRYL) 25 MG tablet Take 25 mg by mouth daily as needed.    . docusate sodium (COLACE) 100 MG capsule Take 100 mg by mouth daily as needed.  . Glucosamine-Chondroit-Vit C-Mn (GLUCOSAMINE 1500 COMPLEX) CAPS Take by mouth.    . hyoscyamine (LEVBID) 0.375 MG  12 hr tablet Take 0.375 mg by mouth every 12 (twelve) hours as needed for cramping.  Marland Kitchen ibuprofen (ADVIL,MOTRIN) 600 MG tablet Take 600 mg by mouth as needed.  Marland Kitchen lisinopril (PRINIVIL,ZESTRIL) 5 MG tablet TAKE 1 TABLET BY MOUTH DAILY  . loperamide (ANTI-DIARRHEAL) 2 MG tablet Take 2 mg by mouth as needed.  Marland Kitchen LORazepam (ATIVAN) 0.5 MG tablet Take 1 tablet (0.5 mg total) by mouth daily after breakfast.  . memantine (NAMENDA) 5 MG tablet Take 1 tablet (5 mg total) by mouth daily.  . Multiple Vitamin (MULTIVITAMIN) tablet Take 1 tablet by mouth daily.    . nitroGLYCERIN (NITROSTAT) 0.4 MG SL tablet Place 1 tablet (0.4 mg total) under the tongue every 5 (five) minutes as needed.  . Omega-3 Fatty Acids (FISH OIL) 1200 MG CAPS Take 1,200 mg by mouth daily.  . phenylephrine (SUDAFED PE) 10 MG TABS tablet Take 10 mg by mouth as needed.  . simvastatin (ZOCOR) 40 MG tablet TAKE ONE TABLET BY MOUTH EVERY NIGHT AT BEDTIME  . traZODone (DESYREL) 50 MG tablet Take 1 tablet (50 mg total) by mouth at bedtime.  . triamterene-hydrochlorothiazide (DYAZIDE) 37.5-25 MG per capsule Take 1 capsule by mouth daily.  . vitamin E 400 UNIT capsule Take 400 Units by mouth daily.   No facility-administered encounter medications on file as of 07/14/2015.    Review of Systems  Constitutional: Negative for appetite change and unexpected weight change.  HENT: Negative for congestion and sinus pressure.   Eyes: Negative for pain and visual disturbance.  Respiratory: Negative for cough, chest tightness and shortness of breath.   Cardiovascular: Negative for chest pain, palpitations and leg swelling.  Gastrointestinal: Negative for nausea, vomiting, abdominal pain and diarrhea.  Genitourinary: Negative for dysuria and difficulty urinating.  Musculoskeletal: Negative for back pain and joint swelling.  Skin: Negative for color change and rash.  Neurological: Negative for dizziness, light-headedness and headaches.       Issues  with memory.  Writes down things.    Hematological: Negative for adenopathy. Does not bruise/bleed easily.  Psychiatric/Behavioral: Negative for dysphoric mood and agitation.       Objective:    Physical Exam  Constitutional: She is oriented to person, place, and time. She appears well-developed and well-nourished. No distress.  HENT:  Nose: Nose normal.  Mouth/Throat: Oropharynx is clear and moist.  Eyes: Right  eye exhibits no discharge. Left eye exhibits no discharge. No scleral icterus.  Neck: Neck supple. No thyromegaly present.  Cardiovascular: Normal rate and regular rhythm.   Pulmonary/Chest: Breath sounds normal. No accessory muscle usage. No tachypnea. No respiratory distress. She has no decreased breath sounds. She has no wheezes. She has no rhonchi. Right breast exhibits no inverted nipple, no mass, no nipple discharge and no tenderness (no axillary adenopathy). Left breast exhibits no inverted nipple, no mass, no nipple discharge and no tenderness (no axilarry adenopathy).  Abdominal: Soft. Bowel sounds are normal. There is no tenderness.  Musculoskeletal: She exhibits no edema or tenderness.  Lymphadenopathy:    She has no cervical adenopathy.  Neurological: She is alert and oriented to person, place, and time.  Skin: Skin is warm. No rash noted. No erythema.  Psychiatric: She has a normal mood and affect. Her behavior is normal.    BP 120/80 mmHg  Pulse 81  Temp(Src) 98.1 F (36.7 C) (Oral)  Resp 18  Ht 5\' 2"  (1.575 m)  Wt 140 lb 4 oz (63.617 kg)  BMI 25.65 kg/m2  SpO2 97% Wt Readings from Last 3 Encounters:  07/14/15 140 lb 4 oz (63.617 kg)  07/14/15 141 lb (63.957 kg)  03/14/15 140 lb 12.8 oz (63.866 kg)     Lab Results  Component Value Date   WBC 3.2* 11/26/2014   HGB 15.6* 11/26/2014   HCT 45.8 11/26/2014   PLT 223.0 11/26/2014   GLUCOSE 94 03/31/2015   CHOL 180 03/31/2015   TRIG 106.0 03/31/2015   HDL 55.20 03/31/2015   LDLCALC 104* 03/31/2015    ALT 11 03/31/2015   AST 14 03/31/2015   NA 139 03/31/2015   K 4.4 03/31/2015   CL 105 03/31/2015   CREATININE 1.12 03/31/2015   BUN 14 03/31/2015   CO2 29 03/31/2015   TSH 2.65 11/26/2014   INR 1.0 03/27/2012   HGBA1C 5.6 03/27/2012       Assessment & Plan:   Problem List Items Addressed This Visit    Anemia, iron deficiency    Follow cbc.       Relevant Orders   CBC with Differential/Platelet   Anxiety    Appears to be doing better.  Followed by psychiatry.        CKD (chronic kidney disease), stage III    Follow metabolic panel.       Coronary atherosclerosis    Has seen Dr Rockey Situ.  Continue risk factor modification.  Currently asymptomatic.        Depression    Followed by psychiatry.        Essential hypertension, benign    Blood pressure under good control.  Continue same medication regimen.  Follow pressures.  Follow metabolic panel.        Relevant Orders   Basic metabolic panel   Health care maintenance    Physical today 07/15/15.  Mammogram 08/03/14 - birads I.  Schedule f/u mammogram.  Colonoscopy 12/25/10.       Hypercholesterolemia    On lipitor.  Follow lipid panel and liver function tests.        Relevant Orders   Lipid panel   Hepatic function panel   Hyperparathyroidism [facility])    She is s/p parathyroidectomy.  Intact PTH and ionized calcium previously elevated.  Have discussed with her and her sister.  She has declined any further evaluation.  Recent calcium wnl.        Memory change  Has seen Dr Manuella Ghazi.  Had MRI.  Just started on namenda by neurology.         Other Visit Diagnoses    Screening breast examination    -  Primary    Relevant Orders    MM DIGITAL SCREENING BILATERAL        [name], MD

## 2015-07-16 ENCOUNTER — Encounter: Payer: Self-pay | Admitting: Internal Medicine

## 2015-07-16 NOTE — Assessment & Plan Note (Signed)
Has seen Dr Rockey Situ.  Continue risk factor modification.  Currently asymptomatic.

## 2015-07-16 NOTE — Assessment & Plan Note (Signed)
Appears to be doing better.  Followed by psychiatry.

## 2015-07-16 NOTE — Assessment & Plan Note (Signed)
Has seen Dr Manuella Ghazi.  Had MRI.  Just started on namenda by neurology.

## 2015-07-16 NOTE — Assessment & Plan Note (Signed)
Followed by psychiatry 

## 2015-07-16 NOTE — Assessment & Plan Note (Signed)
Follow cbc.  

## 2015-07-16 NOTE — Assessment & Plan Note (Signed)
Physical today 07/15/15.  Mammogram 08/03/14 - birads I.  Schedule f/u mammogram.  Colonoscopy 12/25/10.

## 2015-07-16 NOTE — Assessment & Plan Note (Signed)
Follow metabolic panel.  

## 2015-07-16 NOTE — Assessment & Plan Note (Signed)
She is s/p parathyroidectomy.  Intact PTH and ionized calcium previously elevated.  Have discussed with her and her sister.  She has declined any further evaluation.  Recent calcium wnl.

## 2015-07-16 NOTE — Assessment & Plan Note (Signed)
Blood pressure under good control.  Continue same medication regimen.  Follow pressures.  Follow metabolic panel.   

## 2015-07-16 NOTE — Assessment & Plan Note (Signed)
On lipitor.  Follow lipid panel and liver function tests.   

## 2015-07-18 ENCOUNTER — Other Ambulatory Visit: Payer: Self-pay

## 2015-07-18 NOTE — Telephone Encounter (Signed)
this patient has been dismissed from out office.

## 2015-07-19 NOTE — Telephone Encounter (Signed)
Pt will be given 15 pills of Lorazepam

## 2015-07-20 NOTE — Telephone Encounter (Signed)
this pt has not been dismissed pt has a up coming appt. (disregard pervious note that pt has been dismissed)  pt need a 30 day supply

## 2015-08-05 ENCOUNTER — Ambulatory Visit: Payer: Commercial Managed Care - HMO

## 2015-08-10 ENCOUNTER — Other Ambulatory Visit: Payer: Self-pay | Admitting: Internal Medicine

## 2015-08-10 ENCOUNTER — Ambulatory Visit
Admission: RE | Admit: 2015-08-10 | Discharge: 2015-08-10 | Disposition: A | Discharge status: Home / Self Care | Payer: Commercial Managed Care - HMO | Source: Ambulatory Visit | Attending: Internal Medicine | Admitting: Internal Medicine

## 2015-08-10 DIAGNOSIS — Z1239 Encounter for other screening for malignant neoplasm of breast: Secondary | ICD-10-CM

## 2015-08-10 DIAGNOSIS — Z1231 Encounter for screening mammogram for malignant neoplasm of breast: Secondary | ICD-10-CM | POA: Insufficient documentation

## 2015-08-11 ENCOUNTER — Other Ambulatory Visit: Payer: Self-pay

## 2015-08-16 ENCOUNTER — Encounter: Payer: Self-pay | Admitting: Psychiatry

## 2015-08-16 ENCOUNTER — Ambulatory Visit (INDEPENDENT_AMBULATORY_CARE_PROVIDER_SITE_OTHER): Payer: Commercial Managed Care - HMO | Admitting: Psychiatry

## 2015-08-16 VITALS — BP 122/86 | HR 95 | Temp 98.3°F | Ht 62.0 in | Wt 140.4 lb

## 2015-08-16 DIAGNOSIS — F331 Major depressive disorder, recurrent, moderate: Secondary | ICD-10-CM

## 2015-08-16 DIAGNOSIS — F411 Generalized anxiety disorder: Secondary | ICD-10-CM

## 2015-08-16 MED ORDER — MEMANTINE HCL 10 MG PO TABS: 10.0000 mg | ORAL_TABLET | Freq: Every day | ORAL | Status: DC

## 2015-08-16 MED ORDER — BUSPIRONE HCL 5 MG PO TABS: 5.0000 mg | ORAL_TABLET | Freq: Two times a day (BID) | ORAL | Status: DC

## 2015-08-16 MED ORDER — CITALOPRAM HYDROBROMIDE 20 MG PO TABS: 20.0000 mg | ORAL_TABLET | Freq: Every day | ORAL | Status: DC

## 2015-08-16 MED ORDER — LORAZEPAM 0.5 MG PO TABS: 0.2500 mg | ORAL_TABLET | Freq: Every day | ORAL | Status: DC

## 2015-08-16 MED ORDER — TRAZODONE HCL 50 MG PO TABS: 50.0000 mg | ORAL_TABLET | Freq: Every day | ORAL | Status: DC

## 2015-08-16 NOTE — Progress Notes (Signed)
BH MD/PA/NP OP Progress Note  08/16/2015 9:51 AM Karen Collins  MRN:  [id number]  Subjective:    Pt is a 81 yo female who presented for follow up accompanied by her friend. She reported that she continues to have severe anxiety which is worsening her memory and she took  the lorazepam this morning before coming for this appointment. Patient stated that she has anxiety which is worse in the morning and she has to write down everything. She stated that she is running out of lorazepam. Her friend also noted that she has memory which is improving with the help of Namenda but she does not improve with her anxiety symptoms. Patient reported that she does not remember her medications and her daughter has the list of her medications. She currently denied having any adverse effects of the medications. She appeared anxious and was having anxiety symptoms. I try to do the Mini-Mental status exam on the patient but she was unable to participate due to worsening of her anxiety symptoms. She currently denied having any suicidal ideations or plans. She was receptive to have the medications changed with the help of her friend. We adjusted some of her medications.    Chief Complaint:  Chief Complaint    Follow-up; Medication Refill; Anxiety; Panic Attack     Visit Diagnosis:     ICD-9-CM ICD-10-CM   1. MDD (major depressive disorder), recurrent episode, moderate (HCC) 296.32 F33.1   2. Anxiety state 300.00 F41.1     Past Medical History:  Past Medical History  Diagnosis Date  . Hyperlipidemia   . Hypertension   . CAD (coronary artery disease)   . MI (myocardial infarction) ([place])     Non-ST elevation MI  . GI bleed   . Anxiety   . Chronic kidney disease (CKD), stage III (moderate)   . Depression   . GERD (gastroesophageal reflux disease)   . Chronic systolic congestive heart failure (HCC)     EF of 35 %   . Arthritis   . AAA (abdominal aortic aneurysm) ([place])     s/p stent   . Kidney stones    . Diverticulitis     H/O  . Hx of migraines   . Thyroid disease   . Hx: UTI (urinary tract infection)   . Renal cell carcinoma     Past Surgical History  Procedure Laterality Date  . Cardiac catheterization    . Cardiac catheterization  09/18/2010  . Coronary stent placement  09/18/2010    CAD; MI s/p stent  . Nephrectomy  10/2008    Right  . Shoulder surgery  07/2010    Right  . Abdominal aortic aneurysm repair      s/p stent  . Cholecystectomy    . Thyroidectomy    . Laparoscopic partial nephrectomy  2010  . Parathyroidectomy    . Abdominal aortic endovascular stent graft     Family History:  Family History  Problem Relation Age of Onset  . Arthritis Mother   . Heart disease Mother   . Pulmonary fibrosis Mother   . Heart disease Father   . Hodgkin's lymphoma Father   . Breast cancer Sister   . Hyperlipidemia Sister    Social History:  Social History   Social History  . Marital Status: Divorced    Spouse Name: N/A  . Number of Children: N/A  . Years of Education: N/A   Occupational History  . Retired    Social History Main Topics  .  Smoking status: Former Smoker -- 1.00 packs/day for 30 years    Quit date: 10/01/2005  . Smokeless tobacco: Never Used  . Alcohol Use: 4.2 oz/week    4 Glasses of wine, 3 Standard drinks or equivalent, 0 Cans of beer, 0 Shots of liquor per week     Comment: Wine 2-3 times weekly  . Drug Use: No  . Sexual Activity: No   Other Topics Concern  . None   Social History Narrative   Married   Psychologist, counselling occasionally with house work   Additional History: Lives alone .  Assessment:   Musculoskeletal: Strength & Muscle Tone: within normal limits Gait & Station: normal Patient leans: N/A  Psychiatric Specialty Exam: Anxiety Symptoms include nervous/anxious behavior. Patient reports no suicidal ideas.      Review of Systems  Constitutional: Negative for fever and weight loss.  HENT: Negative for hearing loss and  nosebleeds.   Eyes: Negative for photophobia.  Respiratory: Negative for sputum production.   Cardiovascular: Positive for orthopnea.  Gastrointestinal: Negative for vomiting and diarrhea.  Genitourinary: Negative for hematuria.  Neurological: Positive for sensory change. Negative for tingling.  Endo/Heme/Allergies: Negative for environmental allergies.  Psychiatric/Behavioral: Positive for depression. Negative for suicidal ideas and hallucinations. The patient is nervous/anxious.     Blood pressure 122/86, pulse 95, temperature 98.3 F (36.8 C), temperature source Tympanic, height 5\' 2"  (1.575 m), weight 140 lb 6.4 oz (63.685 kg), SpO2 97 %.Body mass index is 25.67 kg/(m^2).  General Appearance: Fairly Groomed  Eye Contact:  good  Speech:  Normal Rate  Volume:  Normal   Mood:  Anxious  Affect:  Congruent  Thought Process:  Logical  Orientation:  Full (Time, Place, and Person)  Thought Content:  WDL  Suicidal Thoughts:  No  Homicidal Thoughts:  No  Memory:  Immediate;   Fair Recent;   Fair  Judgement:  Fair  Insight:  Fair  Psychomotor Activity:  Normal  Concentration:  Fair  Recall:  [employer] of Knowledge: Fair  Language: Fair  Akathisia:  No  Handed:  Right  AIMS (if indicated):  none  Assets:  Communication Skills Desire for Improvement Physical Health  ADL's:  Intact  Cognition: WNL  Sleep:  6-7    Is the patient at risk to self?  No. Has the patient been a risk to self in the past 6 months?  No. Has the patient been a risk to self within the distant past?  No. Is the patient a risk to others?  No. Has the patient been a risk to others in the past 6 months?  No. Has the patient been a risk to others within the distant past?  No.  Current Medications: Current Outpatient Prescriptions  Medication Sig Dispense Refill  . acetaminophen (TYLENOL) 500 MG tablet Take 325 mg by mouth as needed.     Marland Kitchen aspirin EC 81 MG tablet Take 81 mg by mouth daily.    Marland Kitchen  atorvastatin (LIPITOR) 80 MG tablet Take 80 mg by mouth daily.    . bisacodyl (DULCOLAX) 5 MG EC tablet Take 5 mg by mouth as needed.    . calcium carbonate (CALCIUM 600) 600 MG TABS Take 600 mg by mouth daily.      . carvedilol (COREG) 3.125 MG tablet Take 3.125 mg by mouth 2 (two) times daily.    . cetirizine (ZYRTEC) 10 MG tablet Take 10 mg by mouth daily.    . Cholecalciferol (VITAMIN D) 400 UNITS capsule  Take 400 Units by mouth daily.    . citalopram (CELEXA) 40 MG tablet Take 1 tablet (40 mg total) by mouth daily. 30 tablet 3  . Cyanocobalamin 1000 MCG TBCR Take 1 tablet by mouth daily.    Marland Kitchen dicyclomine (BENTYL) 10 MG capsule Take 10 mg by mouth 3 (three) times daily.    . diphenhydrAMINE (BENADRYL) 25 MG tablet Take 25 mg by mouth daily as needed.      . docusate sodium (COLACE) 100 MG capsule Take 100 mg by mouth daily as needed.    . Glucosamine-Chondroit-Vit C-Mn (GLUCOSAMINE 1500 COMPLEX) CAPS Take by mouth.      . hyoscyamine (LEVBID) 0.375 MG 12 hr tablet Take 0.375 mg by mouth every 12 (twelve) hours as needed for cramping.    Marland Kitchen ibuprofen (ADVIL,MOTRIN) 600 MG tablet Take 600 mg by mouth as needed.    Marland Kitchen lisinopril (PRINIVIL,ZESTRIL) 5 MG tablet TAKE 1 TABLET BY MOUTH DAILY 90 tablet 4  . loperamide (ANTI-DIARRHEAL) 2 MG tablet Take 2 mg by mouth as needed.    Marland Kitchen LORazepam (ATIVAN) 0.5 MG tablet Take 1 tablet (0.5 mg total) by mouth daily after breakfast. 15 tablet 1  . memantine (NAMENDA) 5 MG tablet Take 1 tablet (5 mg total) by mouth daily. 30 tablet 1  . Multiple Vitamin (MULTIVITAMIN) tablet Take 1 tablet by mouth daily.      . nitroGLYCERIN (NITROSTAT) 0.4 MG SL tablet Place 1 tablet (0.4 mg total) under the tongue every 5 (five) minutes as needed. 30 tablet 1  . Omega-3 Fatty Acids (FISH OIL) 1200 MG CAPS Take 1,200 mg by mouth daily.    . phenylephrine (SUDAFED PE) 10 MG TABS tablet Take 10 mg by mouth as needed.    . simvastatin (ZOCOR) 40 MG tablet TAKE ONE TABLET BY MOUTH  EVERY NIGHT AT BEDTIME 90 tablet 1  . traZODone (DESYREL) 50 MG tablet Take 1 tablet (50 mg total) by mouth at bedtime. 30 tablet 3  . triamterene-hydrochlorothiazide (DYAZIDE) 37.5-25 MG per capsule Take 1 capsule by mouth daily.    . vitamin E 400 UNIT capsule Take 400 Units by mouth daily.     No current facility-administered medications for this visit.    Medical Decision Making:  Established Problem, Stable/Improving (1)  Treatment Plan Summary:Medication management   Memory impairment I will start her on Namenda 10 mg by mouth daily and discussed with her about the adverse effects in detail and she demonstrated understanding.  Anxiety I will decrease the dose of lorazepam to 0.25 mg at this time and she  will stop the medication in the next 2 weeks. I will start her on BuSpar on 5 mg by mouth twice a day for her anxiety symptoms.  Depression She will decrease on Celexa 20 mg by mouth daily and we will switch her to another medication at her next appointment.  Insomnia Patient is currently on trazodone 50 mg by mouth daily at bedtime  Also advised patient to start taking probiotics to help with her GI symptoms as she continues to have diarrhea related to her anxiety  Follow-up Advised patient to follow up in 1 month or earlier depending on her symptoms    More than 50% of the time spent in psychoeducation, counseling and coordination of care related to her anxiety and memory  Time spent with the patient 25 minutes   This note was generated in part or whole with voice recognition software. Voice regonition is usually quite accurate  but there are transcription errors that can and very often do occur. I apologize for any typographical errors that were not detected and corrected.      Rainey Pines 08/16/2015, 9:51 AM

## 2015-08-24 ENCOUNTER — Other Ambulatory Visit: Payer: Self-pay

## 2015-09-01 NOTE — Progress Notes (Signed)
rx was refill on  08-16-15

## 2015-09-15 ENCOUNTER — Ambulatory Visit: Payer: Commercial Managed Care - HMO | Admitting: Psychiatry

## 2015-11-09 ENCOUNTER — Encounter: Payer: Self-pay | Admitting: Internal Medicine

## 2015-11-09 ENCOUNTER — Ambulatory Visit (INDEPENDENT_AMBULATORY_CARE_PROVIDER_SITE_OTHER): Payer: PPO | Admitting: Internal Medicine

## 2015-11-09 VITALS — BP 120/80 | HR 87 | Temp 98.0°F | Ht 62.0 in | Wt 141.8 lb

## 2015-11-09 DIAGNOSIS — F32A Depression, unspecified: Secondary | ICD-10-CM

## 2015-11-09 DIAGNOSIS — R413 Other amnesia: Secondary | ICD-10-CM

## 2015-11-09 DIAGNOSIS — I2510 Atherosclerotic heart disease of native coronary artery without angina pectoris: Secondary | ICD-10-CM

## 2015-11-09 DIAGNOSIS — R1084 Generalized abdominal pain: Secondary | ICD-10-CM

## 2015-11-09 DIAGNOSIS — N183 Chronic kidney disease, stage 3 unspecified: Secondary | ICD-10-CM

## 2015-11-09 DIAGNOSIS — E7800 Pure hypercholesterolemia, unspecified: Secondary | ICD-10-CM

## 2015-11-09 DIAGNOSIS — I10 Essential (primary) hypertension: Secondary | ICD-10-CM | POA: Diagnosis not present

## 2015-11-09 DIAGNOSIS — I6529 Occlusion and stenosis of unspecified carotid artery: Secondary | ICD-10-CM | POA: Diagnosis not present

## 2015-11-09 DIAGNOSIS — G479 Sleep disorder, unspecified: Secondary | ICD-10-CM

## 2015-11-09 DIAGNOSIS — F329 Major depressive disorder, single episode, unspecified: Secondary | ICD-10-CM

## 2015-11-09 NOTE — Progress Notes (Signed)
Patient ID: Karen Collins, female   DOB: 1934/01/01, 82 y.o.   MRN: [id number]   Subjective:    Patient ID: Karen Collins, female    DOB: [birth year], 82 y.o.   MRN: [id number]  HPI  Patient with past history of hypercholesterolemia, CAD, anxiety, depression and hypertension.  She comes in today to follow up on these issues.  She is accompanied by her sister.  History obtained from both of them.  She is seeing psychiatry.  Doing better.  Walking with a friend.  They eat together,etc.  Feels better.  This has helped her.  No chest pain or tightness.  No sob.  No acid reflux.  Persistent lower abdominal discomfort.  Appears to be better.  Will flare at times.  Bowels stable.  Discussed move to family.  She is not planning this at this point.  Sisters are here.     Past Medical History  Diagnosis Date  . Hyperlipidemia   . Hypertension   . CAD (coronary artery disease)   . MI (myocardial infarction) ([place])     Non-ST elevation MI  . GI bleed   . Anxiety   . Chronic kidney disease (CKD), stage III (moderate)   . Depression   . GERD (gastroesophageal reflux disease)   . Chronic systolic congestive heart failure (HCC)     EF of 35 %   . Arthritis   . AAA (abdominal aortic aneurysm) ([place])     s/p stent   . Kidney stones   . Diverticulitis     H/O  . Hx of migraines   . Thyroid disease   . Hx: UTI (urinary tract infection)   . Renal cell carcinoma    Past Surgical History  Procedure Laterality Date  . Cardiac catheterization    . Cardiac catheterization  09/18/2010  . Coronary stent placement  09/18/2010    CAD; MI s/p stent  . Nephrectomy  10/2008    Right  . Shoulder surgery  07/2010    Right  . Abdominal aortic aneurysm repair      s/p stent  . Cholecystectomy    . Thyroidectomy    . Laparoscopic partial nephrectomy  2010  . Parathyroidectomy    . Abdominal aortic endovascular stent graft     Family History  Problem Relation Age of Onset  . Arthritis Mother     . Heart disease Mother   . Pulmonary fibrosis Mother   . Heart disease Father   . Hodgkin's lymphoma Father   . Breast cancer Sister   . Hyperlipidemia Sister    Social History   Social History  . Marital Status: Divorced    Spouse Name: N/A  . Number of Children: N/A  . Years of Education: N/A   Occupational History  . Retired    Social History Main Topics  . Smoking status: Former Smoker -- 1.00 packs/day for 30 years    Quit date: 10/01/2005  . Smokeless tobacco: Never Used  . Alcohol Use: 4.2 oz/week    4 Glasses of wine, 3 Standard drinks or equivalent, 0 Cans of beer, 0 Shots of liquor per week     Comment: Wine 2-3 times weekly  . Drug Use: No  . Sexual Activity: No   Other Topics Concern  . None   Social History Narrative   Married   Walks occasionally with house work     Review of Systems  Constitutional: Negative for appetite change and unexpected weight change.  HENT: Negative for congestion and sinus pressure.   Respiratory: Negative for cough, chest tightness and shortness of breath.   Cardiovascular: Negative for chest pain, palpitations and leg swelling.  Gastrointestinal: Positive for abdominal pain. Negative for nausea, vomiting and diarrhea.  Genitourinary: Negative for dysuria and difficulty urinating.  Musculoskeletal: Negative for back pain and joint swelling.  Skin: Negative for color change and rash.  Neurological: Negative for dizziness, light-headedness and headaches.  Psychiatric/Behavioral: Negative for dysphoric mood and agitation.       Objective:     Weight rechecked - 141 pounds.   Physical Exam  Constitutional: She appears well-developed and well-nourished. No distress.  HENT:  Nose: Nose normal.  Mouth/Throat: Oropharynx is clear and moist.  Eyes: Conjunctivae are normal. Right eye exhibits no discharge. Left eye exhibits no discharge.  Neck: Neck supple. No thyromegaly present.  Cardiovascular: Normal rate and regular  rhythm.   Pulmonary/Chest: Breath sounds normal. No respiratory distress. She has no wheezes.  Abdominal: Soft. Bowel sounds are normal. There is no tenderness.  Musculoskeletal: She exhibits no edema or tenderness.  Lymphadenopathy:    She has no cervical adenopathy.  Skin: No rash noted. No erythema.  Psychiatric: She has a normal mood and affect. Her behavior is normal.    BP 120/80 mmHg  Pulse 87  Temp(Src) 98 F (36.7 C) (Oral)  Ht 5\' 2"  (1.575 m)  Wt 141 lb 12 oz (64.297 kg)  BMI 25.92 kg/m2 Wt Readings from Last 3 Encounters:  11/09/15 141 lb 12 oz (64.297 kg)  08/16/15 140 lb 6.4 oz (63.685 kg)  07/14/15 140 lb 4 oz (63.617 kg)     Lab Results  Component Value Date   WBC 3.2* 11/26/2014   HGB 15.6* 11/26/2014   HCT 45.8 11/26/2014   PLT 223.0 11/26/2014   GLUCOSE 94 03/31/2015   CHOL 180 03/31/2015   TRIG 106.0 03/31/2015   HDL 55.20 03/31/2015   LDLCALC 104* 03/31/2015   ALT 11 03/31/2015   AST 14 03/31/2015   NA 139 03/31/2015   K 4.4 03/31/2015   CL 105 03/31/2015   CREATININE 1.12 03/31/2015   BUN 14 03/31/2015   CO2 29 03/31/2015   TSH 2.65 11/26/2014   INR 1.0 03/27/2012   HGBA1C 5.6 03/27/2012    Mm Screening Breast Tomo Bilateral  08/10/2015  CLINICAL DATA:  Screening. EXAM: DIGITAL SCREENING BILATERAL MAMMOGRAM WITH 3D TOMO WITH CAD COMPARISON:  Previous exam(s). ACR Breast Density Category c: The breast tissue is heterogeneously dense, which may obscure small masses. FINDINGS: There are no findings suspicious for malignancy. Images were processed with CAD. IMPRESSION: No mammographic evidence of malignancy. A result letter of this screening mammogram will be mailed directly to the patient. RECOMMENDATION: Screening mammogram in one year. (Code:SM-B-01Y) BI-RADS CATEGORY  1: Negative. Electronically Signed   By: Abelardo Diesel M.D.   On: 08/10/2015 13:56       Assessment & Plan:   Problem List Items Addressed This Visit    Abdominal pain    Has  had extensive w/up.  CT and UGI as oultined in previous note.  Sees Dr Gustavo Lah.  Stable.  Follow.        Carotid stenosis    With the history of carotis stenosis and with the audible bruit, check carotid ultrasound.  Continue daily aspirin.        Relevant Orders   US Carotid Duplex Bilateral   CKD (chronic kidney disease), stage III    Follow metabolic  panel.        Coronary atherosclerosis    Has seen Dr Rockey Situ.  Continue risk factor modification.        Depression    Followed by psychiatry.  Doing better.        Essential hypertension, benign - Primary    Blood pressure under good control.  Continue same medication regimen.  Follow pressures.  Follow metabolic panel.        Relevant Orders   TSH   Hypercholesterolemia    Low cholesterol diet and exercise.  Follow  Lipid panel and liver function tests.  On zocor.        Memory change    S/p MRI.  Has seen Dr Manuella Ghazi.  Continue namenda.        Sleeping difficulty    Seeing psychiatry.  On trazodone.  Follow.  Doing better.           [name], MD

## 2015-11-09 NOTE — Progress Notes (Signed)
Pre visit review using our clinic review tool, if applicable. No additional management support is needed unless otherwise documented below in the visit note. 

## 2015-11-14 ENCOUNTER — Encounter: Payer: Self-pay | Admitting: Internal Medicine

## 2015-11-14 NOTE — Assessment & Plan Note (Signed)
Has seen Dr Gollan.  Continue risk factor modification.  

## 2015-11-14 NOTE — Assessment & Plan Note (Signed)
Has had extensive w/up.  CT and UGI as oultined in previous note.  Sees Dr Gustavo Lah.  Stable.  Follow.

## 2015-11-14 NOTE — Assessment & Plan Note (Signed)
Low cholesterol diet and exercise.  Follow  Lipid panel and liver function tests.  On zocor.

## 2015-11-14 NOTE — Assessment & Plan Note (Signed)
S/p MRI.  Has seen Dr Manuella Ghazi.  Continue namenda.

## 2015-11-14 NOTE — Assessment & Plan Note (Signed)
Follow metabolic panel.  

## 2015-11-14 NOTE — Assessment & Plan Note (Signed)
With the history of carotis stenosis and with the audible bruit, check carotid ultrasound.  Continue daily aspirin.

## 2015-11-14 NOTE — Assessment & Plan Note (Signed)
Blood pressure under good control.  Continue same medication regimen.  Follow pressures.  Follow metabolic panel.   

## 2015-11-14 NOTE — Assessment & Plan Note (Signed)
Followed by psychiatry.  Doing better.   

## 2015-11-14 NOTE — Assessment & Plan Note (Signed)
Seeing psychiatry.  On trazodone.  Follow.  Doing better.

## 2015-11-24 ENCOUNTER — Other Ambulatory Visit: Payer: Self-pay | Admitting: Internal Medicine

## 2015-11-25 DIAGNOSIS — I10 Essential (primary) hypertension: Secondary | ICD-10-CM | POA: Diagnosis not present

## 2015-11-25 DIAGNOSIS — R109 Unspecified abdominal pain: Secondary | ICD-10-CM | POA: Diagnosis not present

## 2015-11-25 DIAGNOSIS — I6523 Occlusion and stenosis of bilateral carotid arteries: Secondary | ICD-10-CM | POA: Diagnosis not present

## 2015-11-25 DIAGNOSIS — I714 Abdominal aortic aneurysm, without rupture: Secondary | ICD-10-CM | POA: Diagnosis not present

## 2015-12-01 ENCOUNTER — Telehealth: Payer: Self-pay | Admitting: Internal Medicine

## 2015-12-01 NOTE — Telephone Encounter (Signed)
Pt called to sch her AWV. Any day and time works. Thank you! Call pt @ [phone].

## 2015-12-02 ENCOUNTER — Telehealth: Payer: Self-pay | Admitting: *Deleted

## 2015-12-02 NOTE — Telephone Encounter (Signed)
Patient stated that she had a nose bleed on 12/01/15 before bedtime,she stated that she has some concerns and would like to talk with someone about her nose bleed, to clarify if she is ok. Pt contact [phone]

## 2015-12-02 NOTE — Telephone Encounter (Signed)
I assume the bleeding has stopped and not reoccurred.  She does need to let us know if continues to have problems with bleeding.

## 2015-12-02 NOTE — Telephone Encounter (Signed)
The pt is currently taking a baby aspirin a day which could be why the pt's nose took longer to stop bleeding, I express to Karen Collins that we are going into allergy season it would be a good idea to take the Zyrtec as prescribed on her current med list, i also recommended that she keep her nostrils moisturized due to the nature of the weather. Advised the pt that if she has any other concerns to call the office back or if over the weekend she can call the office and speak to the nurse help line. Please advise, thanks

## 2015-12-02 NOTE — Telephone Encounter (Signed)
No. Noted and verbalized to pt. Pt verbalized understanding

## 2015-12-22 ENCOUNTER — Telehealth: Payer: Self-pay | Admitting: Internal Medicine

## 2015-12-22 ENCOUNTER — Ambulatory Visit: Payer: Self-pay

## 2015-12-22 NOTE — Telephone Encounter (Signed)
Pt called about not able to make appt due to having some anxiety. Pt states she does not have anyone to drive her. Pt appt is still on the schedule. Pt will call back to reschedule. Thank you!

## 2015-12-23 NOTE — Telephone Encounter (Signed)
Yes ma'am she was on my schedule yesterday and was cancelled.  Thank you for following up.

## 2015-12-23 NOTE — Telephone Encounter (Signed)
FYI.  This was sent to me.  I think she was on your schedule for wellness check.

## 2016-01-26 ENCOUNTER — Other Ambulatory Visit: Payer: Self-pay | Admitting: Psychiatry

## 2016-02-03 ENCOUNTER — Other Ambulatory Visit: Payer: Self-pay | Admitting: Psychiatry

## 2016-02-08 ENCOUNTER — Ambulatory Visit (INDEPENDENT_AMBULATORY_CARE_PROVIDER_SITE_OTHER): Payer: PPO | Admitting: Psychiatry

## 2016-02-08 ENCOUNTER — Ambulatory Visit (INDEPENDENT_AMBULATORY_CARE_PROVIDER_SITE_OTHER): Payer: PPO | Admitting: Internal Medicine

## 2016-02-08 ENCOUNTER — Encounter: Payer: Self-pay | Admitting: Psychiatry

## 2016-02-08 ENCOUNTER — Encounter: Payer: Self-pay | Admitting: Internal Medicine

## 2016-02-08 VITALS — BP 140/80 | HR 92 | Temp 98.0°F | Resp 18 | Ht 62.0 in | Wt 142.8 lb

## 2016-02-08 VITALS — BP 142/90 | HR 107 | Temp 97.8°F | Wt 145.2 lb

## 2016-02-08 DIAGNOSIS — F32A Depression, unspecified: Secondary | ICD-10-CM

## 2016-02-08 DIAGNOSIS — D509 Iron deficiency anemia, unspecified: Secondary | ICD-10-CM

## 2016-02-08 DIAGNOSIS — R413 Other amnesia: Secondary | ICD-10-CM

## 2016-02-08 DIAGNOSIS — F329 Major depressive disorder, single episode, unspecified: Secondary | ICD-10-CM

## 2016-02-08 DIAGNOSIS — I714 Abdominal aortic aneurysm, without rupture, unspecified: Secondary | ICD-10-CM

## 2016-02-08 DIAGNOSIS — F331 Major depressive disorder, recurrent, moderate: Secondary | ICD-10-CM | POA: Diagnosis not present

## 2016-02-08 DIAGNOSIS — R42 Dizziness and giddiness: Secondary | ICD-10-CM | POA: Diagnosis not present

## 2016-02-08 DIAGNOSIS — E213 Hyperparathyroidism, unspecified: Secondary | ICD-10-CM

## 2016-02-08 DIAGNOSIS — G479 Sleep disorder, unspecified: Secondary | ICD-10-CM

## 2016-02-08 DIAGNOSIS — F1024 Alcohol dependence with alcohol-induced mood disorder: Secondary | ICD-10-CM

## 2016-02-08 DIAGNOSIS — E7800 Pure hypercholesterolemia, unspecified: Secondary | ICD-10-CM | POA: Diagnosis not present

## 2016-02-08 DIAGNOSIS — N183 Chronic kidney disease, stage 3 unspecified: Secondary | ICD-10-CM

## 2016-02-08 DIAGNOSIS — D72819 Decreased white blood cell count, unspecified: Secondary | ICD-10-CM

## 2016-02-08 DIAGNOSIS — I10 Essential (primary) hypertension: Secondary | ICD-10-CM | POA: Diagnosis not present

## 2016-02-08 DIAGNOSIS — F419 Anxiety disorder, unspecified: Secondary | ICD-10-CM

## 2016-02-08 DIAGNOSIS — I2510 Atherosclerotic heart disease of native coronary artery without angina pectoris: Secondary | ICD-10-CM

## 2016-02-08 LAB — BASIC METABOLIC PANEL
BUN: 12 mg/dL (ref 6–23)
CHLORIDE: 105 meq/L (ref 96–112)
CO2: 22 mEq/L (ref 19–32)
Calcium: 9.9 mg/dL (ref 8.4–10.5)
Creatinine, Ser: 1.15 mg/dL (ref 0.40–1.20)
GFR: 48.29 mL/min — AB (ref >60.00)
Glucose, Bld: 83 mg/dL (ref 70–99)
POTASSIUM: 4 meq/L (ref 3.5–5.1)
Sodium: 136 mEq/L (ref 135–145)

## 2016-02-08 LAB — LIPID PANEL
CHOL/HDL RATIO: 3
CHOLESTEROL: 200 mg/dL (ref 0–200)
HDL: 65.3 mg/dL (ref >39.00)
LDL CALC: 115 mg/dL — AB (ref 0–99)
NonHDL: 134.21
TRIGLYCERIDES: 97 mg/dL (ref 0.0–149.0)
VLDL: 19.4 mg/dL (ref 0.0–40.0)

## 2016-02-08 LAB — CBC WITH DIFFERENTIAL/PLATELET
BASOS ABS: 0 10*3/uL (ref 0.0–0.1)
BASOS PCT: 0.4 % (ref 0.0–3.0)
EOS ABS: 0 10*3/uL (ref 0.0–0.7)
Eosinophils Relative: 0.8 % (ref 0.0–5.0)
HEMATOCRIT: 42.9 % (ref 36.0–46.0)
HEMOGLOBIN: 14.3 g/dL (ref 12.0–15.0)
LYMPHS PCT: 38.8 % (ref 12.0–46.0)
Lymphs Abs: 1.3 10*3/uL (ref 0.7–4.0)
MCHC: 33.4 g/dL (ref 30.0–36.0)
MCV: 90.2 fl (ref 78.0–100.0)
MONO ABS: 0.3 10*3/uL (ref 0.1–1.0)
Monocytes Relative: 10.1 % (ref 3.0–12.0)
Neutro Abs: 1.6 10*3/uL (ref 1.4–7.7)
Neutrophils Relative %: 49.9 % (ref 43.0–77.0)
Platelets: 206 10*3/uL (ref 150.0–400.0)
RBC: 4.75 Mil/uL (ref 3.87–5.11)
RDW: 13.3 % (ref 11.5–15.5)
WBC: 3.3 10*3/uL — AB (ref 4.0–10.5)

## 2016-02-08 LAB — HEPATIC FUNCTION PANEL
ALBUMIN: 4.1 g/dL (ref 3.5–5.2)
ALT: 10 U/L (ref 0–35)
AST: 13 U/L (ref 0–37)
Alkaline Phosphatase: 68 U/L (ref 39–117)
BILIRUBIN DIRECT: 0.2 mg/dL (ref 0.0–0.3)
TOTAL PROTEIN: 6.1 g/dL (ref 6.0–8.3)
Total Bilirubin: 0.8 mg/dL (ref 0.2–1.2)

## 2016-02-08 LAB — GLUCOSE, POCT (MANUAL RESULT ENTRY): POC GLUCOSE: 103 mg/dL — AB (ref 70–99)

## 2016-02-08 LAB — TSH: TSH: 2.24 u[IU]/mL (ref 0.35–4.50)

## 2016-02-08 MED ORDER — TRAZODONE HCL 50 MG PO TABS: 50.0000 mg | ORAL_TABLET | Freq: Every day | ORAL | Status: DC

## 2016-02-08 MED ORDER — CITALOPRAM HYDROBROMIDE 40 MG PO TABS: 40.0000 mg | ORAL_TABLET | Freq: Every day | ORAL | Status: DC

## 2016-02-08 MED ORDER — MEMANTINE HCL ER 14 MG PO CP24: 14.0000 mg | ORAL_CAPSULE | Freq: Every day | ORAL | Status: DC

## 2016-02-08 NOTE — Progress Notes (Signed)
BH MD/PA/NP OP Progress Note  02/08/2016 2:23 PM Karen Collins  MRN:  [id number]  Subjective:    Pt is a 82 yo female who presented for follow up after she was last seen in November. Patient reported that she was lost and went to the second floor. She continues to drink regularly and reported that her last drink was 3 days ago. She stated that she has worsening of her memory. She brought up back full of empty bottles and some has a few pills and then. She reported that she is experiencing anxiety and wants her medications filled. I called her [facility] and they reported that she last fair lorazepam in March. She stated that she wants medications to help with her anxiety at this time. She was confused about her medications. She continues to have problems with her memory as well. She reported that she takes the pills to help her sleep at night. She currently lives by herself. She currently denied having any perceptual disturbances. She denied having any suicidal homicidal ideations or plans. Patient reported that her friends and her daughter helps her with her medications and with her activities of daily living.     Chief Complaint:  Chief Complaint    Follow-up; Medication Refill; Anxiety; Panic Attack; Stress     Visit Diagnosis:     ICD-9-CM ICD-10-CM   1. MDD (major depressive disorder), recurrent episode, moderate (HCC) 296.32 F33.1   2. Alcohol dependence with alcohol-induced mood disorder (HCC) 303.90 F10.24    291.89      Past Medical History:  Past Medical History  Diagnosis Date  . Hyperlipidemia   . Hypertension   . CAD (coronary artery disease)   . MI (myocardial infarction) ([place])     Non-ST elevation MI  . GI bleed   . Anxiety   . Chronic kidney disease (CKD), stage III (moderate)   . Depression   . GERD (gastroesophageal reflux disease)   . Chronic systolic congestive heart failure (HCC)     EF of 35 %   . Arthritis   . AAA (abdominal aortic  aneurysm) ([place])     s/p stent   . Kidney stones   . Diverticulitis     H/O  . Hx of migraines   . Thyroid disease   . Hx: UTI (urinary tract infection)   . Renal cell carcinoma     Past Surgical History  Procedure Laterality Date  . Cardiac catheterization    . Cardiac catheterization  09/18/2010  . Coronary stent placement  09/18/2010    CAD; MI s/p stent  . Nephrectomy  10/2008    Right  . Shoulder surgery  07/2010    Right  . Abdominal aortic aneurysm repair      s/p stent  . Cholecystectomy    . Thyroidectomy    . Laparoscopic partial nephrectomy  2010  . Parathyroidectomy    . Abdominal aortic endovascular stent graft     Family History:  Family History  Problem Relation Age of Onset  . Arthritis Mother   . Heart disease Mother   . Pulmonary fibrosis Mother   . Heart disease Father   . Hodgkin's lymphoma Father   . Breast cancer Sister   . Hyperlipidemia Sister    Social History:  Social History   Social History  . Marital Status: Divorced    Spouse Name: N/A  . Number of Children: N/A  . Years of Education: N/A   Occupational History  .  Retired    Social History Main Topics  . Smoking status: Former Smoker -- 1.00 packs/day for 30 years    Quit date: 10/01/2005  . Smokeless tobacco: Never Used  . Alcohol Use: 4.2 oz/week    4 Glasses of wine, 3 Standard drinks or equivalent, 0 Cans of beer, 0 Shots of liquor per week     Comment: Wine 2-3 times weekly  . Drug Use: No  . Sexual Activity: No   Other Topics Concern  . None   Social History Narrative   Married   Psychologist, counselling occasionally with house work   Additional History: Lives alone .  Assessment:   Musculoskeletal: Strength & Muscle Tone: within normal limits Gait & Station: normal Patient leans: N/A  Psychiatric Specialty Exam: Anxiety Symptoms include nervous/anxious behavior. Patient reports no suicidal ideas.      Review of Systems  Constitutional: Negative for fever and weight  loss.  HENT: Negative for hearing loss and nosebleeds.   Eyes: Negative for photophobia.  Respiratory: Negative for sputum production.   Cardiovascular: Positive for orthopnea.  Gastrointestinal: Negative for vomiting and diarrhea.  Genitourinary: Negative for hematuria.  Neurological: Positive for sensory change. Negative for tingling.  Endo/Heme/Allergies: Negative for environmental allergies.  Psychiatric/Behavioral: Positive for depression. Negative for suicidal ideas and hallucinations. The patient is nervous/anxious.     Blood pressure 142/90, pulse 107, temperature 97.8 F (36.6 C), temperature source Tympanic, weight 145 lb 3.2 oz (65.862 kg), SpO2 96 %.Body mass index is 26.55 kg/(m^2).  General Appearance: Fairly Groomed  Eye Contact:  good  Speech:  Normal Rate  Volume:  Normal   Mood:  Anxious  Affect:  Congruent  Thought Process:  Logical  Orientation:  Full (Time, Place, and Person)  Thought Content:  WDL  Suicidal Thoughts:  No  Homicidal Thoughts:  No  Memory:  Immediate;   Fair Recent;   Fair  Judgement:  Fair  Insight:  Fair  Psychomotor Activity:  Normal  Concentration:  Fair  Recall:  [employer] of Knowledge: Fair  Language: Fair  Akathisia:  No  Handed:  Right  AIMS (if indicated):  none  Assets:  Communication Skills Desire for Improvement Physical Health  ADL's:  Intact  Cognition: WNL  Sleep:  6-7    Is the patient at risk to self?  No. Has the patient been a risk to self in the past 6 months?  No. Has the patient been a risk to self within the distant past?  No. Is the patient a risk to others?  No. Has the patient been a risk to others in the past 6 months?  No. Has the patient been a risk to others within the distant past?  No.  Current Medications: Current Outpatient Prescriptions  Medication Sig Dispense Refill  . acetaminophen (TYLENOL) 500 MG tablet Take 325 mg by mouth as needed.     Marland Kitchen aspirin EC 81 MG tablet Take 81 mg by mouth  daily.    Marland Kitchen atorvastatin (LIPITOR) 80 MG tablet Take 80 mg by mouth daily.    . bisacodyl (DULCOLAX) 5 MG EC tablet Take 5 mg by mouth as needed.    . busPIRone (BUSPAR) 5 MG tablet Take 1 tablet (5 mg total) by mouth 2 (two) times daily. 60 tablet 1  . calcium carbonate (CALCIUM 600) 600 MG TABS Take 600 mg by mouth daily.      . carvedilol (COREG) 3.125 MG tablet Take 3.125 mg by mouth 2 (two)  times daily.    . cetirizine (ZYRTEC) 10 MG tablet Take 10 mg by mouth daily.    . Cholecalciferol (VITAMIN D) 400 UNITS capsule Take 400 Units by mouth daily.    . citalopram (CELEXA) 20 MG tablet Take 1 tablet (20 mg total) by mouth daily. 30 tablet 1  . Cyanocobalamin 1000 MCG TBCR Take 1 tablet by mouth daily.    Marland Kitchen dicyclomine (BENTYL) 10 MG capsule Take 10 mg by mouth 3 (three) times daily.    . diphenhydrAMINE (BENADRYL) 25 MG tablet Take 25 mg by mouth daily as needed.      . docusate sodium (COLACE) 100 MG capsule Take 100 mg by mouth daily as needed.    . Glucosamine-Chondroit-Vit C-Mn (GLUCOSAMINE 1500 COMPLEX) CAPS Take by mouth.      . hyoscyamine (LEVBID) 0.375 MG 12 hr tablet Take 0.375 mg by mouth every 12 (twelve) hours as needed for cramping.    Marland Kitchen ibuprofen (ADVIL,MOTRIN) 600 MG tablet Take 600 mg by mouth as needed.    Marland Kitchen lisinopril (PRINIVIL,ZESTRIL) 5 MG tablet TAKE 1 TABLET BY MOUTH DAILY 90 tablet 4  . loperamide (ANTI-DIARRHEAL) 2 MG tablet Take 2 mg by mouth as needed.    Marland Kitchen LORazepam (ATIVAN) 0.5 MG tablet Take 0.5 tablets (0.25 mg total) by mouth daily after breakfast. 15 tablet 0  . memantine (NAMENDA) 10 MG tablet Take 1 tablet (10 mg total) by mouth daily. 30 tablet 1  . Multiple Vitamin (MULTIVITAMIN) tablet Take 1 tablet by mouth daily.      . nitroGLYCERIN (NITROSTAT) 0.4 MG SL tablet Place 1 tablet (0.4 mg total) under the tongue every 5 (five) minutes as needed. 30 tablet 1  . Omega-3 Fatty Acids (FISH OIL) 1200 MG CAPS Take 1,200 mg by mouth daily.    . phenylephrine  (SUDAFED PE) 10 MG TABS tablet Take 10 mg by mouth as needed.    . simvastatin (ZOCOR) 40 MG tablet TAKE ONE TABLET BY MOUTH EVERY NIGHT AT BEDTIME 90 tablet 2  . traZODone (DESYREL) 50 MG tablet Take 1 tablet (50 mg total) by mouth at bedtime. 30 tablet 3  . triamterene-hydrochlorothiazide (DYAZIDE) 37.5-25 MG per capsule Take 1 capsule by mouth daily.    . vitamin E 400 UNIT capsule Take 400 Units by mouth daily.     No current facility-administered medications for this visit.    Medical Decision Making:  Established Problem, Stable/Improving (1)  Treatment Plan Summary:Medication management   Memory impairment Start her on Namenda XR 14 mg by mouth daily.   Depression Continue  Celexa 20 mg by mouth daily   Insomnia Patient is currently on trazodone 50 mg by mouth daily at bedtime  Also advised patient to start taking probiotics to help with her GI symptoms as she continues to have diarrhea related to her anxiety  Follow-up Advised patient to follow up in 2 weeks  or earlier depending on her symptoms    More than 50% of the time spent in psychoeducation, counseling and coordination of care related to her anxiety and memory  Time spent with the patient 25 minutes   This note was generated in part or whole with voice recognition software. Voice regonition is usually quite accurate but there are transcription errors that can and very often do occur. I apologize for any typographical errors that were not detected and corrected.     Rainey Pines, MD  02/08/2016, 2:23 PM

## 2016-02-08 NOTE — Progress Notes (Signed)
Patient ID: Karen Collins, female   DOB: 02-Aug-1934, 82 y.o.   MRN: [id number]   Subjective:    Patient ID: Karen Collins, female    DOB: [birth year], 82 y.o.   MRN: [id number]  HPI  Patient here for a scheduled follow up.  She is accompanied by her sister.  History obtained from both of them.  She has not seen her psychiatrist since the fall.  Unclear how she has been taking her medication.  Increased anxiety today.  Eating and drinking well.  Still walking and eating with her friend.  This helps her.  No chest pain.  No sob.  No acid reflux.  Abdomina pain better.  Bowels stable.     Past Medical History  Diagnosis Date  . Hyperlipidemia   . Hypertension   . CAD (coronary artery disease)   . MI (myocardial infarction) ([place])     Non-ST elevation MI  . GI bleed   . Anxiety   . Chronic kidney disease (CKD), stage III (moderate)   . Depression   . GERD (gastroesophageal reflux disease)   . Chronic systolic congestive heart failure (HCC)     EF of 35 %   . Arthritis   . AAA (abdominal aortic aneurysm) ([place])     s/p stent   . Kidney stones   . Diverticulitis     H/O  . Hx of migraines   . Thyroid disease   . Hx: UTI (urinary tract infection)   . Renal cell carcinoma    Past Surgical History  Procedure Laterality Date  . Cardiac catheterization    . Cardiac catheterization  09/18/2010  . Coronary stent placement  09/18/2010    CAD; MI s/p stent  . Nephrectomy  10/2008    Right  . Shoulder surgery  07/2010    Right  . Abdominal aortic aneurysm repair      s/p stent  . Cholecystectomy    . Thyroidectomy    . Laparoscopic partial nephrectomy  2010  . Parathyroidectomy    . Abdominal aortic endovascular stent graft     Family History  Problem Relation Age of Onset  . Arthritis Mother   . Heart disease Mother   . Pulmonary fibrosis Mother   . Heart disease Father   . Hodgkin's lymphoma Father   . Breast cancer Sister   . Hyperlipidemia Sister    Social  History   Social History  . Marital Status: Divorced    Spouse Name: N/A  . Number of Children: N/A  . Years of Education: N/A   Occupational History  . Retired    Social History Main Topics  . Smoking status: Former Smoker -- 1.00 packs/day for 30 years    Quit date: 10/01/2005  . Smokeless tobacco: Never Used  . Alcohol Use: 4.2 oz/week    4 Glasses of wine, 3 Standard drinks or equivalent, 0 Cans of beer, 0 Shots of liquor per week     Comment: Wine 2-3 times weekly  . Drug Use: No  . Sexual Activity: No   Other Topics Concern  . None   Social History Narrative   Married   Walks occasionally with house work    Outpatient Encounter Prescriptions as of 02/08/2016  Medication Sig  . acetaminophen (TYLENOL) 500 MG tablet Take 325 mg by mouth as needed.   Marland Kitchen aspirin EC 81 MG tablet Take 81 mg by mouth daily.  Marland Kitchen atorvastatin (LIPITOR) 80 MG tablet Take 80 mg  by mouth daily.  . bisacodyl (DULCOLAX) 5 MG EC tablet Take 5 mg by mouth as needed.  . calcium carbonate (CALCIUM 600) 600 MG TABS Take 600 mg by mouth daily.    . carvedilol (COREG) 3.125 MG tablet Take 3.125 mg by mouth 2 (two) times daily.  . cetirizine (ZYRTEC) 10 MG tablet Take 10 mg by mouth daily.  . Cholecalciferol (VITAMIN D) 400 UNITS capsule Take 400 Units by mouth daily.  . Cyanocobalamin 1000 MCG TBCR Take 1 tablet by mouth daily.  Marland Kitchen dicyclomine (BENTYL) 10 MG capsule Take 10 mg by mouth 3 (three) times daily.  . diphenhydrAMINE (BENADRYL) 25 MG tablet Take 25 mg by mouth daily as needed.    . docusate sodium (COLACE) 100 MG capsule Take 100 mg by mouth daily as needed.  . Glucosamine-Chondroit-Vit C-Mn (GLUCOSAMINE 1500 COMPLEX) CAPS Take by mouth.    . hyoscyamine (LEVBID) 0.375 MG 12 hr tablet Take 0.375 mg by mouth every 12 (twelve) hours as needed for cramping.  Marland Kitchen ibuprofen (ADVIL,MOTRIN) 600 MG tablet Take 600 mg by mouth as needed.  Marland Kitchen lisinopril (PRINIVIL,ZESTRIL) 5 MG tablet TAKE 1 TABLET BY MOUTH  DAILY  . loperamide (ANTI-DIARRHEAL) 2 MG tablet Take 2 mg by mouth as needed.  . Multiple Vitamin (MULTIVITAMIN) tablet Take 1 tablet by mouth daily.    . nitroGLYCERIN (NITROSTAT) 0.4 MG SL tablet Place 1 tablet (0.4 mg total) under the tongue every 5 (five) minutes as needed.  . Omega-3 Fatty Acids (FISH OIL) 1200 MG CAPS Take 1,200 mg by mouth daily.  . phenylephrine (SUDAFED PE) 10 MG TABS tablet Take 10 mg by mouth as needed.  . simvastatin (ZOCOR) 40 MG tablet TAKE ONE TABLET BY MOUTH EVERY NIGHT AT BEDTIME  . triamterene-hydrochlorothiazide (DYAZIDE) 37.5-25 MG per capsule Take 1 capsule by mouth daily.  . vitamin E 400 UNIT capsule Take 400 Units by mouth daily.  . [DISCONTINUED] busPIRone (BUSPAR) 5 MG tablet Take 1 tablet (5 mg total) by mouth 2 (two) times daily.  . [DISCONTINUED] citalopram (CELEXA) 20 MG tablet Take 1 tablet (20 mg total) by mouth daily.  . [DISCONTINUED] LORazepam (ATIVAN) 0.5 MG tablet Take 0.5 tablets (0.25 mg total) by mouth daily after breakfast.  . [DISCONTINUED] memantine (NAMENDA) 10 MG tablet Take 1 tablet (10 mg total) by mouth daily.  . [DISCONTINUED] traZODone (DESYREL) 50 MG tablet Take 1 tablet (50 mg total) by mouth at bedtime.   No facility-administered encounter medications on file as of 02/08/2016.    Review of Systems  Constitutional: Negative for appetite change and unexpected weight change.  HENT: Negative for congestion and sinus pressure.   Respiratory: Negative for cough, chest tightness and shortness of breath.   Cardiovascular: Negative for chest pain, palpitations and leg swelling.  Gastrointestinal: Negative for nausea, vomiting, abdominal pain and diarrhea.  Genitourinary: Negative for dysuria and difficulty urinating.  Musculoskeletal: Negative for back pain and joint swelling.  Skin: Negative for color change and rash.  Neurological: Negative for dizziness, light-headedness and headaches.  Psychiatric/Behavioral: Positive for  sleep disturbance.       Increased anxiety.         Objective:    Physical Exam  Constitutional: She appears well-developed and well-nourished. No distress.  HENT:  Nose: Nose normal.  Mouth/Throat: Oropharynx is clear and moist.  Neck: Neck supple. No thyromegaly present.  Cardiovascular: Normal rate and regular rhythm.   Pulmonary/Chest: Breath sounds normal. No respiratory distress. She has no wheezes.  Abdominal:  Soft. Bowel sounds are normal. There is no tenderness.  Musculoskeletal: She exhibits no edema or tenderness.  Lymphadenopathy:    She has no cervical adenopathy.  Skin: No rash noted. No erythema.  Psychiatric: She has a normal mood and affect. Her behavior is normal.    BP 140/80 mmHg  Pulse 92  Temp(Src) 98 F (36.7 C) (Oral)  Resp 18  Ht 5\' 2"  (1.575 m)  Wt 142 lb 12 oz (64.751 kg)  BMI 26.10 kg/m2  SpO2 99% Wt Readings from Last 3 Encounters:  02/08/16 145 lb 3.2 oz (65.862 kg)  02/08/16 142 lb 12 oz (64.751 kg)  11/09/15 141 lb 12 oz (64.297 kg)     Lab Results  Component Value Date   WBC 3.3* 02/08/2016   HGB 14.3 02/08/2016   HCT 42.9 02/08/2016   PLT 206.0 02/08/2016   GLUCOSE 83 02/08/2016   CHOL 200 02/08/2016   TRIG 97.0 02/08/2016   HDL 65.30 02/08/2016   LDLCALC 115* 02/08/2016   ALT 10 02/08/2016   AST 13 02/08/2016   NA 136 02/08/2016   K 4.0 02/08/2016   CL 105 02/08/2016   CREATININE 1.15 02/08/2016   BUN 12 02/08/2016   CO2 22 02/08/2016   TSH 2.24 02/08/2016   INR 1.0 03/27/2012   HGBA1C 5.6 03/27/2012    Mm Screening Breast Tomo Bilateral  08/10/2015  CLINICAL DATA:  Screening. EXAM: DIGITAL SCREENING BILATERAL MAMMOGRAM WITH 3D TOMO WITH CAD COMPARISON:  Previous exam(s). ACR Breast Density Category c: The breast tissue is heterogeneously dense, which may obscure small masses. FINDINGS: There are no findings suspicious for malignancy. Images were processed with CAD. IMPRESSION: No mammographic evidence of malignancy. A  result letter of this screening mammogram will be mailed directly to the patient. RECOMMENDATION: Screening mammogram in one year. (Code:[id number]-B-01Y) BI-RADS CATEGORY  1: Negative. Electronically Signed   By: Abelardo Diesel M.D.   On: 08/10/2015 13:56       Assessment & Plan:   Problem List Items Addressed This Visit    AAA (abdominal aortic aneurysm) (HCC)    Sees Dr Lucky Cowboy.       Anemia, iron deficiency   Anxiety    Followed by psychiatry.  Overdue.  Appt scheduled for today.       CKD (chronic kidney disease), stage III    Check metabolic panel.       Coronary atherosclerosis    Continue risk factor modification.  Has seen Dr Rockey Situ.       Depression    Followed by psychiatry.  Unclear how taking medication.  Overdue f/u.  appt arranged for today.       Essential hypertension, benign    Blood pressure under good control.  Continue same medication regimen.  Follow pressures.  Follow metabolic panel.        Hypercholesterolemia    On simvastatin.  Low cholesterol diet and exercise.  Follow lipid panel.       Hyperparathyroidism ([place])    Is s/p parathyroidectomy.  Intact PTH and calcium previously elevated.  Have discussed with her and her sister.  Has declined further evaluation.  Follow.      Leukopenia    Follow cbc.       Memory change    S/p MRI.  Has seen Dr Manuella Ghazi.  On namenda.       Sleeping difficulty    Seeing psychiatry.  Overdue f/u.  Scheduled appt for today.  Other Visit Diagnoses    Lightheaded    -  Primary    Relevant Orders    POCT Glucose (CBG) (Completed)        [name], MD

## 2016-02-08 NOTE — Progress Notes (Signed)
Pre-visit discussion using our clinic review tool. No additional management support is needed unless otherwise documented below in the visit note.  

## 2016-02-09 ENCOUNTER — Encounter: Payer: Self-pay | Admitting: *Deleted

## 2016-02-13 ENCOUNTER — Telehealth: Payer: Self-pay

## 2016-02-13 ENCOUNTER — Encounter: Payer: Self-pay | Admitting: Internal Medicine

## 2016-02-13 NOTE — Telephone Encounter (Signed)
Pt has long h/o alcohol use and might be going through w/d symptoms. Please advise her to go to ER or urgent care  if she is experiencing anxiety.

## 2016-02-13 NOTE — Assessment & Plan Note (Signed)
Check metabolic panel.  

## 2016-02-13 NOTE — Assessment & Plan Note (Signed)
Seeing psychiatry.  Overdue f/u.  Scheduled appt for today.

## 2016-02-13 NOTE — Assessment & Plan Note (Signed)
S/p MRI.  Has seen Dr Manuella Ghazi.  On namenda.

## 2016-02-13 NOTE — Assessment & Plan Note (Signed)
Follow cbc.  

## 2016-02-13 NOTE — Assessment & Plan Note (Signed)
Sees Dr Lucky Cowboy.

## 2016-02-13 NOTE — Assessment & Plan Note (Signed)
Blood pressure under good control.  Continue same medication regimen.  Follow pressures.  Follow metabolic panel.   

## 2016-02-13 NOTE — Assessment & Plan Note (Signed)
Is s/p parathyroidectomy.  Intact PTH and calcium previously elevated.  Have discussed with her and her sister.  Has declined further evaluation.  Follow.

## 2016-02-13 NOTE — Telephone Encounter (Signed)
pt called states that she is having a lot of anxiety.  she states she doesn't know what is triggering it.  pt states that she has takin Lorazepam .5mg  in the past and she wanted to know if you can call in some to [facility].  Pt was just seen last week.

## 2016-02-13 NOTE — Assessment & Plan Note (Signed)
Continue risk factor modification.  Has seen Dr Rockey Situ.

## 2016-02-13 NOTE — Assessment & Plan Note (Signed)
Followed by psychiatry.  Overdue.  Appt scheduled for today.

## 2016-02-13 NOTE — Assessment & Plan Note (Signed)
On simvastatin.  Low cholesterol diet and exercise.  Follow lipid panel.  

## 2016-02-13 NOTE — Assessment & Plan Note (Signed)
Followed by psychiatry.  Unclear how taking medication.  Overdue f/u.  appt arranged for today.

## 2016-02-14 NOTE — Telephone Encounter (Signed)
PT CALLED STATES SHE NEEDS SOMETHING FOR ANXIETY. PT WAS TOLD THAT PER DR. FAHEEM SHE WOULD NOT REFILL MEDICATION WITHOUT SEEING PATIENT. PT WAS MADE AN APPT FOR FRIDAY. PT KEPT ASKING ME TO CALL IN SOMETHING FOR HER ANXIETY.  TOLD PT THAT I COULD NOT DO THAT WITHOUT THE DR. FAHEEM OK. PT WAS TOLD THAT SHE COULD GO TO ER OR URGENT CARE IF SHE NEEDED TOO.

## 2016-02-14 NOTE — Telephone Encounter (Signed)
COULD NOT LEAVE A MESSAGE.

## 2016-02-17 ENCOUNTER — Ambulatory Visit (INDEPENDENT_AMBULATORY_CARE_PROVIDER_SITE_OTHER): Payer: PPO | Admitting: Psychiatry

## 2016-02-17 ENCOUNTER — Encounter: Payer: Self-pay | Admitting: Psychiatry

## 2016-02-17 VITALS — BP 122/78 | HR 89 | Temp 97.6°F | Ht 62.0 in | Wt 142.8 lb

## 2016-02-17 DIAGNOSIS — F331 Major depressive disorder, recurrent, moderate: Secondary | ICD-10-CM | POA: Diagnosis not present

## 2016-02-17 DIAGNOSIS — F1096 Alcohol use, unspecified with alcohol-induced persisting amnestic disorder: Secondary | ICD-10-CM | POA: Diagnosis not present

## 2016-02-17 MED ORDER — BUSPIRONE HCL 5 MG PO TABS: 5.0000 mg | ORAL_TABLET | Freq: Two times a day (BID) | ORAL | Status: DC

## 2016-02-17 NOTE — Progress Notes (Signed)
BH MD/PA/NP OP Progress Note  02/17/2016 11:59 AM Karen Collins  MRN:  [id number]  Subjective:    Pt is a 82 yo female who presented for follow up appointment accompanied by her sister. She has been calling our office multiple times yesterday as she wants to confirm her appointment. Patient reported that she continues to have anxiety and is looking for a prescription of lorazepam. Patient reported that she was taking lorazepam in the past and she was not given a prescription at her last appointment. We discussed that patient has not taking lorazepam since March. Her sister reported that she continues to be confused and has been drinking on a consistent basis. She will go out with her friends in the evening and will consume 1-2 drinks. Patient was minimizing her use of alcohol intake at this time. Patient reported that she is taking her medications as prescribed and she has problems with sleep. Her sister also reported that she has problems with memory and she was diagnosed with cognitive deficits or Dr. Donneta Romberg, her neurologist. Her daughter was supportive and she reported that patient needs to be on medication to control her anxiety. Her children are in [place] and they are not available for support at this time. Patient currently denied having any suicidal ideations or plans. She remains apprehensive during the interview.     Chief Complaint:  Chief Complaint    Medication Problem; Medication Refill; Anxiety     Visit Diagnosis:     ICD-9-CM ICD-10-CM   1. MDD (major depressive disorder), recurrent episode, moderate (HCC) 296.32 F33.1   2. Alcohol-induced persisting amnestic disorder (HCC) 291.1 F10.96     Past Medical History:  Past Medical History  Diagnosis Date  . Hyperlipidemia   . Hypertension   . CAD (coronary artery disease)   . MI (myocardial infarction) ([place])     Non-ST elevation MI  . GI bleed   . Anxiety   . Chronic kidney disease (CKD), stage III (moderate)   .  Depression   . GERD (gastroesophageal reflux disease)   . Chronic systolic congestive heart failure (HCC)     EF of 35 %   . Arthritis   . AAA (abdominal aortic aneurysm) ([place])     s/p stent   . Kidney stones   . Diverticulitis     H/O  . Hx of migraines   . Thyroid disease   . Hx: UTI (urinary tract infection)   . Renal cell carcinoma     Past Surgical History  Procedure Laterality Date  . Cardiac catheterization    . Cardiac catheterization  09/18/2010  . Coronary stent placement  09/18/2010    CAD; MI s/p stent  . Nephrectomy  10/2008    Right  . Shoulder surgery  07/2010    Right  . Abdominal aortic aneurysm repair      s/p stent  . Cholecystectomy    . Thyroidectomy    . Laparoscopic partial nephrectomy  2010  . Parathyroidectomy    . Abdominal aortic endovascular stent graft     Family History:  Family History  Problem Relation Age of Onset  . Arthritis Mother   . Heart disease Mother   . Pulmonary fibrosis Mother   . Heart disease Father   . Hodgkin's lymphoma Father   . Breast cancer Sister   . Hyperlipidemia Sister    Social History:  Social History   Social History  . Marital Status: Divorced    Spouse Name:  N/A  . Number of Children: N/A  . Years of Education: N/A   Occupational History  . Retired    Social History Main Topics  . Smoking status: Former Smoker -- 1.00 packs/day for 30 years    Quit date: 10/01/2005  . Smokeless tobacco: Never Used  . Alcohol Use: 4.2 oz/week    4 Glasses of wine, 3 Standard drinks or equivalent, 0 Cans of beer, 0 Shots of liquor per week     Comment: Wine 2-3 times weekly  . Drug Use: No  . Sexual Activity: No   Other Topics Concern  . None   Social History Narrative   Married   Psychologist, counselling occasionally with house work   Additional History: Lives alone .  Assessment:   Musculoskeletal: Strength & Muscle Tone: within normal limits Gait & Station: normal Patient leans: N/A  Psychiatric Specialty  Exam: Anxiety Symptoms include nervous/anxious behavior. Patient reports no suicidal ideas.      Review of Systems  Constitutional: Negative for fever and weight loss.  HENT: Negative for hearing loss and nosebleeds.   Eyes: Negative for photophobia.  Respiratory: Negative for sputum production.   Cardiovascular: Positive for orthopnea.  Gastrointestinal: Negative for vomiting and diarrhea.  Genitourinary: Negative for hematuria.  Neurological: Positive for sensory change. Negative for tingling.  Endo/Heme/Allergies: Negative for environmental allergies.  Psychiatric/Behavioral: Positive for depression. Negative for suicidal ideas and hallucinations. The patient is nervous/anxious.     Blood pressure 122/78, pulse 89, temperature 97.6 F (36.4 C), temperature source Tympanic, height 5\' 2"  (1.575 m), weight 142 lb 12.8 oz (64.774 kg), SpO2 97 %.Body mass index is 26.11 kg/(m^2).  General Appearance: Fairly Groomed  Eye Contact:  good  Speech:  Normal Rate  Volume:  Normal   Mood:  Anxious  Affect:  Congruent  Thought Process:  Logical  Orientation:  Full (Time, Place, and Person)  Thought Content:  WDL  Suicidal Thoughts:  No  Homicidal Thoughts:  No  Memory:  Immediate;   Fair Recent;   Fair  Judgement:  Fair  Insight:  Fair  Psychomotor Activity:  Normal  Concentration:  Fair  Recall:  [employer] of Knowledge: Fair  Language: Fair  Akathisia:  No  Handed:  Right  AIMS (if indicated):  none  Assets:  Communication Skills Desire for Improvement Physical Health  ADL's:  Intact  Cognition: WNL  Sleep:  6-7    Is the patient at risk to self?  No. Has the patient been a risk to self in the past 6 months?  No. Has the patient been a risk to self within the distant past?  No. Is the patient a risk to others?  No. Has the patient been a risk to others in the past 6 months?  No. Has the patient been a risk to others within the distant past?  No.  Current  Medications: Current Outpatient Prescriptions  Medication Sig Dispense Refill  . acetaminophen (TYLENOL) 500 MG tablet Take 325 mg by mouth as needed.     Marland Kitchen aspirin EC 81 MG tablet Take 81 mg by mouth daily.    Marland Kitchen atorvastatin (LIPITOR) 80 MG tablet Take 80 mg by mouth daily.    . bisacodyl (DULCOLAX) 5 MG EC tablet Take 5 mg by mouth as needed.    . calcium carbonate (CALCIUM 600) 600 MG TABS Take 600 mg by mouth daily.      . carvedilol (COREG) 3.125 MG tablet Take 3.125 mg by mouth  2 (two) times daily.    . cetirizine (ZYRTEC) 10 MG tablet Take 10 mg by mouth daily.    . Cholecalciferol (VITAMIN D) 400 UNITS capsule Take 400 Units by mouth daily.    . citalopram (CELEXA) 40 MG tablet Take 1 tablet (40 mg total) by mouth daily. 30 tablet 0  . Cyanocobalamin 1000 MCG TBCR Take 1 tablet by mouth daily.    Marland Kitchen dicyclomine (BENTYL) 10 MG capsule Take 10 mg by mouth 3 (three) times daily.    . diphenhydrAMINE (BENADRYL) 25 MG tablet Take 25 mg by mouth daily as needed.      . docusate sodium (COLACE) 100 MG capsule Take 100 mg by mouth daily as needed.    . Glucosamine-Chondroit-Vit C-Mn (GLUCOSAMINE 1500 COMPLEX) CAPS Take by mouth.      . hyoscyamine (LEVBID) 0.375 MG 12 hr tablet Take 0.375 mg by mouth every 12 (twelve) hours as needed for cramping.    Marland Kitchen ibuprofen (ADVIL,MOTRIN) 600 MG tablet Take 600 mg by mouth as needed.    Marland Kitchen lisinopril (PRINIVIL,ZESTRIL) 5 MG tablet TAKE 1 TABLET BY MOUTH DAILY 90 tablet 4  . loperamide (ANTI-DIARRHEAL) 2 MG tablet Take 2 mg by mouth as needed.    . memantine (NAMENDA XR) 14 MG CP24 24 hr capsule Take 1 capsule (14 mg total) by mouth daily. 30 capsule 0  . Multiple Vitamin (MULTIVITAMIN) tablet Take 1 tablet by mouth daily.      . nitroGLYCERIN (NITROSTAT) 0.4 MG SL tablet Place 1 tablet (0.4 mg total) under the tongue every 5 (five) minutes as needed. 30 tablet 1  . Omega-3 Fatty Acids (FISH OIL) 1200 MG CAPS Take 1,200 mg by mouth daily.    .  phenylephrine (SUDAFED PE) 10 MG TABS tablet Take 10 mg by mouth as needed.    . simvastatin (ZOCOR) 40 MG tablet TAKE ONE TABLET BY MOUTH EVERY NIGHT AT BEDTIME 90 tablet 2  . traZODone (DESYREL) 50 MG tablet Take 1 tablet (50 mg total) by mouth at bedtime. 30 tablet 0  . triamterene-hydrochlorothiazide (DYAZIDE) 37.5-25 MG per capsule Take 1 capsule by mouth daily.    . vitamin E 400 UNIT capsule Take 400 Units by mouth daily.    . busPIRone (BUSPAR) 5 MG tablet Take 1 tablet (5 mg total) by mouth 2 (two) times daily. 60 tablet 0   No current facility-administered medications for this visit.    Medical Decision Making:  Established Problem, Stable/Improving (1)  Treatment Plan Summary:Medication management   Memory impairment Continue  Namenda XR 14 mg by mouth daily.  Depression Continue  Celexa 20 mg by mouth daily  Started her on BuSpar 5 mg by mouth twice a day for her anxiety and depression  Insomnia Patient is currently on trazodone 50 mg by mouth daily at bedtime  Discussed with patient her sister at length about the medications treatment risks benefits and alternatives. Advised her to stop drinking alcohol and she agreed with the plan.  Follow-up Advised patient to follow up in 2 weeks  or earlier depending on her symptoms    More than 50% of the time spent in psychoeducation, counseling and coordination of care related to her anxiety and memory  Time spent with the patient 25 minutes   This note was generated in part or whole with voice recognition software. Voice regonition is usually quite accurate but there are transcription errors that can and very often do occur. I apologize for any typographical errors that  were not detected and corrected.     Rainey Pines, MD  02/17/2016, 11:59 AM

## 2016-02-21 ENCOUNTER — Telehealth: Payer: Self-pay | Admitting: *Deleted

## 2016-02-21 NOTE — Telephone Encounter (Signed)
FYI - patient states that she is has a history of anxiety.  Today she is having periods of high anxiety.  Per Dr Anola Gurney last office note "Advised patient to follow up in 2 weeks or earlier depending on her symptoms".  I advised patient to give Dr Anola Gurney office a call for follow up care.

## 2016-02-21 NOTE — Telephone Encounter (Signed)
Spoke with patient and she is "doing better this afternoon".

## 2016-02-21 NOTE — Telephone Encounter (Signed)
Thank you.  Psychiatry follows her for this.  She does need to contact psychiatry.  Let us know if any problems.

## 2016-02-21 NOTE — Telephone Encounter (Signed)
Patient stated that she's having extreme Anxiety this morning. She has requested to have a call for consultation. She ha medication, however she stated that the medication is not helping.  [phone]

## 2016-02-22 ENCOUNTER — Ambulatory Visit: Payer: Self-pay | Admitting: Psychiatry

## 2016-03-02 ENCOUNTER — Ambulatory Visit: Payer: PPO | Admitting: Psychiatry

## 2016-03-05 ENCOUNTER — Telehealth: Payer: Self-pay | Admitting: Internal Medicine

## 2016-03-05 NOTE — Telephone Encounter (Signed)
I do not mind having home health assessment if family desires, but need to know if pt will be on board with someone coming into her home.  Also, they may get social work involved if feel need to.  Are they ok with this.  If so, I can place order for home health.  Make sure we have authorization to discuss with son.

## 2016-03-05 NOTE — Telephone Encounter (Signed)
I spoke with the son again, he is on her DPR  To discuss.  She has two sisters locally here, and they are very concerned that she is not remembering things and has been cancelling appts without them knowing.  They would like the assessment to see if she is willing to have someone, can that referral be placed?

## 2016-03-05 NOTE — Telephone Encounter (Signed)
Spoke with Karen Collins, his mom's sisters have been taking care of her and she is not wanting it admit that she needs help ( due to her dementia).   she is in serious denial per the son.  She is at a point that she won't listen to her family so he is trying to reach out to you and her Doristine Bosworth for advice. He is requesting a home health referral to see what needs can be met to assist as he is afraid she will go down hill fast. Thanks, Please advise.

## 2016-03-05 NOTE — Telephone Encounter (Signed)
Pt son Shanon Brow called stating that his mother needs someone to come in once a day to help care for his mother with her Dementia .Marland Kitchen Please advise pt son Shanon Brow [phone]

## 2016-03-07 ENCOUNTER — Other Ambulatory Visit: Payer: Self-pay | Admitting: Internal Medicine

## 2016-03-07 DIAGNOSIS — R413 Other amnesia: Secondary | ICD-10-CM

## 2016-03-07 NOTE — Progress Notes (Signed)
Order placed for home health referral.

## 2016-03-07 NOTE — Telephone Encounter (Signed)
Order placed for home health referral.

## 2016-03-09 ENCOUNTER — Telehealth: Payer: Self-pay | Admitting: *Deleted

## 2016-03-09 NOTE — Telephone Encounter (Signed)
[facility] did not receive the order, they will need to know the discipline of the order  Fax [phone]

## 2016-03-09 NOTE — Telephone Encounter (Signed)
I have placed order for referral.   Please set up home health for pt.  Thanks

## 2016-03-14 ENCOUNTER — Telehealth: Payer: Self-pay | Admitting: Internal Medicine

## 2016-03-14 NOTE — Telephone Encounter (Signed)
Karen Collins from [facility] called and stated that the patient had originally agreed to service from them. The patient called them prior to them coming to her house and refused all services from them. She has contacted the patients son to let him know but she has not heard anything back from him at this time.

## 2016-03-15 ENCOUNTER — Telehealth: Payer: Self-pay | Admitting: Psychiatry

## 2016-03-15 NOTE — Telephone Encounter (Signed)
Pt son called from [place]. He reported that he is concerned about the patient and they are planning to visit her in the end of July. He reported that she is stubborn and refuses help. He was asking me that if I have any idea when she used to be placed in a memory care unit. I advised him that since patient does not have any release of information to discuss about her health I would like for him to set up an appointment when he is in town so we can discuss about her mental health issues as I do have some concerns about her. He acknowledged and reported that he will discuss with the patient and will call back to set up an appointment.

## 2016-03-15 NOTE — Telephone Encounter (Signed)
Noted.  Per note I received, they talked to sister who is going to try and get her to agree.

## 2016-03-16 ENCOUNTER — Telehealth: Payer: Self-pay | Admitting: Internal Medicine

## 2016-03-16 NOTE — Telephone Encounter (Signed)
Pt Karen Collins son called about

## 2016-03-21 ENCOUNTER — Telehealth: Payer: Self-pay | Admitting: Internal Medicine

## 2016-03-21 NOTE — Telephone Encounter (Signed)
Since you have talked with son earlier., please call and inform daughter that I am out of the office this week.  I have already ok'd home health.  Ms Sagel would not let them in and stated she did not need.  My understanding was that her sister was going to talk with her and try to get her to agree.  Let me know if I need to do anything else.

## 2016-03-21 NOTE — Telephone Encounter (Signed)
Please advise 

## 2016-03-21 NOTE — Telephone Encounter (Signed)
Pt daughter called about wanting to speak to Dr Nicki Reaper about moms care. Wanting to speak about encouraging mom to have home health care come in. Daughter and family feels like she needs that.   Call [facility] @ [phone]. Thank you!

## 2016-03-21 NOTE — Telephone Encounter (Signed)
Left a message for the daughter to return my call. thanks

## 2016-03-23 NOTE — Telephone Encounter (Signed)
Left message to return call 

## 2016-03-23 NOTE — Telephone Encounter (Signed)
Patient's daughter returned the call in reference to mother's care.  Daughter contact :Sharyn Lull  [phone]

## 2016-03-29 NOTE — Telephone Encounter (Signed)
Patient will not have home healthcare come in.  Patient is very sure she does not need them yet and is not ready for them to come in.  Patients family is very adamant that Home healthcare comes in.

## 2016-03-29 NOTE — Telephone Encounter (Signed)
Ok.  Let me know if I need to do anything.   

## 2016-03-29 NOTE — Telephone Encounter (Signed)
Spoke with the daughter and explained the issue we have with getting home health since her mom is refusing.  The daughters and son are coming in a week to take there mom to the beach for her 50 th birthday and they requested to meet with you, the available time they have is when you are going to be out of town.  I suggested to them that they get some more specifics on what needs the patient has while they are here and to have a conversation with their mom (the Patient) so that when she comes for her appt in August we have a discussion about needs.  The family was in agreement.

## 2016-05-02 ENCOUNTER — Ambulatory Visit (INDEPENDENT_AMBULATORY_CARE_PROVIDER_SITE_OTHER): Payer: PPO | Admitting: Internal Medicine

## 2016-05-02 ENCOUNTER — Encounter: Payer: Self-pay | Admitting: Internal Medicine

## 2016-05-02 ENCOUNTER — Encounter (INDEPENDENT_AMBULATORY_CARE_PROVIDER_SITE_OTHER): Payer: Self-pay

## 2016-05-02 VITALS — BP 120/80 | HR 84 | Temp 97.6°F | Resp 18 | Ht 62.0 in | Wt 147.1 lb

## 2016-05-02 DIAGNOSIS — I6529 Occlusion and stenosis of unspecified carotid artery: Secondary | ICD-10-CM

## 2016-05-02 DIAGNOSIS — I2510 Atherosclerotic heart disease of native coronary artery without angina pectoris: Secondary | ICD-10-CM | POA: Diagnosis not present

## 2016-05-02 DIAGNOSIS — D72819 Decreased white blood cell count, unspecified: Secondary | ICD-10-CM

## 2016-05-02 DIAGNOSIS — E7800 Pure hypercholesterolemia, unspecified: Secondary | ICD-10-CM | POA: Diagnosis not present

## 2016-05-02 DIAGNOSIS — F419 Anxiety disorder, unspecified: Secondary | ICD-10-CM

## 2016-05-02 DIAGNOSIS — R413 Other amnesia: Secondary | ICD-10-CM

## 2016-05-02 DIAGNOSIS — F32A Depression, unspecified: Secondary | ICD-10-CM

## 2016-05-02 DIAGNOSIS — T148 Other injury of unspecified body region: Secondary | ICD-10-CM

## 2016-05-02 DIAGNOSIS — T148XXA Other injury of unspecified body region, initial encounter: Secondary | ICD-10-CM

## 2016-05-02 DIAGNOSIS — I10 Essential (primary) hypertension: Secondary | ICD-10-CM

## 2016-05-02 DIAGNOSIS — F329 Major depressive disorder, single episode, unspecified: Secondary | ICD-10-CM

## 2016-05-02 MED ORDER — MUPIROCIN 2 % EX OINT: 1.0000 "application " | TOPICAL_OINTMENT | Freq: Two times a day (BID) | CUTANEOUS | 0 refills | Status: DC

## 2016-05-02 NOTE — Progress Notes (Signed)
Pre-visit discussion using our clinic review tool. No additional management support is needed unless otherwise documented below in the visit note.  

## 2016-05-02 NOTE — Progress Notes (Signed)
Patient ID: Karen Collins, female   DOB: 05/17/1934, 82 y.o.   MRN: [id number]   Subjective:    Patient ID: Karen Collins, female    DOB: 19-Aug-1934, 82 y.o.   MRN: [id number]  HPI  Patient here for a scheduled follow up.  She is accompanied by her sister.  History obtained from both of them.  Has issues with anxiety.  Increased anxiety today.  Appears to get anxious when she comes into the office.  Seeing psychiatry.  Apparently cancelled her lst appt.  Cancelled because of increased anxiety.  Discussed the need to call for follow up.  She is eating.  Bowels and abdominal pain - not an issue.  Discussed at length today her home situation.  Expressed my concern about her safety and also the need to make sure she is taking her medications regularly.  Discussed assisted living.  Also discussed home health assessment.   She agreed to home health assessment.  She injured her finger.  Bleeding around her nail.  No pain in the finger.  Able to bend without difficulty.  Abrasions on lwer extremity.     Past Medical History:  Diagnosis Date  . AAA (abdominal aortic aneurysm) ([place])    s/p stent   . Anxiety   . Arthritis   . CAD (coronary artery disease)   . Chronic kidney disease (CKD), stage III (moderate)   . Chronic systolic congestive heart failure (HCC)    EF of 35 %   . Depression   . Diverticulitis    H/O  . GERD (gastroesophageal reflux disease)   . GI bleed   . Hx of migraines   . Hx: UTI (urinary tract infection)   . Hyperlipidemia   . Hypertension   . Kidney stones   . MI (myocardial infarction) ([place])    Non-ST elevation MI  . Renal cell carcinoma   . Thyroid disease    Past Surgical History:  Procedure Laterality Date  . ABDOMINAL AORTIC ANEURYSM REPAIR     s/p stent  . ABDOMINAL AORTIC ENDOVASCULAR STENT GRAFT    . CARDIAC CATHETERIZATION    . CARDIAC CATHETERIZATION  09/18/2010  . CHOLECYSTECTOMY    . CORONARY STENT PLACEMENT  09/18/2010   CAD; MI s/p stent    . LAPAROSCOPIC PARTIAL NEPHRECTOMY  2010  . NEPHRECTOMY  10/2008   Right  . PARATHYROIDECTOMY    . SHOULDER SURGERY  07/2010   Right  . THYROIDECTOMY     Family History  Problem Relation Age of Onset  . Arthritis Mother   . Heart disease Mother   . Pulmonary fibrosis Mother   . Heart disease Father   . Hodgkin's lymphoma Father   . Breast cancer Sister   . Hyperlipidemia Sister    Social History   Social History  . Marital status: Divorced    Spouse name: N/A  . Number of children: N/A  . Years of education: N/A   Occupational History  . Retired Retired   Social History Main Topics  . Smoking status: Former Smoker    Packs/day: 1.00    Years: 30.00    Quit date: 10/01/2005  . Smokeless tobacco: Never Used  . Alcohol use 4.2 oz/week    4 Glasses of wine, 3 Standard drinks or equivalent per week     Comment: Wine 2-3 times weekly  . Drug use: No  . Sexual activity: No   Other Topics Concern  . None   Social History  Narrative   Married   Walks occasionally with house work     Review of Systems  Constitutional: Negative for appetite change and unexpected weight change.  HENT: Negative for congestion and sinus pressure.   Respiratory: Negative for cough, chest tightness and shortness of breath.   Cardiovascular: Negative for chest pain, palpitations and leg swelling.  Gastrointestinal: Negative for abdominal pain, diarrhea, nausea and vomiting.  Genitourinary: Negative for difficulty urinating and dysuria.  Musculoskeletal: Negative for back pain and joint swelling.  Skin: Negative for color change and rash.       Abrasion on anterior lower leg.  Crusting.  No pain to palpation.  No surrounding increased erythema or warmth.  Some bleeding around the nail bed.    Neurological: Negative for dizziness, light-headedness and headaches.  Psychiatric/Behavioral: Negative for agitation and dysphoric mood.       Objective:    Physical Exam  Constitutional: She  appears well-developed and well-nourished. No distress.  HENT:  Nose: Nose normal.  Mouth/Throat: Oropharynx is clear and moist.  Neck: Neck supple. No thyromegaly present.  Cardiovascular: Normal rate and regular rhythm.   Pulmonary/Chest: Breath sounds normal. No respiratory distress. She has no wheezes.  Abdominal: Soft. Bowel sounds are normal. There is no tenderness.  Musculoskeletal: She exhibits no edema or tenderness.  Lymphadenopathy:    She has no cervical adenopathy.  Skin: No rash noted. No erythema.  Abrasion - anterior lower extremity.  Crusting.  No surrounding increased erythema or warmth.  Bleeding noted from the nail bed.    Psychiatric:  Increased anxiety today.      BP 120/80 (BP Location: Left Arm, Patient Position: Sitting, Cuff Size: Normal)   Pulse 84   Temp 97.6 F (36.4 C) (Oral)   Resp 18   Ht 5' 2"  (1.575 m)   Wt 147 lb 2 oz (66.7 kg)   SpO2 98%   BMI 26.91 kg/m  Wt Readings from Last 3 Encounters:  05/02/16 147 lb 2 oz (66.7 kg)  02/17/16 142 lb 12.8 oz (64.8 kg)  02/08/16 145 lb 3.2 oz (65.9 kg)     Lab Results  Component Value Date   WBC 3.3 (L) 02/08/2016   HGB 14.3 02/08/2016   HCT 42.9 02/08/2016   PLT 206.0 02/08/2016   GLUCOSE 83 02/08/2016   CHOL 200 02/08/2016   TRIG 97.0 02/08/2016   HDL 65.30 02/08/2016   LDLCALC 115 (H) 02/08/2016   ALT 10 02/08/2016   AST 13 02/08/2016   NA 136 02/08/2016   K 4.0 02/08/2016   CL 105 02/08/2016   CREATININE 1.15 02/08/2016   BUN 12 02/08/2016   CO2 22 02/08/2016   TSH 2.24 02/08/2016   INR 1.0 03/27/2012   HGBA1C 5.6 03/27/2012    Mm Screening Breast Tomo Bilateral  Result Date: 08/10/2015 CLINICAL DATA:  Screening. EXAM: DIGITAL SCREENING BILATERAL MAMMOGRAM WITH 3D TOMO WITH CAD COMPARISON:  Previous exam(s). ACR Breast Density Category c: The breast tissue is heterogeneously dense, which may obscure small masses. FINDINGS: There are no findings suspicious for malignancy. Images  were processed with CAD. IMPRESSION: No mammographic evidence of malignancy. A result letter of this screening mammogram will be mailed directly to the patient. RECOMMENDATION: Screening mammogram in one year. (Code:SM-B-01Y) BI-RADS CATEGORY  1: Negative. Electronically Signed   By: Abelardo Diesel M.D.   On: 08/10/2015 13:56       Assessment & Plan:   Problem List Items Addressed This Visit    Abrasion  Skin abrasion on leg as outlined.  bactroban as directed.  Sister reports has improved.  Follow.  Also has the bleeding around her nail bed.  Cleaned and irrigated.  Bactroban.  Follow.  Notify me if any worsening or infection.       Anxiety    Increased anxiety today.  Followed by pscyh.  Going to call for f/u appt.        Carotid stenosis    Followed by [facility].  Last carotid ultrasound - 50-69% bilaterally.  Recommended f/u carotid ultrasound in 6 months  Continue asa and statin.       Coronary atherosclerosis    Has seen Dr Rockey Situ.  Continue risk factor modification.        Depression    Followed by psychiatry.  Increased anxiety today.  She will call to f/u with psych.        Essential hypertension, benign    Blood pressure under good control.  Continue same medication regimen.  Follow pressures.  Follow metabolic panel.        Relevant Orders   TSH   Basic metabolic panel   Hypercholesterolemia    On simvastatin.  Low cholesterol diet and exercise.  Need to make sure taking regularly.  Home health assessment as outlined.  Follow lipid panel and liver function tests.        Relevant Orders   Hepatic function panel   Lipid panel   Leukopenia - Primary   Relevant Orders   CBC with Differential/Platelet   Memory change    Has seen Dr Manuella Ghazi.  Had MRI.  On nemenda.  Discussed assisted living.  She declines.  Home health assessment.        Relevant Orders   Ambulatory referral to [facility]    Other Visit Diagnoses   None.      [name], MD

## 2016-05-02 NOTE — Patient Instructions (Signed)
Apply bactroban ointment twice a day to your finger and the lesion on your leg.   Do this for the next week.

## 2016-05-03 ENCOUNTER — Encounter: Payer: Self-pay | Admitting: Internal Medicine

## 2016-05-03 DIAGNOSIS — T148XXA Other injury of unspecified body region, initial encounter: Secondary | ICD-10-CM | POA: Insufficient documentation

## 2016-05-03 NOTE — Assessment & Plan Note (Signed)
Blood pressure under good control.  Continue same medication regimen.  Follow pressures.  Follow metabolic panel.   

## 2016-05-03 NOTE — Assessment & Plan Note (Signed)
Followed by [facility].  Last carotid ultrasound - 50-69% bilaterally.  Recommended f/u carotid ultrasound in 6 months  Continue asa and statin.

## 2016-05-03 NOTE — Assessment & Plan Note (Signed)
Followed by psychiatry.  Increased anxiety today.  She will call to f/u with psych.

## 2016-05-03 NOTE — Assessment & Plan Note (Signed)
Skin abrasion on leg as outlined.  bactroban as directed.  Sister reports has improved.  Follow.  Also has the bleeding around her nail bed.  Cleaned and irrigated.  Bactroban.  Follow.  Notify me if any worsening or infection.

## 2016-05-03 NOTE — Assessment & Plan Note (Signed)
Has seen Dr Manuella Ghazi.  Had MRI.  On nemenda.  Discussed assisted living.  She declines.  Home health assessment.

## 2016-05-03 NOTE — Assessment & Plan Note (Signed)
Increased anxiety today.  Followed by pscyh.  Going to call for f/u appt.

## 2016-05-03 NOTE — Assessment & Plan Note (Signed)
Has seen Dr Gollan.  Continue risk factor modification.  

## 2016-05-03 NOTE — Assessment & Plan Note (Signed)
On simvastatin.  Low cholesterol diet and exercise.  Need to make sure taking regularly.  Home health assessment as outlined.  Follow lipid panel and liver function tests.

## 2016-05-04 ENCOUNTER — Ambulatory Visit: Payer: PPO | Admitting: Psychiatry

## 2016-05-07 ENCOUNTER — Encounter: Payer: Self-pay | Admitting: Psychiatry

## 2016-05-07 ENCOUNTER — Ambulatory Visit (INDEPENDENT_AMBULATORY_CARE_PROVIDER_SITE_OTHER): Payer: PPO | Admitting: Psychiatry

## 2016-05-07 VITALS — BP 157/75 | HR 54 | Temp 97.4°F | Ht 62.0 in | Wt 143.0 lb

## 2016-05-07 DIAGNOSIS — F331 Major depressive disorder, recurrent, moderate: Secondary | ICD-10-CM | POA: Diagnosis not present

## 2016-05-07 DIAGNOSIS — F1096 Alcohol use, unspecified with alcohol-induced persisting amnestic disorder: Secondary | ICD-10-CM

## 2016-05-07 MED ORDER — BUSPIRONE HCL 5 MG PO TABS: 5.0000 mg | ORAL_TABLET | Freq: Two times a day (BID) | ORAL | 0 refills | Status: DC

## 2016-05-07 MED ORDER — CITALOPRAM HYDROBROMIDE 20 MG PO TABS: 20.0000 mg | ORAL_TABLET | Freq: Every day | ORAL | 0 refills | Status: DC

## 2016-05-07 MED ORDER — MEMANTINE HCL ER 14 MG PO CP24: 14.0000 mg | ORAL_CAPSULE | Freq: Every day | ORAL | 0 refills | Status: DC

## 2016-05-07 NOTE — Progress Notes (Signed)
BH MD/PA/NP OP Progress Note  05/07/2016 10:41 AM Karen Collins  MRN:  [id number]  Subjective:    Pt is a 82 yo female who presented for follow up appointment accompanied by her sister. She has missed her appointment since May and reported that she is currently feeling anxious. She reported that today is not a good day for her as her anxiety is getting worse. She has been out of her medications. Her sister reported that she is very confused and she refused the home health services which are set up by her primary care physician. She was recently evaluated by her PCP office and they are again requesting the home health services. Patient does not allow her sisters to set up the medications for her. Her sisters are trying to get help for her and they will call her and keep an eye on her. Patient continues to drink on a regular basis and she was minimizing her alcohol use at this time. She drinks wine every 2-3 days. She does not remember if she will have withdrawal symptoms. However she has a bruise on her leg as well as on her finger. She continues to report that her memory is bad. Her sister also cannot pinpoint if this is related to her alcohol withdrawal. Patient brought a list of her medications and reported that she wants a refill of her anxiety medications.  She is willing to restart the home health again and is awaiting for their call. She denied having any suicidal homicidal ideations or plans. Sister and family members remains supportive.      Chief Complaint:  Chief Complaint    Follow-up; Medication Refill     Visit Diagnosis:   No diagnosis found.  Past Medical History:  Past Medical History:  Diagnosis Date  . AAA (abdominal aortic aneurysm) ([place])    s/p stent   . Anxiety   . Arthritis   . CAD (coronary artery disease)   . Chronic kidney disease (CKD), stage III (moderate)   . Chronic systolic congestive heart failure (HCC)    EF of 35 %   . Depression   .  Diverticulitis    H/O  . GERD (gastroesophageal reflux disease)   . GI bleed   . Hx of migraines   . Hx: UTI (urinary tract infection)   . Hyperlipidemia   . Hypertension   . Kidney stones   . MI (myocardial infarction) ([place])    Non-ST elevation MI  . Renal cell carcinoma   . Thyroid disease     Past Surgical History:  Procedure Laterality Date  . ABDOMINAL AORTIC ANEURYSM REPAIR     s/p stent  . ABDOMINAL AORTIC ENDOVASCULAR STENT GRAFT    . CARDIAC CATHETERIZATION    . CARDIAC CATHETERIZATION  09/18/2010  . CHOLECYSTECTOMY    . CORONARY STENT PLACEMENT  09/18/2010   CAD; MI s/p stent  . LAPAROSCOPIC PARTIAL NEPHRECTOMY  2010  . NEPHRECTOMY  10/2008   Right  . PARATHYROIDECTOMY    . SHOULDER SURGERY  07/2010   Right  . THYROIDECTOMY     Family History:  Family History  Problem Relation Age of Onset  . Arthritis Mother   . Heart disease Mother   . Pulmonary fibrosis Mother   . Heart disease Father   . Hodgkin's lymphoma Father   . Breast cancer Sister   . Hyperlipidemia Sister    Social History:  Social History   Social History  . Marital status: Divorced  Spouse name: N/A  . Number of children: N/A  . Years of education: N/A   Occupational History  . Retired Retired   Social History Main Topics  . Smoking status: Former Smoker    Packs/day: 1.00    Years: 30.00    Quit date: 10/01/2005  . Smokeless tobacco: Never Used  . Alcohol use 4.2 oz/week    4 Glasses of wine, 3 Standard drinks or equivalent per week     Comment: Wine 2-3 times weekly  . Drug use: No  . Sexual activity: No   Other Topics Concern  . None   Social History Narrative   Married   Psychologist, counselling occasionally with house work   Additional History: Lives alone .  Assessment:   Musculoskeletal: Strength & Muscle Tone: within normal limits Gait & Station: normal Patient leans: N/A  Psychiatric Specialty Exam: Anxiety  Symptoms include nervous/anxious behavior. Patient reports no  suicidal ideas.    Medication Refill  Pertinent negatives include no fever or vomiting.    Review of Systems  Constitutional: Negative for fever and weight loss.  HENT: Negative for hearing loss and nosebleeds.   Eyes: Negative for photophobia.  Respiratory: Negative for sputum production.   Cardiovascular: Positive for orthopnea.  Gastrointestinal: Negative for diarrhea and vomiting.  Genitourinary: Negative for hematuria.  Neurological: Positive for sensory change. Negative for tingling.  Endo/Heme/Allergies: Negative for environmental allergies.  Psychiatric/Behavioral: Positive for depression. Negative for hallucinations and suicidal ideas. The patient is nervous/anxious.     Blood pressure (!) 157/75, pulse (!) 54, temperature 97.4 F (36.3 C), temperature source Oral, height 5\' 2"  (1.575 m), weight 143 lb (64.9 kg).Body mass index is 26.16 kg/m.  General Appearance: Fairly Groomed  Eye Contact:  good  Speech:  Normal Rate  Volume:  Normal   Mood:  Anxious  Affect:  Congruent  Thought Process:  Logical  Orientation:  Full (Time, Place, and Person)  Thought Content:  WDL  Suicidal Thoughts:  No  Homicidal Thoughts:  No  Memory:  Immediate;   Fair Recent;   Fair  Judgement:  Fair  Insight:  Fair  Psychomotor Activity:  Normal  Concentration:  Fair  Recall:  [employer] of Knowledge: Fair  Language: Fair  Akathisia:  No  Handed:  Right  AIMS (if indicated):  none  Assets:  Communication Skills Desire for Improvement Physical Health  ADL's:  Intact  Cognition: WNL  Sleep:  6-7    Is the patient at risk to self?  No. Has the patient been a risk to self in the past 6 months?  No. Has the patient been a risk to self within the distant past?  No. Is the patient a risk to others?  No. Has the patient been a risk to others in the past 6 months?  No. Has the patient been a risk to others within the distant past?  No.  Current Medications: Current Outpatient  Prescriptions  Medication Sig Dispense Refill  . acetaminophen (TYLENOL) 500 MG tablet Take 325 mg by mouth as needed.     Marland Kitchen aspirin EC 81 MG tablet Take 81 mg by mouth daily.    . busPIRone (BUSPAR) 5 MG tablet Take 1 tablet (5 mg total) by mouth 2 (two) times daily. 60 tablet 0  . calcium carbonate (CALCIUM 600) 600 MG TABS Take 600 mg by mouth daily.      . Cholecalciferol (VITAMIN D) 400 UNITS capsule Take 400 Units by mouth daily.    Marland Kitchen  citalopram (CELEXA) 40 MG tablet Take 1 tablet (40 mg total) by mouth daily. 30 tablet 0  . dicyclomine (BENTYL) 10 MG capsule Take 10 mg by mouth 3 (three) times daily.    . diphenhydrAMINE (BENADRYL) 25 MG tablet Take 25 mg by mouth daily as needed.      . docusate sodium (COLACE) 100 MG capsule Take 100 mg by mouth daily as needed.    . Glucosamine-Chondroit-Vit C-Mn (GLUCOSAMINE 1500 COMPLEX) CAPS Take by mouth.      . hyoscyamine (LEVBID) 0.375 MG 12 hr tablet Take 0.375 mg by mouth every 12 (twelve) hours as needed for cramping.    Marland Kitchen ibuprofen (ADVIL,MOTRIN) 600 MG tablet Take 600 mg by mouth as needed.    Marland Kitchen lisinopril (PRINIVIL,ZESTRIL) 5 MG tablet TAKE 1 TABLET BY MOUTH DAILY 90 tablet 4  . loperamide (ANTI-DIARRHEAL) 2 MG tablet Take 2 mg by mouth as needed.    . memantine (NAMENDA XR) 14 MG CP24 24 hr capsule Take 1 capsule (14 mg total) by mouth daily. 30 capsule 0  . Multiple Vitamin (MULTIVITAMIN) tablet Take 1 tablet by mouth daily.      . mupirocin ointment (BACTROBAN) 2 % Place 1 application into the nose 2 (two) times daily. 22 g 0  . nitroGLYCERIN (NITROSTAT) 0.4 MG SL tablet Place 1 tablet (0.4 mg total) under the tongue every 5 (five) minutes as needed. 30 tablet 1  . Omega-3 Fatty Acids (FISH OIL) 1200 MG CAPS Take 1,200 mg by mouth daily.    . phenylephrine (SUDAFED PE) 10 MG TABS tablet Take 10 mg by mouth as needed.    . simvastatin (ZOCOR) 40 MG tablet TAKE ONE TABLET BY MOUTH EVERY NIGHT AT BEDTIME 90 tablet 2  . traZODone  (DESYREL) 50 MG tablet Take 1 tablet (50 mg total) by mouth at bedtime. 30 tablet 0  . vitamin E 400 UNIT capsule Take 400 Units by mouth daily.    Marland Kitchen atorvastatin (LIPITOR) 80 MG tablet Take 80 mg by mouth daily.    . bisacodyl (DULCOLAX) 5 MG EC tablet Take 5 mg by mouth as needed.    . carvedilol (COREG) 3.125 MG tablet Take 3.125 mg by mouth 2 (two) times daily.    . cetirizine (ZYRTEC) 10 MG tablet Take 10 mg by mouth daily.    . Cyanocobalamin 1000 MCG TBCR Take 1 tablet by mouth daily.    Marland Kitchen triamterene-hydrochlorothiazide (DYAZIDE) 37.5-25 MG per capsule Take 1 capsule by mouth daily.     No current facility-administered medications for this visit.     Medical Decision Making:  Established Problem, Stable/Improving (1)  Treatment Plan Summary:Medication management   Memory impairment Continue  Namenda XR 14 mg by mouth daily.  Depression Continue  Celexa 20 mg by mouth daily  Started her on BuSpar 5 mg by mouth twice a day for her anxiety and depression   Discussed with patient her sister at length about the medications treatment risks benefits and alternatives. Advised her to stop drinking alcohol and she agreed with the plan.  Follow-up Advised patient to follow up in 2 weeks  or earlier depending on her symptoms    More than 50% of the time spent in psychoeducation, counseling and coordination of care related to her anxiety and memory  Time spent with the patient 25 minutes   This note was generated in part or whole with voice recognition software. Voice regonition is usually quite accurate but there are transcription errors that can  and very often do occur. I apologize for any typographical errors that were not detected and corrected.     Rainey Pines, MD  05/07/2016, 10:41 AM

## 2016-05-10 ENCOUNTER — Inpatient Hospital Stay
Admission: EM | Admit: 2016-05-10 | Discharge: 2016-05-12 | DRG: 291 | Disposition: A | Discharge status: Home Health | Payer: PPO | Attending: Internal Medicine | Admitting: Internal Medicine

## 2016-05-10 ENCOUNTER — Encounter: Payer: Self-pay | Admitting: *Deleted

## 2016-05-10 ENCOUNTER — Emergency Department: Payer: PPO

## 2016-05-10 ENCOUNTER — Telehealth: Payer: Self-pay

## 2016-05-10 DIAGNOSIS — I5023 Acute on chronic systolic (congestive) heart failure: Secondary | ICD-10-CM | POA: Diagnosis present

## 2016-05-10 DIAGNOSIS — J9601 Acute respiratory failure with hypoxia: Secondary | ICD-10-CM | POA: Diagnosis present

## 2016-05-10 DIAGNOSIS — R0789 Other chest pain: Secondary | ICD-10-CM | POA: Diagnosis not present

## 2016-05-10 DIAGNOSIS — R778 Other specified abnormalities of plasma proteins: Secondary | ICD-10-CM

## 2016-05-10 DIAGNOSIS — I2510 Atherosclerotic heart disease of native coronary artery without angina pectoris: Secondary | ICD-10-CM | POA: Diagnosis present

## 2016-05-10 DIAGNOSIS — R7989 Other specified abnormal findings of blood chemistry: Secondary | ICD-10-CM

## 2016-05-10 DIAGNOSIS — F411 Generalized anxiety disorder: Secondary | ICD-10-CM | POA: Diagnosis not present

## 2016-05-10 DIAGNOSIS — M1990 Unspecified osteoarthritis, unspecified site: Secondary | ICD-10-CM | POA: Diagnosis present

## 2016-05-10 DIAGNOSIS — R0603 Acute respiratory distress: Secondary | ICD-10-CM | POA: Diagnosis present

## 2016-05-10 DIAGNOSIS — K219 Gastro-esophageal reflux disease without esophagitis: Secondary | ICD-10-CM | POA: Diagnosis not present

## 2016-05-10 DIAGNOSIS — Z7982 Long term (current) use of aspirin: Secondary | ICD-10-CM

## 2016-05-10 DIAGNOSIS — Z85528 Personal history of other malignant neoplasm of kidney: Secondary | ICD-10-CM | POA: Diagnosis not present

## 2016-05-10 DIAGNOSIS — R079 Chest pain, unspecified: Secondary | ICD-10-CM

## 2016-05-10 DIAGNOSIS — N183 Chronic kidney disease, stage 3 (moderate): Secondary | ICD-10-CM | POA: Diagnosis not present

## 2016-05-10 DIAGNOSIS — I252 Old myocardial infarction: Secondary | ICD-10-CM | POA: Diagnosis not present

## 2016-05-10 DIAGNOSIS — Z888 Allergy status to other drugs, medicaments and biological substances status: Secondary | ICD-10-CM | POA: Diagnosis not present

## 2016-05-10 DIAGNOSIS — F329 Major depressive disorder, single episode, unspecified: Secondary | ICD-10-CM | POA: Diagnosis not present

## 2016-05-10 DIAGNOSIS — E871 Hypo-osmolality and hyponatremia: Secondary | ICD-10-CM | POA: Diagnosis present

## 2016-05-10 DIAGNOSIS — E785 Hyperlipidemia, unspecified: Secondary | ICD-10-CM | POA: Diagnosis present

## 2016-05-10 DIAGNOSIS — F0390 Unspecified dementia without behavioral disturbance: Secondary | ICD-10-CM | POA: Diagnosis not present

## 2016-05-10 DIAGNOSIS — I5021 Acute systolic (congestive) heart failure: Secondary | ICD-10-CM | POA: Diagnosis not present

## 2016-05-10 DIAGNOSIS — F418 Other specified anxiety disorders: Secondary | ICD-10-CM | POA: Diagnosis not present

## 2016-05-10 DIAGNOSIS — R0602 Shortness of breath: Secondary | ICD-10-CM | POA: Diagnosis not present

## 2016-05-10 DIAGNOSIS — I130 Hypertensive heart and chronic kidney disease with heart failure and stage 1 through stage 4 chronic kidney disease, or unspecified chronic kidney disease: Secondary | ICD-10-CM | POA: Diagnosis not present

## 2016-05-10 DIAGNOSIS — I10 Essential (primary) hypertension: Secondary | ICD-10-CM | POA: Diagnosis present

## 2016-05-10 DIAGNOSIS — E876 Hypokalemia: Secondary | ICD-10-CM | POA: Diagnosis not present

## 2016-05-10 LAB — CBC
HEMATOCRIT: 43 % (ref 35.0–47.0)
Hemoglobin: 14.8 g/dL (ref 12.0–16.0)
MCH: 30.7 pg (ref 26.0–34.0)
MCHC: 34.5 g/dL (ref 32.0–36.0)
MCV: 89.1 fL (ref 80.0–100.0)
PLATELETS: 206 10*3/uL (ref 150–440)
RBC: 4.83 MIL/uL (ref 3.80–5.20)
RDW: 14.1 % (ref 11.5–14.5)
WBC: 6.1 10*3/uL (ref 3.6–11.0)

## 2016-05-10 LAB — URINALYSIS COMPLETE WITH MICROSCOPIC (ARMC ONLY)
BACTERIA UA: NONE SEEN
BILIRUBIN URINE: NEGATIVE
GLUCOSE, UA: NEGATIVE mg/dL
HGB URINE DIPSTICK: NEGATIVE
Ketones, ur: NEGATIVE mg/dL
LEUKOCYTES UA: NEGATIVE
NITRITE: NEGATIVE
Protein, ur: 100 mg/dL — AB
SPECIFIC GRAVITY, URINE: 1.006 (ref 1.005–1.030)
pH: 5 (ref 5.0–8.0)

## 2016-05-10 LAB — COMPREHENSIVE METABOLIC PANEL
ALBUMIN: 1.8 g/dL — AB (ref 3.5–5.0)
ALK PHOS: 34 U/L — AB (ref 38–126)
ALT: 7 U/L — AB (ref 14–54)
AST: 10 U/L — ABNORMAL LOW (ref 15–41)
Anion gap: 1 — ABNORMAL LOW (ref 5–15)
BILIRUBIN TOTAL: 0.2 mg/dL — AB (ref 0.3–1.2)
BUN: 9 mg/dL (ref 6–20)
CALCIUM: 4.5 mg/dL — AB (ref 8.9–10.3)
CO2: 14 mmol/L — AB (ref 22–32)
CREATININE: 0.37 mg/dL — AB (ref 0.44–1.00)
Chloride: 120 mmol/L — ABNORMAL HIGH (ref 101–111)
GFR calc Af Amer: >60 mL/min (ref >60)
GFR calc non Af Amer: >60 mL/min (ref >60)
GLUCOSE: 67 mg/dL (ref 65–99)
Potassium: <2 mmol/L — CL (ref 3.5–5.1)
SODIUM: 135 mmol/L (ref 135–145)
TOTAL PROTEIN: 3.1 g/dL — AB (ref 6.5–8.1)

## 2016-05-10 LAB — BRAIN NATRIURETIC PEPTIDE: B Natriuretic Peptide: 878 pg/mL — ABNORMAL HIGH (ref 0.0–100.0)

## 2016-05-10 LAB — TROPONIN I: Troponin I: 0.04 ng/mL (ref <0.03)

## 2016-05-10 MED ORDER — POTASSIUM CHLORIDE CRYS ER 20 MEQ PO TBCR
40.0000 meq | EXTENDED_RELEASE_TABLET | Freq: Once | ORAL | Status: AC
  Administered 2016-05-10: 40 meq via ORAL
  Filled 2016-05-10: qty 2

## 2016-05-10 MED ORDER — POTASSIUM CHLORIDE 10 MEQ/100ML IV SOLN
10.0000 meq | Freq: Once | INTRAVENOUS | Status: DC
  Filled 2016-05-10: qty 100

## 2016-05-10 MED ORDER — POTASSIUM CHLORIDE 10 MEQ/100ML IV SOLN
10.0000 meq | INTRAVENOUS | Status: AC
Start: 2016-05-11 — End: 2016-05-11
  Administered 2016-05-11 (×6): 10 meq via INTRAVENOUS
  Filled 2016-05-10 (×6): qty 100

## 2016-05-10 MED ORDER — LORAZEPAM 2 MG/ML IJ SOLN
0.5000 mg | Freq: Once | INTRAMUSCULAR | Status: AC
  Administered 2016-05-10: 0.5 mg via INTRAVENOUS
  Filled 2016-05-10: qty 1

## 2016-05-10 MED ORDER — ASPIRIN 81 MG PO CHEW
CHEWABLE_TABLET | ORAL | Status: AC
  Administered 2016-05-10: 324 mg
  Filled 2016-05-10: qty 4

## 2016-05-10 NOTE — ED Notes (Signed)
Pt brought in via ems from home with chest pain and anxiety.  Pt states pain began 1 hour ago after becoming anxious.  Pt feels tightness in chest.   Pt took anxiety pill without relief.  Pt has no n/v/d.  Pt alert.  Family with pt.

## 2016-05-10 NOTE — Telephone Encounter (Signed)
Noted thanks °

## 2016-05-10 NOTE — ED Notes (Signed)
meds were given for anxiety.  Oxygen sats dropped. Pt placed on 3 liters oxygen.  Family with pt.

## 2016-05-10 NOTE — Telephone Encounter (Signed)
This has been taken care of. Sharrie Rothman from [facility] is going by her house again to speak to her.

## 2016-05-10 NOTE — ED Provider Notes (Signed)
[facility] Emergency Department Provider Note   ____________________________________________   First MD Initiated Contact with Patient 05/10/16 2013     (approximate)  I have reviewed the triage vital signs and the nursing notes.   HISTORY  Chief Complaint Chest Pain and Anxiety    HPI Karen Collins is a 82 y.o. female patient reports she's having her usual bad anxiety attack consisting of chest pain or discomfort and shortness of breath. She is not nauseated. He does not have any chest pressure just doesn't feel good. She is not sweaty. Started about 7:30 and is improved considerably but still continues she asked for something for anxiety. She says again that this is her usual anxiety attack. It was severe but is now only moderate nothing seems to bring them on   Past Medical History:  Diagnosis Date  . AAA (abdominal aortic aneurysm) ([place])    s/p stent   . Anxiety   . Arthritis   . CAD (coronary artery disease)   . Chronic kidney disease (CKD), stage III (moderate)   . Chronic systolic congestive heart failure (HCC)    EF of 35 %   . Depression   . Diverticulitis    H/O  . GERD (gastroesophageal reflux disease)   . GI bleed   . Hx of migraines   . Hx: UTI (urinary tract infection)   . Hyperlipidemia   . Hypertension   . Kidney stones   . MI (myocardial infarction) ([place])    Non-ST elevation MI  . Renal cell carcinoma   . Thyroid disease     Patient Active Problem List   Diagnosis Date Noted  . Abrasion 05/03/2016  . Health care maintenance 11/15/2014  . Left carotid bruit 07/11/2014  . Urinary frequency 07/11/2014  . Leukopenia 07/11/2014  . Sleeping difficulty 03/18/2014  . Adaptive colitis 03/11/2014  . Vaginitis 08/09/2013  . Hospital discharge follow-up 07/23/2013  . Anxiety 07/22/2013  . Depression 06/01/2013  . Essential hypertension, benign 06/01/2013  . AAA (abdominal aortic aneurysm) ([place]) 06/01/2013  . Renal  cell cancer ([place]) 06/01/2013  . Hyperparathyroidism ([place]) 06/01/2013  . CKD (chronic kidney disease), stage III 06/01/2013  . Memory change 06/01/2013  . Abdominal pain 06/01/2013  . History of GI bleed 06/01/2013  . Anemia, iron deficiency 06/01/2013  . Portal vein aneurysm 06/01/2013  . ISCHEMIC CARDIOMYOPATHY 11/27/2010  . CAROTID BRUIT, LEFT 11/09/2010  . Hypercholesterolemia 11/08/2010  . Coronary atherosclerosis 11/08/2010  . Carotid stenosis 11/08/2010  . GI BLEEDING 11/08/2010    Past Surgical History:  Procedure Laterality Date  . ABDOMINAL AORTIC ANEURYSM REPAIR     s/p stent  . ABDOMINAL AORTIC ENDOVASCULAR STENT GRAFT    . CARDIAC CATHETERIZATION    . CARDIAC CATHETERIZATION  09/18/2010  . CHOLECYSTECTOMY    . CORONARY STENT PLACEMENT  09/18/2010   CAD; MI s/p stent  . LAPAROSCOPIC PARTIAL NEPHRECTOMY  2010  . NEPHRECTOMY  10/2008   Right  . PARATHYROIDECTOMY    . SHOULDER SURGERY  07/2010   Right  . THYROIDECTOMY      Prior to Admission medications   Medication Sig Start Date End Date Taking? Authorizing Provider  acetaminophen (TYLENOL) 500 MG tablet Take 325 mg by mouth as needed.     Historical Provider, MD  aspirin EC 81 MG tablet Take 81 mg by mouth daily.    Historical Provider, MD  atorvastatin (LIPITOR) 80 MG tablet Take 80 mg by mouth daily.  Historical Provider, MD  bisacodyl (DULCOLAX) 5 MG EC tablet Take 5 mg by mouth as needed.    Historical Provider, MD  busPIRone (BUSPAR) 5 MG tablet Take 1 tablet (5 mg total) by mouth 2 (two) times daily. 05/07/16   Rainey Pines, MD  calcium carbonate (CALCIUM 600) 600 MG TABS Take 600 mg by mouth daily.      Historical Provider, MD  carvedilol (COREG) 3.125 MG tablet Take 3.125 mg by mouth 2 (two) times daily.    Historical Provider, MD  cetirizine (ZYRTEC) 10 MG tablet Take 10 mg by mouth daily.    Historical Provider, MD  Cholecalciferol (VITAMIN D) 400 UNITS capsule Take 400 Units by mouth daily.     Historical Provider, MD  citalopram (CELEXA) 20 MG tablet Take 1 tablet (20 mg total) by mouth daily. 05/07/16   Rainey Pines, MD  Cyanocobalamin 1000 MCG TBCR Take 1 tablet by mouth daily.    Historical Provider, MD  dicyclomine (BENTYL) 10 MG capsule Take 10 mg by mouth 3 (three) times daily.    Historical Provider, MD  docusate sodium (COLACE) 100 MG capsule Take 100 mg by mouth daily as needed.    Historical Provider, MD  Glucosamine-Chondroit-Vit C-Mn (GLUCOSAMINE 1500 COMPLEX) CAPS Take by mouth.      Historical Provider, MD  hyoscyamine (LEVBID) 0.375 MG 12 hr tablet Take 0.375 mg by mouth every 12 (twelve) hours as needed for cramping.    Historical Provider, MD  ibuprofen (ADVIL,MOTRIN) 600 MG tablet Take 600 mg by mouth as needed.    Historical Provider, MD  lisinopril (PRINIVIL,ZESTRIL) 5 MG tablet TAKE 1 TABLET BY MOUTH DAILY 06/23/15   [name], MD  loperamide (ANTI-DIARRHEAL) 2 MG tablet Take 2 mg by mouth as needed.    Historical Provider, MD  memantine (NAMENDA XR) 14 MG CP24 24 hr capsule Take 1 capsule (14 mg total) by mouth daily. 05/07/16   Rainey Pines, MD  Multiple Vitamin (MULTIVITAMIN) tablet Take 1 tablet by mouth daily.      Historical Provider, MD  mupirocin ointment (BACTROBAN) 2 % Place 1 application into the nose 2 (two) times daily. 05/02/16   [name], MD  nitroGLYCERIN (NITROSTAT) 0.4 MG SL tablet Place 1 tablet (0.4 mg total) under the tongue every 5 (five) minutes as needed. 07/23/13   Raquel Dagoberto Ligas, NP  Omega-3 Fatty Acids (FISH OIL) 1200 MG CAPS Take 1,200 mg by mouth daily.    Historical Provider, MD  phenylephrine (SUDAFED PE) 10 MG TABS tablet Take 10 mg by mouth as needed.    Historical Provider, MD  simvastatin (ZOCOR) 40 MG tablet TAKE ONE TABLET BY MOUTH EVERY NIGHT AT BEDTIME 11/24/15   [name], MD  triamterene-hydrochlorothiazide (DYAZIDE) 37.5-25 MG per capsule Take 1 capsule by mouth daily.    Historical Provider, MD  vitamin E 400 UNIT  capsule Take 400 Units by mouth daily.    Historical Provider, MD    Allergies Tramadol  Family History  Problem Relation Age of Onset  . Arthritis Mother   . Heart disease Mother   . Pulmonary fibrosis Mother   . Heart disease Father   . Hodgkin's lymphoma Father   . Breast cancer Sister   . Hyperlipidemia Sister     Social History Social History  Substance Use Topics  . Smoking status: Former Smoker    Packs/day: 1.00    Years: 30.00    Quit date: 10/01/2005  . Smokeless tobacco: Never Used  .  Alcohol use 4.2 oz/week    4 Glasses of wine, 3 Standard drinks or equivalent per week     Comment: Wine 2-3 times weekly    Review of Systems Constitutional: No fever/chills Eyes: No visual changes. ENT: No sore throat. Cardiovascular:  chest pain. Respiratory:shortness of breath. Gastrointestinal: No abdominal pain.  No nausea, no vomiting.  No diarrhea.  No constipation. Genitourinary: Negative for dysuria. Musculoskeletal: Negative for back pain. Skin: Negative for rash.  10-point ROS otherwise negative.  ____________________________________________   PHYSICAL EXAM:  VITAL SIGNS: ED Triage Vitals  Enc Vitals Group     BP 05/10/16 2022 (!) 195/99     Pulse Rate 05/10/16 2022 84     Resp 05/10/16 2022 (!) 22     Temp 05/10/16 2022 98.6 F (37 C)     Temp Source 05/10/16 2022 Oral     SpO2 05/10/16 2022 98 %     Weight 05/10/16 2023 150 lb (68 kg)     Height 05/10/16 2023 5\' 4"  (1.626 m)     Head Circumference --      Peak Flow --      Pain Score 05/10/16 2024 5     Pain Loc --      Pain Edu? --      Excl. in [place]? --     Constitutional: Alert and oriented. Well appearing and in no acute distress. Eyes: Conjunctivae are normal. PERRL. EOMI. Head: Atraumatic. Nose: No congestion/rhinnorhea. Mouth/Throat: Mucous membranes are moist.  Oropharynx non-erythematous. Neck: No stridor Cardiovascular: Normal rate, regular rhythm. Grossly normal heart sounds.  Good  peripheral circulation. Respiratory: Normal respiratory effort.  No retractions. Lungs CTAB. Gastrointestinal: Soft and nontender. No distention. No abdominal bruits. No CVA tenderness. Musculoskeletal: No lower extremity tenderness nor edema.  No joint effusions. Neurologic:  Normal speech and language. No gross focal neurologic deficits are appreciated. No gait instability. Skin:  Skin is warm, dry and intact. No rash noted.   ____________________________________________   LABS (all labs ordered are listed, but only abnormal results are displayed)  Labs Reviewed  TROPONIN I - Abnormal; Notable for the following:       Result Value   Troponin I 0.04 (*)    All other components within normal limits  URINALYSIS COMPLETEWITH MICROSCOPIC ([facility] ONLY) - Abnormal; Notable for the following:    Color, Urine STRAW (*)    APPearance CLEAR (*)    Protein, ur 100 (*)    Squamous Epithelial / LPF 0-5 (*)    All other components within normal limits  CBC  COMPREHENSIVE METABOLIC PANEL   ____________________________________________  EKG  EKG read and interpreted by me. Similar to one from 07/13/2013 oral sinus rhythm rate of 87 about 0 axis nonspecific changes ____________________________________________  RADIOLOGY  CLINICAL DATA:  Acute chest pain  EXAM: CHEST  2 VIEW  COMPARISON:  08/03/2013  FINDINGS: Upper limits normal heart size noted.  New interstitial opacities bilaterally are compatible with edema.  Trace bilateral pleural effusions noted.  No pneumothorax identified.  No acute bony abnormalities are present. Right shoulder arthroplasty changes noted.  IMPRESSION: Interstitial pulmonary edema with trace bilateral pleural effusions.  Upper limits normal heart size.   Electronically Signed   By: Margarette Canada M.D. ____________________________________________   PROCEDURES    Procedures  Critical Care performed:    ____________________________________________   INITIAL IMPRESSION / [place] / ED COURSE  Pertinent labs & imaging results that were available during my care of the  patient were reviewed by me and considered in my medical decision making (see chart for details).  Troponin is elevated patient's pain improved something somewhat with nitroglycerin I will admit her  Clinical Course     ____________________________________________   FINAL CLINICAL IMPRESSION(S) / ED DIAGNOSES  Final diagnoses:  Elevated troponin  Chest pain, unspecified chest pain type      NEW MEDICATIONS STARTED DURING THIS VISIT:  New Prescriptions   No medications on file     Note:  This document was prepared using Dragon voice recognition software and may include unintentional dictation errors.    Nena Polio, MD 05/10/16 2157

## 2016-05-10 NOTE — Telephone Encounter (Signed)
Can you assist with this, thanks 

## 2016-05-10 NOTE — Telephone Encounter (Signed)
You can see if they would be agreeable to call her and try again.  I have talked with family members about this.

## 2016-05-10 NOTE — ED Notes (Signed)
Pt up to bathroom with asisstance.  Lab call with calcium of 4.5 and potassium of less than 2   Dr Cinda Quest aware.

## 2016-05-10 NOTE — ED Notes (Signed)
Lab called with troponin of 0.04   Dr Cinda Quest aware

## 2016-05-10 NOTE — Telephone Encounter (Signed)
[facility] note states that patient had home health set up and she refused the service the other day, but wants them to return/ with call ahead warning.    Per the referral notes, patient has refused prior also due to copay.  Referral was to [facility], please advise if there is anything we can do? thanks

## 2016-05-10 NOTE — ED Triage Notes (Signed)
PT BROUGHT IN VIA EMS FROM HOME WITH CHEST PAIN FOR 1 HOUR.  PT STATES I HAVE HUGE ANXIETY AND CHEST PAINS.  INTERMITTENT SOB.  FORMER SMOKER.  PT ALERT.

## 2016-05-11 ENCOUNTER — Inpatient Hospital Stay (HOSPITAL_COMMUNITY)
Admit: 2016-05-11 | Discharge: 2016-05-11 | Disposition: A | Discharge status: Home / Self Care | Payer: PPO | Attending: Internal Medicine | Admitting: Internal Medicine

## 2016-05-11 ENCOUNTER — Encounter: Payer: Self-pay | Admitting: Emergency Medicine

## 2016-05-11 DIAGNOSIS — J9601 Acute respiratory failure with hypoxia: Secondary | ICD-10-CM | POA: Diagnosis not present

## 2016-05-11 DIAGNOSIS — F418 Other specified anxiety disorders: Secondary | ICD-10-CM | POA: Diagnosis not present

## 2016-05-11 DIAGNOSIS — I5023 Acute on chronic systolic (congestive) heart failure: Secondary | ICD-10-CM

## 2016-05-11 DIAGNOSIS — R7989 Other specified abnormal findings of blood chemistry: Secondary | ICD-10-CM

## 2016-05-11 DIAGNOSIS — R079 Chest pain, unspecified: Secondary | ICD-10-CM | POA: Diagnosis not present

## 2016-05-11 DIAGNOSIS — R0602 Shortness of breath: Secondary | ICD-10-CM | POA: Diagnosis not present

## 2016-05-11 DIAGNOSIS — E876 Hypokalemia: Secondary | ICD-10-CM | POA: Diagnosis not present

## 2016-05-11 DIAGNOSIS — K219 Gastro-esophageal reflux disease without esophagitis: Secondary | ICD-10-CM | POA: Diagnosis not present

## 2016-05-11 DIAGNOSIS — I2510 Atherosclerotic heart disease of native coronary artery without angina pectoris: Secondary | ICD-10-CM

## 2016-05-11 DIAGNOSIS — I252 Old myocardial infarction: Secondary | ICD-10-CM | POA: Diagnosis not present

## 2016-05-11 DIAGNOSIS — R0789 Other chest pain: Secondary | ICD-10-CM | POA: Diagnosis not present

## 2016-05-11 DIAGNOSIS — I5021 Acute systolic (congestive) heart failure: Secondary | ICD-10-CM

## 2016-05-11 DIAGNOSIS — F411 Generalized anxiety disorder: Secondary | ICD-10-CM | POA: Diagnosis not present

## 2016-05-11 DIAGNOSIS — N183 Chronic kidney disease, stage 3 (moderate): Secondary | ICD-10-CM | POA: Diagnosis not present

## 2016-05-11 DIAGNOSIS — E785 Hyperlipidemia, unspecified: Secondary | ICD-10-CM | POA: Diagnosis not present

## 2016-05-11 DIAGNOSIS — F0390 Unspecified dementia without behavioral disturbance: Secondary | ICD-10-CM | POA: Diagnosis not present

## 2016-05-11 DIAGNOSIS — R0603 Acute respiratory distress: Secondary | ICD-10-CM | POA: Diagnosis present

## 2016-05-11 DIAGNOSIS — E871 Hypo-osmolality and hyponatremia: Secondary | ICD-10-CM | POA: Diagnosis not present

## 2016-05-11 DIAGNOSIS — Z888 Allergy status to other drugs, medicaments and biological substances status: Secondary | ICD-10-CM | POA: Diagnosis not present

## 2016-05-11 DIAGNOSIS — F329 Major depressive disorder, single episode, unspecified: Secondary | ICD-10-CM | POA: Diagnosis not present

## 2016-05-11 DIAGNOSIS — I130 Hypertensive heart and chronic kidney disease with heart failure and stage 1 through stage 4 chronic kidney disease, or unspecified chronic kidney disease: Secondary | ICD-10-CM | POA: Diagnosis not present

## 2016-05-11 DIAGNOSIS — M1990 Unspecified osteoarthritis, unspecified site: Secondary | ICD-10-CM | POA: Diagnosis not present

## 2016-05-11 DIAGNOSIS — Z7982 Long term (current) use of aspirin: Secondary | ICD-10-CM | POA: Diagnosis not present

## 2016-05-11 DIAGNOSIS — Z85528 Personal history of other malignant neoplasm of kidney: Secondary | ICD-10-CM | POA: Diagnosis not present

## 2016-05-11 LAB — BASIC METABOLIC PANEL
ANION GAP: 7 (ref 5–15)
ANION GAP: 7 (ref 5–15)
Anion gap: 3 — ABNORMAL LOW (ref 5–15)
BUN: 11 mg/dL (ref 6–20)
BUN: 13 mg/dL (ref 6–20)
BUN: 19 mg/dL (ref 6–20)
CHLORIDE: 100 mmol/L — AB (ref 101–111)
CHLORIDE: 99 mmol/L — AB (ref 101–111)
CHLORIDE: 98 mmol/L — AB (ref 101–111)
CO2: 21 mmol/L — ABNORMAL LOW (ref 22–32)
CO2: 22 mmol/L (ref 22–32)
CO2: 21 mmol/L — ABNORMAL LOW (ref 22–32)
CREATININE: 0.79 mg/dL (ref 0.44–1.00)
Calcium: 9.1 mg/dL (ref 8.9–10.3)
Calcium: 9.1 mg/dL (ref 8.9–10.3)
Calcium: 9.3 mg/dL (ref 8.9–10.3)
Creatinine, Ser: 0.93 mg/dL (ref 0.44–1.00)
Creatinine, Ser: 1.02 mg/dL — ABNORMAL HIGH (ref 0.44–1.00)
GFR calc Af Amer: >60 mL/min (ref >60)
GFR calc Af Amer: >60 mL/min (ref >60)
GFR calc non Af Amer: >60 mL/min (ref >60)
GFR, EST AFRICAN AMERICAN: 59 mL/min — AB (ref >60)
GFR, EST NON AFRICAN AMERICAN: 57 mL/min — AB (ref >60)
GFR, EST NON AFRICAN AMERICAN: 51 mL/min — AB (ref >60)
GLUCOSE: 94 mg/dL (ref 65–99)
Glucose, Bld: 117 mg/dL — ABNORMAL HIGH (ref 65–99)
Glucose, Bld: 107 mg/dL — ABNORMAL HIGH (ref 65–99)
POTASSIUM: 4.3 mmol/L (ref 3.5–5.1)
POTASSIUM: 4.7 mmol/L (ref 3.5–5.1)
Potassium: 5.1 mmol/L (ref 3.5–5.1)
SODIUM: 124 mmol/L — AB (ref 135–145)
SODIUM: 128 mmol/L — AB (ref 135–145)
SODIUM: 126 mmol/L — AB (ref 135–145)

## 2016-05-11 LAB — CBC
HCT: 38.7 % (ref 35.0–47.0)
Hemoglobin: 13.4 g/dL (ref 12.0–16.0)
MCH: 30.9 pg (ref 26.0–34.0)
MCHC: 34.6 g/dL (ref 32.0–36.0)
MCV: 89.2 fL (ref 80.0–100.0)
PLATELETS: 192 10*3/uL (ref 150–440)
RBC: 4.33 MIL/uL (ref 3.80–5.20)
RDW: 14.2 % (ref 11.5–14.5)
WBC: 4 10*3/uL (ref 3.6–11.0)

## 2016-05-11 LAB — TROPONIN I
TROPONIN I: 0.06 ng/mL — AB (ref <0.03)
Troponin I: 0.1 ng/mL (ref <0.03)
Troponin I: 0.07 ng/mL (ref <0.03)

## 2016-05-11 LAB — MAGNESIUM: MAGNESIUM: 1.9 mg/dL (ref 1.7–2.4)

## 2016-05-11 LAB — ECHOCARDIOGRAM COMPLETE
HEIGHTINCHES: 64 in
WEIGHTICAEL: 2308.8 [oz_av]

## 2016-05-11 MED ORDER — LORAZEPAM 2 MG/ML IJ SOLN: 1.0000 mg | Freq: Four times a day (QID) | INTRAMUSCULAR | Status: DC | PRN

## 2016-05-11 MED ORDER — BUSPIRONE HCL 5 MG PO TABS
5.0000 mg | ORAL_TABLET | Freq: Two times a day (BID) | ORAL | Status: DC
  Administered 2016-05-11 – 2016-05-12 (×3): 5 mg via ORAL
  Filled 2016-05-11 (×3): qty 1

## 2016-05-11 MED ORDER — ONDANSETRON HCL 4 MG PO TABS: 4.0000 mg | ORAL_TABLET | Freq: Four times a day (QID) | ORAL | Status: DC | PRN

## 2016-05-11 MED ORDER — POTASSIUM CHLORIDE CRYS ER 20 MEQ PO TBCR: 20.0000 meq | EXTENDED_RELEASE_TABLET | Freq: Two times a day (BID) | ORAL | Status: DC

## 2016-05-11 MED ORDER — MAGNESIUM SULFATE 2 GM/50ML IV SOLN
2.0000 g | Freq: Once | INTRAVENOUS | Status: AC
  Administered 2016-05-11: 2 g via INTRAVENOUS
  Filled 2016-05-11: qty 50

## 2016-05-11 MED ORDER — FOLIC ACID 1 MG PO TABS
1.0000 mg | ORAL_TABLET | Freq: Every day | ORAL | Status: DC
  Administered 2016-05-11 – 2016-05-12 (×2): 1 mg via ORAL
  Filled 2016-05-11 (×2): qty 1

## 2016-05-11 MED ORDER — CARVEDILOL 3.125 MG PO TABS
3.1250 mg | ORAL_TABLET | Freq: Two times a day (BID) | ORAL | Status: DC
  Administered 2016-05-11 – 2016-05-12 (×2): 3.125 mg via ORAL
  Filled 2016-05-11 (×2): qty 1

## 2016-05-11 MED ORDER — CITALOPRAM HYDROBROMIDE 20 MG PO TABS
20.0000 mg | ORAL_TABLET | Freq: Every day | ORAL | Status: DC
  Administered 2016-05-11 – 2016-05-12 (×2): 20 mg via ORAL
  Filled 2016-05-11 (×2): qty 1

## 2016-05-11 MED ORDER — SIMVASTATIN 40 MG PO TABS
40.0000 mg | ORAL_TABLET | Freq: Every day | ORAL | Status: DC
  Administered 2016-05-11: 40 mg via ORAL
  Filled 2016-05-11: qty 1

## 2016-05-11 MED ORDER — VITAMIN B-1 100 MG PO TABS
100.0000 mg | ORAL_TABLET | Freq: Every day | ORAL | Status: DC
  Administered 2016-05-11: 100 mg via ORAL
  Filled 2016-05-11 (×2): qty 1

## 2016-05-11 MED ORDER — MEMANTINE HCL ER 14 MG PO CP24
14.0000 mg | ORAL_CAPSULE | Freq: Every day | ORAL | Status: DC
  Administered 2016-05-11 – 2016-05-12 (×2): 14 mg via ORAL
  Filled 2016-05-11 (×2): qty 1

## 2016-05-11 MED ORDER — LORAZEPAM 2 MG PO TABS
0.0000 mg | ORAL_TABLET | Freq: Four times a day (QID) | ORAL | Status: DC
  Administered 2016-05-11: 4 mg via ORAL
  Administered 2016-05-11: 2 mg via ORAL
  Filled 2016-05-11: qty 1
  Filled 2016-05-11: qty 2

## 2016-05-11 MED ORDER — POTASSIUM CHLORIDE CRYS ER 20 MEQ PO TBCR
20.0000 meq | EXTENDED_RELEASE_TABLET | Freq: Two times a day (BID) | ORAL | Status: DC
  Administered 2016-05-11 – 2016-05-12 (×2): 20 meq via ORAL
  Filled 2016-05-11 (×2): qty 1

## 2016-05-11 MED ORDER — LORAZEPAM 1 MG PO TABS: 1.0000 mg | ORAL_TABLET | Freq: Four times a day (QID) | ORAL | Status: DC | PRN

## 2016-05-11 MED ORDER — SODIUM CHLORIDE 0.9 % IV SOLN
1.0000 g | Freq: Once | INTRAVENOUS | Status: AC
  Administered 2016-05-11: 1 g via INTRAVENOUS
  Filled 2016-05-11: qty 10

## 2016-05-11 MED ORDER — ACETAMINOPHEN 650 MG RE SUPP: 650.0000 mg | Freq: Four times a day (QID) | RECTAL | Status: DC | PRN

## 2016-05-11 MED ORDER — ONDANSETRON HCL 4 MG/2ML IJ SOLN: 4.0000 mg | Freq: Four times a day (QID) | INTRAMUSCULAR | Status: DC | PRN

## 2016-05-11 MED ORDER — ACETAMINOPHEN 325 MG PO TABS
650.0000 mg | ORAL_TABLET | Freq: Once | ORAL | Status: AC
  Administered 2016-05-11: 650 mg via ORAL

## 2016-05-11 MED ORDER — ADULT MULTIVITAMIN W/MINERALS CH
1.0000 | ORAL_TABLET | Freq: Every day | ORAL | Status: DC
  Administered 2016-05-11 – 2016-05-12 (×2): 1 via ORAL
  Filled 2016-05-11 (×2): qty 1

## 2016-05-11 MED ORDER — ACETAMINOPHEN 325 MG PO TABS
ORAL_TABLET | ORAL | Status: AC
  Administered 2016-05-11: 650 mg via ORAL
  Filled 2016-05-11: qty 2

## 2016-05-11 MED ORDER — THIAMINE HCL 100 MG/ML IJ SOLN
100.0000 mg | Freq: Every day | INTRAMUSCULAR | Status: DC
  Administered 2016-05-12: 100 mg via INTRAVENOUS
  Filled 2016-05-11: qty 1

## 2016-05-11 MED ORDER — FUROSEMIDE 10 MG/ML IJ SOLN
40.0000 mg | Freq: Two times a day (BID) | INTRAMUSCULAR | Status: DC
  Administered 2016-05-11 – 2016-05-12 (×2): 40 mg via INTRAVENOUS
  Filled 2016-05-11 (×2): qty 4

## 2016-05-11 MED ORDER — POTASSIUM CHLORIDE CRYS ER 20 MEQ PO TBCR
40.0000 meq | EXTENDED_RELEASE_TABLET | Freq: Three times a day (TID) | ORAL | Status: DC
  Administered 2016-05-11: 40 meq via ORAL
  Filled 2016-05-11: qty 2

## 2016-05-11 MED ORDER — SODIUM CHLORIDE 0.9% FLUSH
3.0000 mL | Freq: Two times a day (BID) | INTRAVENOUS | Status: DC
  Administered 2016-05-11 – 2016-05-12 (×4): 3 mL via INTRAVENOUS

## 2016-05-11 MED ORDER — ASPIRIN EC 81 MG PO TBEC
81.0000 mg | DELAYED_RELEASE_TABLET | Freq: Every day | ORAL | Status: DC
  Administered 2016-05-11 – 2016-05-12 (×2): 81 mg via ORAL
  Filled 2016-05-11 (×2): qty 1

## 2016-05-11 MED ORDER — ACETAMINOPHEN 325 MG PO TABS: 650.0000 mg | ORAL_TABLET | Freq: Four times a day (QID) | ORAL | Status: DC | PRN

## 2016-05-11 MED ORDER — ENOXAPARIN SODIUM 40 MG/0.4ML ~~LOC~~ SOLN
40.0000 mg | SUBCUTANEOUS | Status: DC
  Administered 2016-05-11: 40 mg via SUBCUTANEOUS
  Filled 2016-05-11: qty 0.4

## 2016-05-11 MED ORDER — LORAZEPAM 2 MG PO TABS: 0.0000 mg | ORAL_TABLET | Freq: Two times a day (BID) | ORAL | Status: DC

## 2016-05-11 NOTE — H&P (Signed)
K[facility] Collins at [facility] NAME: Karen Collins    MR#:  [id number]  DATE OF BIRTH:  04-20-1934  DATE OF ADMISSION:  05/10/2016  PRIMARY CARE PHYSICIAN: [name], MD   REQUESTING/REFERRING PHYSICIAN: Cinda Quest, MD  CHIEF COMPLAINT:   Chief Complaint  Patient presents with  . Chest Pain  . Anxiety    HISTORY OF PRESENT ILLNESS:  Karen Collins  is a 82 y.o. female who presents with Acute onset shortness of breath. Patient is unable to provide much in way of details on her history and states that she does not remember things very well. She does state that she has a prior history of congestive heart failure, but is unaware if she has ever been on diuretics in the past. She is not currently on diuretics at this time. Workup here initially shows chest x-ray with pulmonary edema and a BNP significantly elevated around 900. She also has significant electrolyte derangements with a potassium to low to detect and a low calcium as well. She does state that she's been feeling weak over the last several days. Patient did also tell the nurse that she has alcohol abuse history. Hospitalists were called for admission and further workup  PAST MEDICAL HISTORY:   Past Medical History:  Diagnosis Date  . AAA (abdominal aortic aneurysm) ([place])    s/p stent   . Anxiety   . Arthritis   . CAD (coronary artery disease)   . Chronic kidney disease (CKD), stage III (moderate)   . Chronic systolic congestive heart failure (HCC)    EF of 35 %   . Depression   . Diverticulitis    H/O  . GERD (gastroesophageal reflux disease)   . GI bleed   . Hx of migraines   . Hx: UTI (urinary tract infection)   . Hyperlipidemia   . Hypertension   . Kidney stones   . MI (myocardial infarction) ([place])    Non-ST elevation MI  . Renal cell carcinoma   . Thyroid disease     PAST SURGICAL HISTORY:   Past Surgical History:  Procedure Laterality Date  . ABDOMINAL  AORTIC ANEURYSM REPAIR     s/p stent  . ABDOMINAL AORTIC ENDOVASCULAR STENT GRAFT    . CARDIAC CATHETERIZATION    . CARDIAC CATHETERIZATION  09/18/2010  . CHOLECYSTECTOMY    . CORONARY STENT PLACEMENT  09/18/2010   CAD; MI s/p stent  . LAPAROSCOPIC PARTIAL NEPHRECTOMY  2010  . NEPHRECTOMY  10/2008   Right  . PARATHYROIDECTOMY    . SHOULDER SURGERY  07/2010   Right  . THYROIDECTOMY      SOCIAL HISTORY:   Social History  Substance Use Topics  . Smoking status: Former Smoker    Packs/day: 1.00    Years: 30.00    Quit date: 10/01/2005  . Smokeless tobacco: Never Used  . Alcohol use 4.2 oz/week    4 Glasses of wine, 3 Standard drinks or equivalent per week     Comment: Wine 2-3 times weekly    FAMILY HISTORY:   Family History  Problem Relation Age of Onset  . Arthritis Mother   . Heart disease Mother   . Pulmonary fibrosis Mother   . Heart disease Father   . Hodgkin's lymphoma Father   . Breast cancer Sister   . Hyperlipidemia Sister     DRUG ALLERGIES:   Allergies  Allergen Reactions  . Tramadol Rash    MEDICATIONS AT HOME:  Prior to Admission medications   Medication Sig Start Date End Date Taking? Authorizing Provider  acetaminophen (TYLENOL) 500 MG tablet Take 325 mg by mouth as needed for mild pain or fever.    Yes Historical Provider, MD  aspirin EC 81 MG tablet Take 81 mg by mouth daily.   Yes Historical Provider, MD  bisacodyl (DULCOLAX) 5 MG EC tablet Take 5 mg by mouth as needed.   Yes Historical Provider, MD  busPIRone (BUSPAR) 5 MG tablet Take 1 tablet (5 mg total) by mouth 2 (two) times daily. 05/07/16  Yes Rainey Pines, MD  calcium carbonate (CALCIUM 600) 600 MG TABS Take 600 mg by mouth daily.     Yes Historical Provider, MD  cetirizine (ZYRTEC) 10 MG tablet Take 10 mg by mouth daily as needed for allergies.    Yes Historical Provider, MD  Cholecalciferol (VITAMIN D) 400 UNITS capsule Take 400 Units by mouth daily.   Yes Historical Provider, MD   citalopram (CELEXA) 20 MG tablet Take 1 tablet (20 mg total) by mouth daily. 05/07/16  Yes Rainey Pines, MD  docusate sodium (COLACE) 100 MG capsule Take 100 mg by mouth daily as needed.   Yes Historical Provider, MD  hyoscyamine (LEVBID) 0.375 MG 12 hr tablet Take 0.375 mg by mouth every 12 (twelve) hours as needed for cramping.   Yes Historical Provider, MD  ibuprofen (ADVIL,MOTRIN) 600 MG tablet Take 600 mg by mouth as needed.   Yes Historical Provider, MD  lisinopril (PRINIVIL,ZESTRIL) 5 MG tablet TAKE 1 TABLET BY MOUTH DAILY 06/23/15  Yes [name], MD  loperamide (ANTI-DIARRHEAL) 2 MG tablet Take 2 mg by mouth as needed for diarrhea or loose stools.    Yes Historical Provider, MD  memantine (NAMENDA XR) 14 MG CP24 24 hr capsule Take 1 capsule (14 mg total) by mouth daily. 05/07/16  Yes Rainey Pines, MD  Multiple Vitamin (MULTIVITAMIN) tablet Take 1 tablet by mouth daily.     Yes Historical Provider, MD  mupirocin ointment (BACTROBAN) 2 % Place 1 application into the nose 2 (two) times daily. 05/02/16  Yes [name], MD  nitroGLYCERIN (NITROSTAT) 0.4 MG SL tablet Place 1 tablet (0.4 mg total) under the tongue every 5 (five) minutes as needed. 07/23/13  Yes Raquel Dagoberto Ligas, NP  Omega-3 Fatty Acids (FISH OIL) 1200 MG CAPS Take 1,200 mg by mouth daily.   Yes Historical Provider, MD  phenylephrine (SUDAFED PE) 10 MG TABS tablet Take 10 mg by mouth as needed (congestion).    Yes Historical Provider, MD  simvastatin (ZOCOR) 40 MG tablet TAKE ONE TABLET BY MOUTH EVERY NIGHT AT BEDTIME 11/24/15  Yes [name], MD  traZODone (DESYREL) 50 MG tablet Take 50 mg by mouth at bedtime as needed for sleep.   Yes Historical Provider, MD    REVIEW OF SYSTEMS:  Review of Systems  Constitutional: Negative for chills, fever, malaise/fatigue and weight loss.  HENT: Negative for ear pain, hearing loss and tinnitus.   Eyes: Negative for blurred vision, double vision, pain and redness.  Respiratory: Positive for  shortness of breath. Negative for cough and hemoptysis.   Cardiovascular: Negative for chest pain, palpitations, orthopnea and leg swelling.  Gastrointestinal: Negative for abdominal pain, constipation, diarrhea, nausea and vomiting.  Genitourinary: Negative for dysuria, frequency and hematuria.  Musculoskeletal: Negative for back pain, joint pain and neck pain.  Skin:       No acne, rash, or lesions  Neurological: Negative for dizziness, tremors, focal weakness and  weakness.  Endo/Heme/Allergies: Negative for polydipsia. Does not bruise/bleed easily.  Psychiatric/Behavioral: Negative for depression. The patient is not nervous/anxious and does not have insomnia.      VITAL SIGNS:   Vitals:   05/10/16 2100 05/10/16 2130 05/10/16 2200 05/10/16 2230  BP: (!) 144/82 137/79 138/85 (!) 156/91  Pulse: 79 82 81 78  Resp: 18 (!) 22 18 19   Temp:      TempSrc:      SpO2: (!) 89% (!) 89% 96% 97%  Weight:      Height:       Wt Readings from Last 3 Encounters:  05/10/16 68 kg (150 lb)  05/07/16 64.9 kg (143 lb)  05/02/16 66.7 kg (147 lb 2 oz)    PHYSICAL EXAMINATION:  Physical Exam  Vitals reviewed. Constitutional: She is oriented to person, place, and time. She appears well-developed and well-nourished. No distress.  HENT:  Head: Normocephalic and atraumatic.  Mouth/Throat: Oropharynx is clear and moist.  Eyes: Conjunctivae and EOM are normal. Pupils are equal, round, and reactive to light. No scleral icterus.  Neck: Normal range of motion. Neck supple. No JVD present. No thyromegaly present.  Cardiovascular: Normal rate, regular rhythm and intact distal pulses.  Exam reveals no gallop and no friction rub.   No murmur heard. Respiratory: Effort normal. No respiratory distress. She has no wheezes. She has rales.  GI: Soft. Bowel sounds are normal. She exhibits no distension. There is no tenderness.  Musculoskeletal: Normal range of motion. She exhibits no edema.  No arthritis, no gout   Lymphadenopathy:    She has no cervical adenopathy.  Neurological: She is alert and oriented to person, place, and time. No cranial nerve deficit.  No dysarthria, no aphasia  Skin: Skin is warm and dry. No rash noted. No erythema.  Psychiatric: She has a normal mood and affect. Her behavior is normal. Judgment and thought content normal.    LABORATORY PANEL:   CBC  Recent Labs Lab 05/10/16 2035  WBC 6.1  HGB 14.8  HCT 43.0  PLT 206   ------------------------------------------------------------------------------------------------------------------  Chemistries   Recent Labs Lab 05/10/16 2112  NA 135  K <2.0*  CL 120*  CO2 14*  GLUCOSE 67  BUN 9  CREATININE 0.37*  CALCIUM 4.5*  AST 10*  ALT 7*  ALKPHOS 34*  BILITOT 0.2*   ------------------------------------------------------------------------------------------------------------------  Cardiac Enzymes  Recent Labs Lab 05/10/16 2112  TROPONINI 0.04*   ------------------------------------------------------------------------------------------------------------------  RADIOLOGY:  Dg Chest 2 View  Result Date: 05/10/2016 CLINICAL DATA:  Acute chest pain EXAM: CHEST  2 VIEW COMPARISON:  08/03/2013 FINDINGS: Upper limits normal heart size noted. New interstitial opacities bilaterally are compatible with edema. Trace bilateral pleural effusions noted. No pneumothorax identified. No acute bony abnormalities are present. Right shoulder arthroplasty changes noted. IMPRESSION: Interstitial pulmonary edema with trace bilateral pleural effusions. Upper limits normal heart size. Electronically Signed   By: Margarette Canada M.D.   On: 05/10/2016 21:03    EKG:   Orders placed or performed during the hospital encounter of 05/10/16  . EKG 12-Lead  . EKG 12-Lead    IMPRESSION AND PLAN:  Principal Problem:   Acute on chronic systolic CHF (congestive heart failure) ([place]) - patient was initially on BiPAP due to low oxygen  saturation. She was weaned off of that and has done well. Chest x-ray shows significant pulmonary edema and her BNP is significantly elevated. She is not listed as taking any diuretics at home. She will need diuresis here,  however we are prohibited from being able to do this time given her extremely low potassium level. Once her potassium is repleted we can begin IV diuresis. Her also getting an echocardiogram, trending her cardiac enzymes, getting a cardiology consult for the morning. Active Problems:   Hypokalemia - replete and check serially until within normal limits   Coronary atherosclerosis - continue home meds   Essential hypertension, benign - currently at goal, continue home meds   Hypocalcemia - 4.5 on lab check, but with a very low albumin so corrected calcium would be something closer to 6. This is still low so we will give her some calcium.   All the records are reviewed and case discussed with ED provider. Management plans discussed with the patient and/or family.  DVT PROPHYLAXIS: SubQ lovenox  GI PROPHYLAXIS: None  ADMISSION STATUS: Inpatient  CODE STATUS: Full Code Status History    This patient does not have a recorded code status. Please follow your organizational policy for patients in this situation.    Advance Directive Documentation   [place] Most Recent Value  Type of Advance Directive  Living will  Pre-existing out of facility DNR order (yellow form or pink MOST form)  No data  "MOST" Form in Place?  No data      TOTAL TIME TAKING CARE OF THIS PATIENT: 45 minutes.    Deleon Passe FIELDING 05/11/2016, 12:04 AM  [facility]  Office  [phone]  CC: Primary care physician; [name], MD

## 2016-05-11 NOTE — Progress Notes (Signed)
*  PRELIMINARY RESULTS* Echocardiogram 2D Echocardiogram has been performed.  Karen Collins 05/11/2016, 3:14 PM

## 2016-05-11 NOTE — Progress Notes (Signed)
Initial Heart Failure Clinic appointment scheduled on May 25, 2016 at 9:00am. Thank you.

## 2016-05-11 NOTE — Progress Notes (Signed)
[facility] PROGRESS NOTE  Karen Collins [id number] DOB: 24-Apr-1934 DOA: 05/10/2016 PCP: [name], MD  HPI/Subjective: Patient states that she felt very lethargic, weak and disoriented. She is very scared and also had a headache. Admitting physician since she was short of breath.  Objective: Vitals:   05/11/16 0907 05/11/16 1138  BP: 120/71 106/64  Pulse: 68 74  Resp: 18 18  Temp: 98.1 F (36.7 C) 97.6 F (36.4 C)    Filed Weights   05/10/16 2023 05/11/16 0202  Weight: 68 kg (150 lb) 65.5 kg (144 lb 4.8 oz)    ROS: Review of Systems  Constitutional: Positive for malaise/fatigue. Negative for chills and fever.  Eyes: Negative for blurred vision.  Respiratory: Positive for shortness of breath. Negative for cough.   Cardiovascular: Negative for chest pain.  Gastrointestinal: Negative for abdominal pain, constipation, diarrhea, nausea and vomiting.  Genitourinary: Negative for dysuria.  Musculoskeletal: Negative for joint pain.  Neurological: Positive for weakness. Negative for dizziness and headaches.   Exam: Physical Exam  Constitutional: She is oriented to person, place, and time.  HENT:  Nose: No mucosal edema.  Mouth/Throat: No oropharyngeal exudate or posterior oropharyngeal edema.  Eyes: Conjunctivae, EOM and lids are normal. Pupils are equal, round, and reactive to light.  Neck: No JVD present. Carotid bruit is not present. No edema present. No thyroid mass and no thyromegaly present.  Cardiovascular: S1 normal and S2 normal.  Exam reveals no gallop.   Murmur heard.  Systolic murmur is present with a grade of 2/6  Pulses:      Dorsalis pedis pulses are 2+ on the right side, and 2+ on the left side.  Respiratory: No respiratory distress. She has no wheezes. She has no rhonchi. She has rales in the right lower field and the left lower field.  GI: Soft. Bowel sounds are normal. There is no tenderness.  Musculoskeletal:       Right ankle: She  exhibits no swelling.       Left ankle: She exhibits no swelling.  Lymphadenopathy:    She has no cervical adenopathy.  Neurological: She is alert and oriented to person, place, and time. No cranial nerve deficit.  Skin: Skin is warm. No rash noted. Nails show no clubbing.  Psychiatric: She has a normal mood and affect.      Data Reviewed: Basic Metabolic Panel:  Recent Labs Lab 05/10/16 2112 05/11/16 0248 05/11/16 0918 05/11/16 1330  NA 135  --  124* 128*  K <2.0*  --  5.1 4.3  CL 120*  --  100* 99*  CO2 14*  --  21* 22  GLUCOSE 67  --  94 117*  BUN 9  --  11 13  CREATININE 0.37*  --  0.79 0.93  CALCIUM 4.5*  --  9.1 9.1  MG  --  1.9  --   --    Liver Function Tests:  Recent Labs Lab 05/10/16 2112  AST 10*  ALT 7*  ALKPHOS 34*  BILITOT 0.2*  PROT 3.1*  ALBUMIN 1.8*   CBC:  Recent Labs Lab 05/10/16 2035 05/11/16 0918  WBC 6.1 4.0  HGB 14.8 13.4  HCT 43.0 38.7  MCV 89.1 89.2  PLT 206 192   Cardiac Enzymes:  Recent Labs Lab 05/10/16 2112 05/11/16 0248 05/11/16 0918 05/11/16 1330  TROPONINI 0.04* 0.10* 0.07* 0.06*   BNP (last 3 results)  Recent Labs  05/10/16 2236  BNP 878.0*     Studies: Dg Chest  2 View  Result Date: 05/10/2016 CLINICAL DATA:  Acute chest pain EXAM: CHEST  2 VIEW COMPARISON:  08/03/2013 FINDINGS: Upper limits normal heart size noted. New interstitial opacities bilaterally are compatible with edema. Trace bilateral pleural effusions noted. No pneumothorax identified. No acute bony abnormalities are present. Right shoulder arthroplasty changes noted. IMPRESSION: Interstitial pulmonary edema with trace bilateral pleural effusions. Upper limits normal heart size. Electronically Signed   By: Margarette Canada M.D.   On: 05/10/2016 21:03    Scheduled Meds: . aspirin EC  81 mg Oral Daily  . busPIRone  5 mg Oral BID  . citalopram  20 mg Oral Daily  . enoxaparin (LOVENOX) injection  40 mg Subcutaneous Q24H  . folic acid  1 mg Oral  Daily  . furosemide  40 mg Intravenous Q12H  . LORazepam  0-4 mg Oral Q6H   Followed by  . [START ON 05/13/2016] LORazepam  0-4 mg Oral Q12H  . memantine  14 mg Oral Daily  . multivitamin with minerals  1 tablet Oral Daily  . [START ON 05/12/2016] potassium chloride  20 mEq Oral BID  . simvastatin  40 mg Oral QHS  . sodium chloride flush  3 mL Intravenous Q12H  . thiamine  100 mg Oral Daily   Or  . thiamine  100 mg Intravenous Daily    Assessment/Plan:  1. Acute hypoxic respiratory failure. When I saw the patient she had her oxygen off. In breathing comfortably. 2. Acute on chronic systolic congestive heart failure. IV Lasix, start low-dose Coreg. Continue lisinopril. 3. Severe hypokalemia. This has been replaced. Last potassium in the normal range. Patient will need potassium with Lasix. 4. Hypomagnesemia this was also replaced. 5. Anxiety depression continue usual medications 6. Borderline troponin likely demand ischemia with CHF 7. Hyponatremia likely with CHF 8. Weakness physical therapy evaluation  Code Status:     Code Status Orders        Start     Ordered   05/11/16 0127  Full code  Continuous     05/11/16 0126    Code Status History    Date Active Date Inactive Code Status Order ID Comments User Context   05/11/2016  1:26 AM 05/11/2016 12:49 PM Full Code [id number]  Lance Coon, MD ED    Advance Directive Documentation   Flowsheet Row Most Recent Value  Type of Advance Directive  Living will  Pre-existing out of facility DNR order (yellow form or pink MOST form)  No data  "MOST" Form in Place?  No data      Disposition Plan: Patient states she would like to go home tomorrow  Consultants:  Cardiology  Time spent: 35 minutes  [place], [place]

## 2016-05-11 NOTE — Consult Note (Signed)
Cardiology Consultation Note  Patient ID: Karen Collins, MRN: [id number], DOB/AGE: [birth year] 82 y.o. Admit date: 05/10/2016   Date of Consult: 05/11/2016 Primary Physician: [name], MD Primary Cardiologist: [name], MD (not seen since 2013) Requesting Physician: [name], MD  Chief Complaint: SOB Reason for Consult: Acute on chronic systolic CHF  HPI: 82 y.o. female with h/o CAD with NSTEMI in 08/2010 with occlusion of the LCx s/p PCI/overlapping stents with residual 70-80% stenosis of the RCA, ICM/chronic systolic CHF with EF of [id number] felt to be out of proportion to CAD, V malformations of the colon, prior GI bleed requiring transfusion that persisted requiring readmission for GI bleed, and anxiety who presented to [facility] with 3-4 day history of increased SOB.   Patient was last seen by [name] in 08/2012 for routine follow up of CAD. She was admitted to [facility] in 03/2012 with small NSTEMI. Cardiac cath showed an occluded mid LCx at previously placed stents with very good collaterals, 60% mid RCA stenosis which was unchanged from before, EF 35%. Medical management was advised. She has not seen a cardiologist since.   She presented to [facility] with complaints of 3-4 days of increased SOB and cough that has been productive of yellow sputum. She does not weigh herself at home and does not take diuretics. She denies any early satiety, orthopnea, or LE edema.   Upon her arrival to [facility] she was found to have BNP of 878, CXr with pulmonary edema, troponin 0.04-->0.10, Mg++ 1.9, unremarkable CBC, K+ <2.0 s/p repletion (repeat labs pending at time of consult), SCr 0.37. NSR, 87 bpm, PACs, no acute s/t changes. She initially required BiPAP due to hypoxia and has since been weaned. Her severe hypokalemia precluded diuresis upon her time of admission. Echo is pending. Currently, breathing is at her baseline though she has not been diuresed. She remains of [place] at this time.    Past Medical History:   Diagnosis Date  . AAA (abdominal aortic aneurysm) ([place])    s/p stent   . Anxiety   . Arthritis   . CAD (coronary artery disease)   . Chronic kidney disease (CKD), stage III (moderate)   . Chronic systolic congestive heart failure (HCC)    EF of 35 %   . Depression   . Diverticulitis    H/O  . GERD (gastroesophageal reflux disease)   . GI bleed   . Hx of migraines   . Hx: UTI (urinary tract infection)   . Hyperlipidemia   . Hypertension   . Kidney stones   . MI (myocardial infarction) ([place])    Non-ST elevation MI  . Renal cell carcinoma   . Thyroid disease       Most Recent Cardiac Studies: As above   Surgical History:  Past Surgical History:  Procedure Laterality Date  . ABDOMINAL AORTIC ANEURYSM REPAIR     s/p stent  . ABDOMINAL AORTIC ENDOVASCULAR STENT GRAFT    . CARDIAC CATHETERIZATION    . CARDIAC CATHETERIZATION  09/18/2010  . CHOLECYSTECTOMY    . CORONARY STENT PLACEMENT  09/18/2010   CAD; MI s/p stent  . LAPAROSCOPIC PARTIAL NEPHRECTOMY  2010  . NEPHRECTOMY  10/2008   Right  . PARATHYROIDECTOMY    . SHOULDER SURGERY  07/2010   Right  . THYROIDECTOMY       Home Meds: Prior to Admission medications   Medication Sig Start Date End Date Taking? Authorizing Provider  acetaminophen (TYLENOL) 500 MG tablet Take 325  mg by mouth as needed for mild pain or fever.    Yes Historical Provider, MD  aspirin EC 81 MG tablet Take 81 mg by mouth daily.   Yes Historical Provider, MD  bisacodyl (DULCOLAX) 5 MG EC tablet Take 5 mg by mouth as needed.   Yes Historical Provider, MD  busPIRone (BUSPAR) 5 MG tablet Take 1 tablet (5 mg total) by mouth 2 (two) times daily. 05/07/16  Yes Rainey Pines, MD  calcium carbonate (CALCIUM 600) 600 MG TABS Take 600 mg by mouth daily.     Yes Historical Provider, MD  cetirizine (ZYRTEC) 10 MG tablet Take 10 mg by mouth daily as needed for allergies.    Yes Historical Provider, MD  Cholecalciferol (VITAMIN D) 400 UNITS capsule Take 400  Units by mouth daily.   Yes Historical Provider, MD  citalopram (CELEXA) 20 MG tablet Take 1 tablet (20 mg total) by mouth daily. 05/07/16  Yes Rainey Pines, MD  docusate sodium (COLACE) 100 MG capsule Take 100 mg by mouth daily as needed.   Yes Historical Provider, MD  hyoscyamine (LEVBID) 0.375 MG 12 hr tablet Take 0.375 mg by mouth every 12 (twelve) hours as needed for cramping.   Yes Historical Provider, MD  ibuprofen (ADVIL,MOTRIN) 600 MG tablet Take 600 mg by mouth as needed.   Yes Historical Provider, MD  lisinopril (PRINIVIL,ZESTRIL) 5 MG tablet TAKE 1 TABLET BY MOUTH DAILY 06/23/15  Yes [name], MD  loperamide (ANTI-DIARRHEAL) 2 MG tablet Take 2 mg by mouth as needed for diarrhea or loose stools.    Yes Historical Provider, MD  memantine (NAMENDA XR) 14 MG CP24 24 hr capsule Take 1 capsule (14 mg total) by mouth daily. 05/07/16  Yes Rainey Pines, MD  Multiple Vitamin (MULTIVITAMIN) tablet Take 1 tablet by mouth daily.     Yes Historical Provider, MD  mupirocin ointment (BACTROBAN) 2 % Place 1 application into the nose 2 (two) times daily. 05/02/16  Yes [name], MD  nitroGLYCERIN (NITROSTAT) 0.4 MG SL tablet Place 1 tablet (0.4 mg total) under the tongue every 5 (five) minutes as needed. 07/23/13  Yes Raquel Dagoberto Ligas, NP  Omega-3 Fatty Acids (FISH OIL) 1200 MG CAPS Take 1,200 mg by mouth daily.   Yes Historical Provider, MD  phenylephrine (SUDAFED PE) 10 MG TABS tablet Take 10 mg by mouth as needed (congestion).    Yes Historical Provider, MD  simvastatin (ZOCOR) 40 MG tablet TAKE ONE TABLET BY MOUTH EVERY NIGHT AT BEDTIME 11/24/15  Yes [name], MD  traZODone (DESYREL) 50 MG tablet Take 50 mg by mouth at bedtime as needed for sleep.   Yes Historical Provider, MD    Inpatient Medications:  . aspirin EC  81 mg Oral Daily  . busPIRone  5 mg Oral BID  . citalopram  20 mg Oral Daily  . enoxaparin (LOVENOX) injection  40 mg Subcutaneous Q24H  . folic acid  1 mg Oral Daily  .  LORazepam  0-4 mg Oral Q6H   Followed by  . [START ON 05/13/2016] LORazepam  0-4 mg Oral Q12H  . memantine  14 mg Oral Daily  . multivitamin with minerals  1 tablet Oral Daily  . potassium chloride  40 mEq Oral TID  . simvastatin  40 mg Oral QHS  . sodium chloride flush  3 mL Intravenous Q12H  . thiamine  100 mg Oral Daily   Or  . thiamine  100 mg Intravenous Daily  Allergies:  Allergies  Allergen Reactions  . Tramadol Rash    Social History   Social History  . Marital status: Divorced    Spouse name: N/A  . Number of children: N/A  . Years of education: N/A   Occupational History  . Retired Retired   Social History Main Topics  . Smoking status: Former Smoker    Packs/day: 1.00    Years: 30.00    Quit date: 10/01/2005  . Smokeless tobacco: Never Used  . Alcohol use 4.2 oz/week    4 Glasses of wine, 3 Standard drinks or equivalent per week     Comment: Wine 2-3 times weekly  . Drug use: No  . Sexual activity: No   Other Topics Concern  . Not on file   Social History Narrative   Married   Walks occasionally with house work     Family History  Problem Relation Age of Onset  . Arthritis Mother   . Heart disease Mother   . Pulmonary fibrosis Mother   . Heart disease Father   . Hodgkin's lymphoma Father   . Breast cancer Sister   . Hyperlipidemia Sister      Review of Systems: Review of Systems  Constitutional: Positive for malaise/fatigue. Negative for chills, diaphoresis, fever and weight loss.  HENT: Negative for congestion.   Eyes: Negative for discharge and redness.  Respiratory: Positive for cough, sputum production, shortness of breath and wheezing. Negative for hemoptysis.   Cardiovascular: Positive for chest pain. Negative for palpitations, orthopnea, claudication, leg swelling and PND.  Gastrointestinal: Negative for abdominal pain, blood in stool, heartburn, melena, nausea and vomiting.  Genitourinary: Negative for hematuria.    Musculoskeletal: Negative for falls and myalgias.  Skin: Negative for rash.  Neurological: Positive for weakness. Negative for dizziness, tingling, tremors, sensory change, speech change, focal weakness and loss of consciousness.  Endo/Heme/Allergies: Does not bruise/bleed easily.  Psychiatric/Behavioral: Negative for substance abuse. The patient is not nervous/anxious.   All other systems reviewed and are negative.   Labs:  Recent Labs  05/10/16 2112 05/11/16 0248 05/11/16 0918  TROPONINI 0.04* 0.10* 0.07*   Lab Results  Component Value Date   WBC 4.0 05/11/2016   HGB 13.4 05/11/2016   HCT 38.7 05/11/2016   MCV 89.2 05/11/2016   PLT 192 05/11/2016     Recent Labs Lab 05/10/16 2112 05/11/16 0918  NA 135 124*  K <2.0* 5.1  CL 120* 100*  CO2 14* 21*  BUN 9 11  CREATININE 0.37* 0.79  CALCIUM 4.5* 9.1  PROT 3.1*  --   BILITOT 0.2*  --   ALKPHOS 34*  --   ALT 7*  --   AST 10*  --   GLUCOSE 67 94   Lab Results  Component Value Date   CHOL 200 02/08/2016   HDL 65.30 02/08/2016   LDLCALC 115 (H) 02/08/2016   TRIG 97.0 02/08/2016   No results found for: DDIMER  Radiology/Studies:  Dg Chest 2 View  Result Date: 05/10/2016 CLINICAL DATA:  Acute chest pain EXAM: CHEST  2 VIEW COMPARISON:  08/03/2013 FINDINGS: Upper limits normal heart size noted. New interstitial opacities bilaterally are compatible with edema. Trace bilateral pleural effusions noted. No pneumothorax identified. No acute bony abnormalities are present. Right shoulder arthroplasty changes noted. IMPRESSION: Interstitial pulmonary edema with trace bilateral pleural effusions. Upper limits normal heart size. Electronically Signed   By: Margarette Canada M.D.   On: 05/10/2016 21:03    EKG: Interpreted by me  showed: NSR, 87 bpm, PACs, no acute s/t changes Telemetry: Interpreted by me showed: NSR, 70's bpm  Weights: [employer]   05/10/16 2023 05/11/16 0202  Weight: 150 lb (68 kg) 144 lb 4.8 oz (65.5 kg)      Physical Exam: Blood pressure 120/71, pulse 68, temperature 98.1 F (36.7 C), temperature source Oral, resp. rate 18, height 5\' 4"  (1.626 m), weight 144 lb 4.8 oz (65.5 kg), SpO2 96 %. Body mass index is 24.77 kg/m. General: Well developed, well nourished, in no acute distress. Head: Normocephalic, atraumatic, sclera non-icteric, no xanthomas, nares are without discharge.  Neck: Negative for carotid bruits. JVD not elevated. Lungs: Bilateral crackles along the bases. Breathing is unlabored. Heart: RRR with S1 S2. No murmurs, rubs, or gallops appreciated. Abdomen: Soft, non-tender, non-distended with normoactive bowel sounds. No hepatomegaly. No rebound/guarding. No obvious abdominal masses. Msk:  Strength and tone appear normal for age. Extremities: No clubbing or cyanosis. No edema. Distal pedal pulses are 2+ and equal bilaterally. Neuro: Alert and oriented X 3. No facial asymmetry. No focal deficit. Moves all extremities spontaneously. Psych:  Responds to questions appropriately with a normal affect.    Assessment and Plan:  Principal Problem:   Acute respiratory distress (HCC) Active Problems:   Acute on chronic systolic CHF (congestive heart failure) (HCC)   Coronary atherosclerosis   Essential hypertension, benign   Hypokalemia   Hypocalcemia    1. Acute respiratory distress with hypoxia: -Likely 2/2 acute on chronic systolic CHF -BiPAP weaned -Wean O2 as able  2. Acute on chronic systolic CHF/ICM: -Has not started diuresis yet in the setting of severe hypokalemia (possibly the initial potassium was falsely low given multiple low analytes upon admission) -Potassium has improved to 5.1 this morning  -Start IV Lasix 40 mg bid -Check echo -Strict I&O's with daily weights -Start beta blocker when breathing improves -Will need ACEi/ARB or possibly Entresto pending EF on echo  3. Severe hypokalemia: -As above  4. CAD/elevated troponin: -Mildly elevated troponin  likely in the setting of volume overload/CHF exacerbation -Check echo as above -Can pursue nuclear stress testing when breathing better and she has diuresed -Continue aspirin  -Start beta blocker when breathing improves  5. HTN: -Stable -IV diuresis   6. Hyponatremia: -Per IM -Fluid restrict     Signed, Christell Faith, PA-C [facility] Pager: [phone] 05/11/2016, 10:46 AM

## 2016-05-11 NOTE — Care Management (Signed)
Patient active with [facility] for SN only. Agency aware of admission.

## 2016-05-11 NOTE — ED Notes (Signed)
Patient states that she is developing a headache. She states that her pain is a 4 or 5 on the pain scale. RN notified.

## 2016-05-11 NOTE — Care Management Important Message (Signed)
Important Message  Patient Details  Name: Karen Collins MRN: [id number] Date of Birth: 1934-08-13   Medicare Important Message Given:  Yes    Jolly Mango, RN 05/11/2016, 9:09 AM

## 2016-05-12 LAB — BASIC METABOLIC PANEL
Anion gap: 7 (ref 5–15)
BUN: 18 mg/dL (ref 6–20)
CHLORIDE: 99 mmol/L — AB (ref 101–111)
CO2: 22 mmol/L (ref 22–32)
Calcium: 9.1 mg/dL (ref 8.9–10.3)
Creatinine, Ser: 0.93 mg/dL (ref 0.44–1.00)
GFR calc non Af Amer: 57 mL/min — ABNORMAL LOW (ref >60)
Glucose, Bld: 97 mg/dL (ref 65–99)
POTASSIUM: 4.6 mmol/L (ref 3.5–5.1)
SODIUM: 128 mmol/L — AB (ref 135–145)

## 2016-05-12 MED ORDER — FUROSEMIDE 20 MG PO TABS
20.0000 mg | ORAL_TABLET | Freq: Every day | ORAL | Status: DC
  Administered 2016-05-12: 20 mg via ORAL
  Filled 2016-05-12: qty 1

## 2016-05-12 MED ORDER — FUROSEMIDE 20 MG PO TABS: 20.0000 mg | ORAL_TABLET | Freq: Every day | ORAL | 1 refills | Status: DC

## 2016-05-12 MED ORDER — POTASSIUM CHLORIDE CRYS ER 20 MEQ PO TBCR: 20.0000 meq | EXTENDED_RELEASE_TABLET | Freq: Every day | ORAL | 0 refills | Status: DC

## 2016-05-12 MED ORDER — CARVEDILOL 3.125 MG PO TABS: 3.1250 mg | ORAL_TABLET | Freq: Two times a day (BID) | ORAL | 1 refills | Status: DC

## 2016-05-12 NOTE — Care Management Note (Signed)
Case Management Note  Patient Details  Name: KEYONDRA WELCH MRN: [id number] Date of Birth: Dec 09, 1933  Subjective/Objective:     Referral called to Cinda Quest at [facility] [place] to resume home health RN services.                Action/Plan:   Expected Discharge Date:                  Expected Discharge Plan:     In-House Referral:     Discharge planning Services     Post Acute Care Choice:    Choice offered to:     DME Arranged:    DME Agency:     HH Arranged:    HH Agency:     Status of Service:     If discussed at [employer] of Stay Meetings, dates discussed:    Additional Comments:  Lindie Roberson A, RN 05/12/2016, 12:10 PM

## 2016-05-12 NOTE — Evaluation (Signed)
Physical Therapy Evaluation Patient Details Name: Karen Collins MRN: [id number] DOB: [birth year] Today's Date: 05/12/2016   History of Present Illness  82 yo female with onset of LLE instability and cardiac ischemia was admitted and referred to PT to assess her mobility.  PHx:  CHF, hypoxia, EF 35%, previous MI, AAA, CKD 3,   Clinical Impression  After evaluating pt there are specific needs for assistance to go home.  Her family is present and PT clearly stated she needs some family to stay with her to go home safely.  Both sister and pt are stating that she will go home and see how she is doing.  Reiterated the instructions and will hope pt and family arrange the care.  Pt is a SNF level of care otherwise if help is not with her at home 24/7.    Follow Up Recommendations Home health PT;Supervision/Assistance - 24 hour    Equipment Recommendations  Rolling walker with 5" wheels (if pt does not have a functional walker)    Recommendations for Other Services Rehab consult     Precautions / Restrictions Precautions Precautions: Fall (telemetry) Precaution Comments: Pt objects to PT assisting her and advice regarding her fall risk Restrictions Weight Bearing Restrictions: No Other Position/Activity Restrictions: unstable on LLE      Mobility  Bed Mobility Overal bed mobility: Needs Assistance Bed Mobility: Supine to Sit;Sit to Supine     Supine to sit: Min guard;Min assist Sit to supine: Min guard;Min assist   General bed mobility comments: Pt is slow to sit up bedside and needs cues and prompts to sit safely EOB  Transfers Overall transfer level: Needs assistance Equipment used: Rolling walker (2 wheeled);1 person hand held assist Transfers: Sit to/from [employer] Sit to Stand: Min guard;Min assist Stand pivot transfers: Min guard;Min assist       General transfer comment: Pt objects to PT assisting even though with HHA is unsafe and hits obstacles,  door frame with the walker wheel   Ambulation/Gait Ambulation/Gait assistance: Min guard;Min assist Ambulation Distance (Feet): 300 Feet Assistive device: Rolling walker (2 wheeled);1 person hand held assist Gait Pattern/deviations: Step-through pattern;Step-to pattern;Decreased stride length;Decreased weight shift to left;Drifts right/left;Wide base of support (cannot control balance well unless PT holding onto her ) Gait velocity: reduced Gait velocity interpretation: Below normal speed for age/gender General Gait Details: Pt needs assistance with or without the walker and objects to PT directing and holding onto her, asked PT to let go and "you can pick me up if I fall"  Stairs Stairs: Yes Stairs assistance: Min assist Stair Management: One rail Right;Step to pattern;Alternating pattern Number of Stairs: 6 General stair comments: Pt cannot use LLE as her controlling limb to ascend or descend steps, unsafe and upset by PT holding onto her.  Has no railing on her steps at home  Wheelchair Mobility    Modified Rankin (Stroke Patients Only)       Balance Overall balance assessment: Needs assistance Sitting-balance support: Feet supported Sitting balance-Leahy Scale: Fair   Postural control: Posterior lean Standing balance support: Bilateral upper extremity supported Standing balance-Leahy Scale: Poor                               Pertinent Vitals/Pain Pain Assessment: No/denies pain    Home Living Family/patient expects to be discharged to:: Private residence Living Arrangements: Alone Available Help at Discharge: Family;Available PRN/intermittently Type of Home: [place]  Access: Stairs to enter Entrance Stairs-Rails: None Entrance Stairs-Number of Steps: 3 Home Layout: One level Home Equipment: Walker - 2 wheels;Cane - single point Additional Comments: pt states she is not using AD at home and does not need them    Prior Function Level of Independence:  Independent               Hand Dominance        Extremity/Trunk Assessment   Upper Extremity Assessment: Generalized weakness           Lower Extremity Assessment: Generalized weakness      Cervical / Trunk Assessment: Kyphotic  Communication   Communication: No difficulties  Cognition Arousal/Alertness: Awake/alert Behavior During Therapy: Impulsive Overall Cognitive Status: Difficult to assess                      General Comments      Exercises        Assessment/Plan    PT Assessment Patient needs continued PT services  PT Diagnosis Difficulty walking   PT Problem List Decreased range of motion;Decreased strength;Decreased balance;Decreased activity tolerance;Decreased mobility;Decreased coordination;Decreased cognition;Decreased knowledge of use of DME;Decreased safety awareness;Cardiopulmonary status limiting activity;Decreased skin integrity  PT Treatment Interventions DME instruction;Gait training;Stair training;Functional mobility training;Therapeutic activities;Balance training;Therapeutic exercise;Neuromuscular re-education;Patient/family education   PT Goals (Current goals can be found in the Care Plan section) Acute Rehab PT Goals Patient Stated Goal: to leave with no AD PT Goal Formulation: With patient Time For Goal Achievement: 05/26/16 Potential to Achieve Goals: Good    Frequency Min 2X/week   Barriers to discharge Inaccessible home environment;Decreased caregiver support needs mobility assistance to manage at home    Co-evaluation               End of Session Equipment Utilized During Treatment: Gait belt Activity Tolerance: Patient tolerated treatment well;Other (comment) (limited by her inability to use LLE effectively) Patient left: in bed;with call bell/phone within reach;with family/visitor present Nurse Communication: Mobility status;Other (comment) (Pt objections to PT intervention)         Time:  [id number] PT Time Calculation (min) (ACUTE ONLY): 30 min   Charges:   PT Evaluation $PT Eval Moderate Complexity: 1 Procedure PT Treatments $Gait Training: 8-22 mins   PT G Codes:        Ramond Dial 06/03/16, 12:21 PM    Mee Hives, PT MS Acute Rehab Dept. Number: [place]

## 2016-05-12 NOTE — Progress Notes (Signed)
Patient discharged at this time. D/C instructions given, IV taken out, telemetry monitor removed. H.H.C. setup confirmed.

## 2016-05-12 NOTE — Discharge Summary (Signed)
[facility] [place] NAME: Karen Collins    MR#:  [id number]  DATE OF BIRTH:  11-07-1933  DATE OF ADMISSION:  05/10/2016 ADMITTING PHYSICIAN: Lance Coon, MD  DATE OF DISCHARGE: 05/12/16  PRIMARY CARE PHYSICIAN: [name], MD    ADMISSION DIAGNOSIS:  Elevated troponin [R79.89] Chest pain, unspecified chest pain type [R07.9]  DISCHARGE DIAGNOSIS:  Acute systolic CHF Chronic anxiety Dementia  SECONDARY DIAGNOSIS:   Past Medical History:  Diagnosis Date  . AAA (abdominal aortic aneurysm) ([place])    s/p stent   . Anxiety   . Arthritis   . CAD (coronary artery disease)   . Chronic kidney disease (CKD), stage III (moderate)   . Chronic systolic congestive heart failure (HCC)    EF of 35 %   . Depression   . Diverticulitis    H/O  . GERD (gastroesophageal reflux disease)   . GI bleed   . Hx of migraines   . Hx: UTI (urinary tract infection)   . Hyperlipidemia   . Hypertension   . Kidney stones   . MI (myocardial infarction) ([place])    Non-ST elevation MI  . Renal cell carcinoma   . Thyroid disease     HOSPITAL COURSE:  Karen Collins  is a 82 y.o. female who presents with Acute onset shortness of breath. Patient is unable to provide much in way of details on her history and states that she does not remember things very well. She does state that she has a prior history of congestive heart failure, but is unaware if she has ever been on diuretics in the past. She is not currently on diuretics at this time  1.Acute hypoxic respiratory failure. -sats >925 on RA and exertion Received IV lasix. Diuresed well  2.Acute on chronic systolic congestive heart failure. Received IV Lasix, start low-dose Coreg. Continue lisinopril.change to oral lasix with Kcl at home. F/u Dr Rockey Situ and HF clinic as out pt  3.Severe hypokalemia. This has been replaced. Last potassium in the normal range. Patient will need potassium with  Lasix.  4. Anxiety depression continue usual medications  5.Borderline troponin likely demand ischemia with CHF No cp  6.Hyponatremia likely with CHF -improving slowly.   7.Pt is ambulating by herself w/o any issues. She does not use a walker at home  She has been requesting to go home Spoke with pt's sister at length Will resume [place] care CONSULTS OBTAINED:  Treatment Team:  Wellington Hampshire, MD  DRUG ALLERGIES:   Allergies  Allergen Reactions  . Tramadol Rash    DISCHARGE MEDICATIONS:   Current Discharge Medication List    START taking these medications   Details  carvedilol (COREG) 3.125 MG tablet Take 1 tablet (3.125 mg total) by mouth 2 (two) times daily with a meal. Qty: 60 tablet, Refills: 1    furosemide (LASIX) 20 MG tablet Take 1 tablet (20 mg total) by mouth daily. Qty: 30 tablet, Refills: 1    potassium chloride SA (K-DUR,KLOR-CON) 20 MEQ tablet Take 1 tablet (20 mEq total) by mouth daily. Qty: 30 tablet, Refills: 0      CONTINUE these medications which have NOT CHANGED   Details  acetaminophen (TYLENOL) 500 MG tablet Take 325 mg by mouth as needed for mild pain or fever.     aspirin EC 81 MG tablet Take 81 mg by mouth daily.    bisacodyl (DULCOLAX) 5 MG EC tablet Take 5 mg by mouth  as needed.    busPIRone (BUSPAR) 5 MG tablet Take 1 tablet (5 mg total) by mouth 2 (two) times daily. Qty: 60 tablet, Refills: 0    calcium carbonate (CALCIUM 600) 600 MG TABS Take 600 mg by mouth daily.      cetirizine (ZYRTEC) 10 MG tablet Take 10 mg by mouth daily as needed for allergies.     Cholecalciferol (VITAMIN D) 400 UNITS capsule Take 400 Units by mouth daily.    citalopram (CELEXA) 20 MG tablet Take 1 tablet (20 mg total) by mouth daily. Qty: 30 tablet, Refills: 0    docusate sodium (COLACE) 100 MG capsule Take 100 mg by mouth daily as needed.    hyoscyamine (LEVBID) 0.375 MG 12 hr tablet Take 0.375 mg by mouth every 12 (twelve) hours as needed for  cramping.    ibuprofen (ADVIL,MOTRIN) 600 MG tablet Take 600 mg by mouth as needed.    lisinopril (PRINIVIL,ZESTRIL) 5 MG tablet TAKE 1 TABLET BY MOUTH DAILY Qty: 90 tablet, Refills: 4    loperamide (ANTI-DIARRHEAL) 2 MG tablet Take 2 mg by mouth as needed for diarrhea or loose stools.     memantine (NAMENDA XR) 14 MG CP24 24 hr capsule Take 1 capsule (14 mg total) by mouth daily. Qty: 30 capsule, Refills: 0    Multiple Vitamin (MULTIVITAMIN) tablet Take 1 tablet by mouth daily.      mupirocin ointment (BACTROBAN) 2 % Place 1 application into the nose 2 (two) times daily. Qty: 22 g, Refills: 0    nitroGLYCERIN (NITROSTAT) 0.4 MG SL tablet Place 1 tablet (0.4 mg total) under the tongue every 5 (five) minutes as needed. Qty: 30 tablet, Refills: 1    Omega-3 Fatty Acids (FISH OIL) 1200 MG CAPS Take 1,200 mg by mouth daily.    phenylephrine (SUDAFED PE) 10 MG TABS tablet Take 10 mg by mouth as needed (congestion).     simvastatin (ZOCOR) 40 MG tablet TAKE ONE TABLET BY MOUTH EVERY NIGHT AT BEDTIME Qty: 90 tablet, Refills: 2    traZODone (DESYREL) 50 MG tablet Take 50 mg by mouth at bedtime as needed for sleep.        If you experience worsening of your admission symptoms, develop shortness of breath, life threatening emergency, suicidal or homicidal thoughts you must seek medical attention immediately by calling 911 or calling your MD immediately  if symptoms less severe.  You Must read complete instructions/literature along with all the possible adverse reactions/side effects for all the Medicines you take and that have been prescribed to you. Take any new Medicines after you have completely understood and accept all the possible adverse reactions/side effects.   Please note  You were cared for by a hospitalist during your hospital stay. If you have any questions about your discharge medications or the care you received while you were in the hospital after you are discharged, you  can call the unit and asked to speak with the hospitalist on call if the hospitalist that took care of you is not available. Once you are discharged, your primary care physician will handle any further medical issues. Please note that NO REFILLS for any discharge medications will be authorized once you are discharged, as it is imperative that you return to your primary care physician (or establish a relationship with a primary care physician if you do not have one) for your aftercare needs so that they can reassess your need for medications and monitor your lab values. Today  SUBJECTIVE   Doing well. Requesting to go home VITAL SIGNS:  Blood pressure 123/74, pulse 78, temperature 98.4 F (36.9 C), temperature source Oral, resp. rate 20, height 5\' 4"  (1.626 m), weight 63.9 kg (140 lb 14.4 oz), SpO2 96 %.  I/O:   Intake/Output Summary (Last 24 hours) at 05/12/16 1145 Last data filed at 05/12/16 0945  Gross per 24 hour  Intake              243 ml  Output             1400 ml  Net            -1157 ml    PHYSICAL EXAMINATION:  GENERAL:  82 y.o.-year-old patient lying in the bed with no acute distress.  EYES: Pupils equal, round, reactive to light and accommodation. No scleral icterus. Extraocular muscles intact.  HEENT: Head atraumatic, normocephalic. Oropharynx and nasopharynx clear.  NECK:  Supple, no jugular venous distention. No thyroid enlargement, no tenderness.  LUNGS: Normal breath sounds bilaterally, no wheezing, rales,rhonchi or crepitation. No use of accessory muscles of respiration.  CARDIOVASCULAR: S1, S2 normal. No murmurs, rubs, or gallops.  ABDOMEN: Soft, non-tender, non-distended. Bowel sounds present. No organomegaly or mass.  EXTREMITIES: No pedal edema, cyanosis, or clubbing.  NEUROLOGIC: Cranial nerves II through XII are intact. Muscle strength 5/5 in all extremities. Sensation intact. Gait not checked.  PSYCHIATRIC: The patient is alert and oriented x 3.  SKIN: No  obvious rash, lesion, or ulcer.   DATA REVIEW:   CBC   Recent Labs Lab 05/11/16 0918  WBC 4.0  HGB 13.4  HCT 38.7  PLT 192    Chemistries   Recent Labs Lab 05/10/16 2112 05/11/16 0248  05/12/16 0133  NA 135  --   < > 128*  K <2.0*  --   < > 4.6  CL 120*  --   < > 99*  CO2 14*  --   < > 22  GLUCOSE 67  --   < > 97  BUN 9  --   < > 18  CREATININE 0.37*  --   < > 0.93  CALCIUM 4.5*  --   < > 9.1  MG  --  1.9  --   --   AST 10*  --   --   --   ALT 7*  --   --   --   ALKPHOS 34*  --   --   --   BILITOT 0.2*  --   --   --   < > = values in this interval not displayed.  Microbiology Results   No results found for this or any previous visit (from the past 240 hour(s)).  RADIOLOGY:  Dg Chest 2 View  Result Date: 05/10/2016 CLINICAL DATA:  Acute chest pain EXAM: CHEST  2 VIEW COMPARISON:  08/03/2013 FINDINGS: Upper limits normal heart size noted. New interstitial opacities bilaterally are compatible with edema. Trace bilateral pleural effusions noted. No pneumothorax identified. No acute bony abnormalities are present. Right shoulder arthroplasty changes noted. IMPRESSION: Interstitial pulmonary edema with trace bilateral pleural effusions. Upper limits normal heart size. Electronically Signed   By: Margarette Canada M.D.   On: 05/10/2016 21:03     Management plans discussed with the patient, family and they are in agreement.  CODE STATUS:     Code Status Orders        Start     Ordered   05/11/16 0127  Full code  Continuous     05/11/16 0126    Code Status History    Date Active Date Inactive Code Status Order ID Comments User Context   05/11/2016  1:26 AM 05/11/2016 12:49 PM Full Code [id number]  Lance Coon, MD ED    Advance Directive Documentation   [place] Most Recent Value  Type of Advance Directive  Living will  Pre-existing out of facility DNR order (yellow form or pink MOST form)  No data  "MOST" Form in Place?  No data      TOTAL TIME TAKING CARE  OF THIS PATIENT: 40 minutes.    Bradey Luzier M.D on 05/12/2016 at 11:45 AM  Between 7am to 6pm - Pager - [phone] After 6pm go to [email or web address] - password [email or web address] [facility]  Office  [phone]  CC: Primary care physician; [name], MD

## 2016-05-12 NOTE — Discharge Instructions (Signed)
Heart Failure Clinic appointment on May 25, 2016 at 9:00am with [name], [place]. Please call [phone] to reschedule.   Angina Pectoris Angina pectoris, often called angina, is extreme discomfort in the chest, neck, or arm. This is caused by a lack of blood in the middle and thickest layer of the heart wall (myocardium). There are four types of angina:  Stable angina. Stable angina usually occurs in episodes of predictable frequency and duration. It is usually brought on by physical activity, stress, or excitement. Stable angina usually lasts a few minutes and can often be relieved by a medicine that you place under your tongue. This medicine is called sublingual nitroglycerin.  Unstable angina. Unstable angina can occur even when you are doing little or no physical activity. It can even occur while you are sleeping or when you are at rest. It can suddenly increase in severity or frequency. It may not be relieved by sublingual nitroglycerin, and it can last up to 30 minutes.  Microvascular angina. This type of angina is caused by a disorder of tiny blood vessels called arterioles. Microvascular angina is more common in women. The pain may be more severe and last longer than other types of angina pectoris.  Prinzmetal or variant angina. This type of angina pectoris is rare and usually occurs when you are doing little or no physical activity. It especially occurs in the early morning hours. CAUSES Atherosclerosis is the cause of angina. This is the buildup of fat and cholesterol (plaque) on the inside of the arteries. Over time, the plaque may narrow or block the artery, and this will lessen blood flow to the heart. Plaque can also become weak and break off within a coronary artery to form a clot and cause a sudden blockage. RISK FACTORS Risk factors common to both men and women include:  High cholesterol levels.  High blood pressure (hypertension).  Tobacco use.  Diabetes.  Family  history of angina.  Obesity.  Lack of exercise.  A diet high in saturated fats. Women are at greater risk for angina if they are:  Over age 62.  Postmenopausal. SYMPTOMS Many people do not experience any symptoms during the early stages of angina. As the condition progresses, symptoms common to both men and women may include:  Chest pain.  The pain can be described as a crushing or squeezing in the chest, or a tightness, pressure, fullness, or heaviness in the chest.  The pain can last more than a few minutes, or it can stop and recur.  Pain in the arms, neck, jaw, or back.  Unexplained heartburn or indigestion.  Shortness of breath.  Nausea.  Sudden cold sweats.  Sudden light-headedness. Many women have chest discomfort and some of the other symptoms. However, women often have different (atypical) symptoms, such as:   Fatigue.  Unexplained feelings of nervousness or anxiety.  Unexplained weakness.  Dizziness or fainting. Sometimes, women may have angina without any symptoms. DIAGNOSIS  Tests to diagnose angina may include:  ECG (electrocardiogram).  Exercise stress test. This looks for signs of blockage when the heart is being exercised.  Pharmacologic stress test. This test looks for signs of blockage when the heart is being stressed with a medicine.  Blood tests.  Coronary angiogram. This is a procedure to look at the coronary arteries to see if there is any blockage. TREATMENT  The treatment of angina may include the following:  Healthy behavioral changes to reduce or control risk factors.  Medicine.  Coronary stenting.A  stent helps to keep an artery open.  Coronary angioplasty. This procedure widens a narrowed or blocked artery.  Coronary arterybypass surgery. This will allow your blood to pass the blockage (bypass) to reach your heart. HOME CARE INSTRUCTIONS   Take medicines only as directed by your health care provider.  Do not take the  following medicines unless your health care provider approves:  Nonsteroidal anti-inflammatory drugs (NSAIDs), such as ibuprofen, naproxen, or celecoxib.  Vitamin supplements that contain vitamin A, vitamin E, or both.  Hormone replacement therapy that contains estrogen with or without progestin.  Manage other health conditions such as hypertension and diabetes as directed by your health care provider.  Follow a heart-healthy diet. A dietitian can help to educate you about healthy food options and changes.  Use healthy cooking methods such as roasting, grilling, broiling, baking, poaching, steaming, or stir-frying. Talk to a dietitian to learn more about healthy cooking methods.  Follow an exercise program approved by your health care provider.  Maintain a healthy weight. Lose weight as approved by your health care provider.  Plan rest periods when fatigued.  Learn to manage stress.  Do not use any tobacco products, including cigarettes, chewing tobacco, or electronic cigarettes. If you need help quitting, ask your health care provider.  If you drink alcohol, and your health care provider approves, limit your alcohol intake to no more than 1 drink per day. One drink equals 12 ounces of beer, 5 ounces of wine, or 1 ounces of hard liquor.  Stop illegal drug use.  Keep all follow-up visits as directed by your health care provider. This is important. SEEK IMMEDIATE MEDICAL CARE IF:   You have pain in your chest, neck, arm, jaw, stomach, or back that lasts more than a few minutes, is recurring, or is unrelieved by taking sublingualnitroglycerin.  You have profuse sweating without cause.  You have unexplained:  Heartburn or indigestion.  Shortness of breath or difficulty breathing.  Nausea or vomiting.  Fatigue.  Feelings of nervousness or anxiety.  Weakness.  Diarrhea.  You have sudden light-headedness or dizziness.  You faint. These symptoms may represent a serious  problem that is an emergency. Do not wait to see if the symptoms will go away. Get medical help right away. Call your local emergency services (911 in the [place] Do not drive yourself to the hospital.   This information is not intended to replace advice given to you by your health care provider. Make sure you discuss any questions you have with your health care provider.   Document Released: 09/17/2005 Document Revised: 10/08/2014 Document Reviewed: 01/19/2014 Elsevier Interactive Patient Education [employer].

## 2016-05-12 NOTE — Progress Notes (Signed)
Note created in error. Pt not seen.  Karen Collins

## 2016-05-13 DIAGNOSIS — F419 Anxiety disorder, unspecified: Secondary | ICD-10-CM

## 2016-05-13 DIAGNOSIS — I2510 Atherosclerotic heart disease of native coronary artery without angina pectoris: Secondary | ICD-10-CM | POA: Diagnosis not present

## 2016-05-13 DIAGNOSIS — I5022 Chronic systolic (congestive) heart failure: Secondary | ICD-10-CM | POA: Diagnosis not present

## 2016-05-13 DIAGNOSIS — F0391 Unspecified dementia with behavioral disturbance: Secondary | ICD-10-CM

## 2016-05-13 DIAGNOSIS — Z7982 Long term (current) use of aspirin: Secondary | ICD-10-CM

## 2016-05-13 DIAGNOSIS — I130 Hypertensive heart and chronic kidney disease with heart failure and stage 1 through stage 4 chronic kidney disease, or unspecified chronic kidney disease: Secondary | ICD-10-CM | POA: Diagnosis not present

## 2016-05-13 DIAGNOSIS — N183 Chronic kidney disease, stage 3 (moderate): Secondary | ICD-10-CM | POA: Diagnosis not present

## 2016-05-13 DIAGNOSIS — I10 Essential (primary) hypertension: Secondary | ICD-10-CM | POA: Diagnosis not present

## 2016-05-13 DIAGNOSIS — F329 Major depressive disorder, single episode, unspecified: Secondary | ICD-10-CM | POA: Diagnosis not present

## 2016-05-14 ENCOUNTER — Telehealth: Payer: Self-pay | Admitting: Internal Medicine

## 2016-05-14 ENCOUNTER — Other Ambulatory Visit: Payer: Self-pay | Admitting: Psychiatry

## 2016-05-14 NOTE — Telephone Encounter (Signed)
HFU, Pt was discharged on 05/12/16. Dx was Acute incident of CHF. No appt avail to sch. Let me know where to sch? Thank you!  Call sister pt @ [phone] or cell [phone].

## 2016-05-15 DIAGNOSIS — F419 Anxiety disorder, unspecified: Secondary | ICD-10-CM | POA: Diagnosis not present

## 2016-05-15 DIAGNOSIS — F0391 Unspecified dementia with behavioral disturbance: Secondary | ICD-10-CM | POA: Diagnosis not present

## 2016-05-15 DIAGNOSIS — I10 Essential (primary) hypertension: Secondary | ICD-10-CM | POA: Diagnosis not present

## 2016-05-15 DIAGNOSIS — I2510 Atherosclerotic heart disease of native coronary artery without angina pectoris: Secondary | ICD-10-CM | POA: Diagnosis not present

## 2016-05-15 DIAGNOSIS — I5022 Chronic systolic (congestive) heart failure: Secondary | ICD-10-CM | POA: Diagnosis not present

## 2016-05-15 DIAGNOSIS — Z7982 Long term (current) use of aspirin: Secondary | ICD-10-CM | POA: Diagnosis not present

## 2016-05-15 DIAGNOSIS — N183 Chronic kidney disease, stage 3 (moderate): Secondary | ICD-10-CM | POA: Diagnosis not present

## 2016-05-15 DIAGNOSIS — F329 Major depressive disorder, single episode, unspecified: Secondary | ICD-10-CM | POA: Diagnosis not present

## 2016-05-15 DIAGNOSIS — I130 Hypertensive heart and chronic kidney disease with heart failure and stage 1 through stage 4 chronic kidney disease, or unspecified chronic kidney disease: Secondary | ICD-10-CM | POA: Diagnosis not present

## 2016-05-15 NOTE — Telephone Encounter (Signed)
Spoke with Sister Inez Catalina) and she confirmed hospital follow up appointment has been scheduled with the Heart Failure Clinic at [facility].  She was advised [name] would be notified from there and continue to follow.  [facility] is currently in progress and are making home visits every Tuesday and Thursday. Now attempting to secure assisted living.  No appointment scheduled with PCP at this time.  Encouraged to call PCP as needed.

## 2016-05-15 NOTE — Telephone Encounter (Signed)
I do not mind seeing pt.  Her discharge summary states needs f/u with Dr Rockey Situ and heart failure clinic.  Please make sure she has appt scheduled with cardiology.  She was admitted with CHF.  This may save her two visits.  Will need to talk with sister Inez Catalina).

## 2016-05-15 NOTE — Telephone Encounter (Signed)
Just to confirm, so she does not need to come in or need anything more at this time.  Tell them to let us know if needs anything.

## 2016-05-15 NOTE — Telephone Encounter (Signed)
Sister states patient does not need to come in for anything specific and will notify PCP if anything changes, any condition worsens or needs to be seen.

## 2016-05-16 ENCOUNTER — Telehealth: Payer: Self-pay | Admitting: Internal Medicine

## 2016-05-16 NOTE — Telephone Encounter (Signed)
Noted, thanks!

## 2016-05-16 NOTE — Telephone Encounter (Signed)
They need to call the office to ask for that request she we did not prescribe those medications. I will forward this note to their office if it will help.

## 2016-05-16 NOTE — Telephone Encounter (Signed)
Pt sister states that pt medication got lost after being Discharged form Hospital. Patient sister would like someone from our to call Dr. Anola Gurney office to see if they will refill because they havent got a call back.. Please advise  Medications lost are.... busPIRone (BUSPAR) 5 MG tablet citalopram (CELEXA) 20 MG tablet

## 2016-05-16 NOTE — Telephone Encounter (Signed)
Per a review of chart, I do not see where they have requested these medications today from Dr. Gretel Acre

## 2016-05-16 NOTE — Telephone Encounter (Signed)
Please advise, did they speak with you regarding these concerns today? I know she was just discharged.  thanks

## 2016-05-16 NOTE — Telephone Encounter (Signed)
Tanya- not necessary for transitional care management when call was placed yesterday.  Patient is not being seen for HFU in our office.  No additional concerns were expressed.  Encouraged to follow up with PCP as needed.

## 2016-05-18 ENCOUNTER — Telehealth: Payer: Self-pay | Admitting: *Deleted

## 2016-05-18 NOTE — Telephone Encounter (Signed)
Karen Collins from [facility] called and states she is trying to fill out a FL2 and she needs help with the diagnoses codes . The diagnoses that she has is not good ones to get her admit to a Assistant living facility.  She is requesting a call back. So she can have majority of the FL 2 filled out before they come to see Dr. Nicki Collins for signature .  She is meeting with family on Monday. She is requesting a call back . Her call back number is [phone]. Thanks

## 2016-05-21 DIAGNOSIS — I5022 Chronic systolic (congestive) heart failure: Secondary | ICD-10-CM | POA: Diagnosis not present

## 2016-05-21 DIAGNOSIS — I130 Hypertensive heart and chronic kidney disease with heart failure and stage 1 through stage 4 chronic kidney disease, or unspecified chronic kidney disease: Secondary | ICD-10-CM | POA: Diagnosis not present

## 2016-05-21 DIAGNOSIS — F329 Major depressive disorder, single episode, unspecified: Secondary | ICD-10-CM | POA: Diagnosis not present

## 2016-05-21 DIAGNOSIS — F419 Anxiety disorder, unspecified: Secondary | ICD-10-CM | POA: Diagnosis not present

## 2016-05-21 DIAGNOSIS — F0391 Unspecified dementia with behavioral disturbance: Secondary | ICD-10-CM | POA: Diagnosis not present

## 2016-05-21 DIAGNOSIS — I2510 Atherosclerotic heart disease of native coronary artery without angina pectoris: Secondary | ICD-10-CM | POA: Diagnosis not present

## 2016-05-21 DIAGNOSIS — Z7982 Long term (current) use of aspirin: Secondary | ICD-10-CM | POA: Diagnosis not present

## 2016-05-21 DIAGNOSIS — N183 Chronic kidney disease, stage 3 (moderate): Secondary | ICD-10-CM | POA: Diagnosis not present

## 2016-05-21 DIAGNOSIS — I10 Essential (primary) hypertension: Secondary | ICD-10-CM | POA: Diagnosis not present

## 2016-05-22 ENCOUNTER — Encounter: Payer: Self-pay | Admitting: Psychiatry

## 2016-05-22 ENCOUNTER — Ambulatory Visit (INDEPENDENT_AMBULATORY_CARE_PROVIDER_SITE_OTHER): Payer: PPO | Admitting: Psychiatry

## 2016-05-22 VITALS — BP 124/75 | HR 78 | Temp 97.8°F | Ht 64.0 in | Wt 141.4 lb

## 2016-05-22 DIAGNOSIS — F0391 Unspecified dementia with behavioral disturbance: Secondary | ICD-10-CM

## 2016-05-22 DIAGNOSIS — F331 Major depressive disorder, recurrent, moderate: Secondary | ICD-10-CM

## 2016-05-22 DIAGNOSIS — F03918 Unspecified dementia, unspecified severity, with other behavioral disturbance: Secondary | ICD-10-CM

## 2016-05-22 DIAGNOSIS — F411 Generalized anxiety disorder: Secondary | ICD-10-CM | POA: Diagnosis not present

## 2016-05-22 MED ORDER — MEMANTINE HCL ER 14 MG PO CP24: 14.0000 mg | ORAL_CAPSULE | Freq: Every day | ORAL | 1 refills | Status: DC

## 2016-05-22 MED ORDER — CITALOPRAM HYDROBROMIDE 20 MG PO TABS: 20.0000 mg | ORAL_TABLET | Freq: Every day | ORAL | 1 refills | Status: DC

## 2016-05-22 MED ORDER — BUSPIRONE HCL 5 MG PO TABS: 5.0000 mg | ORAL_TABLET | Freq: Two times a day (BID) | ORAL | 1 refills | Status: DC

## 2016-05-22 MED ORDER — TRAZODONE HCL 50 MG PO TABS: 50.0000 mg | ORAL_TABLET | Freq: Every evening | ORAL | 1 refills | Status: DC | PRN

## 2016-05-22 NOTE — Progress Notes (Signed)
BH MD/PA/NP OP Progress Note  05/22/2016 10:41 AM CAPPIE GLOCKNER  MRN:  [id number]  Subjective:    Pt is a 82 yo female who presented for follow up appointment accompanied by her sister. She was admitted to the inpatient hospital due to congestive heart failure and feeling weak. Her chest x-ray showed significant pulmonary edema and her BNP was significantly elevated. She was not taking any diuretics at home. She was discharged on August 12 and since then she has been followed by and [facility] home [facility]. Her sister reported that after she got home she found out that her psychiatric dictations were lost. They could not find the bottles of the medications which are prescribed at the last appointment. Patient has been out of her Celexa BuSpar and Namenda. Patient is not taking the medications since then. She reported during this interview that she has been feeling anxious and is not feeling well. She was focused on her anxiety during this interview. Her sister reported that she has been monitored closely by the home health nurse who comes to evaluate her on a regular basis. There are also talking about placing her in a assisted living facility.  They are pleased with her current medications. She is responding well to the BuSpar at this time. She currently denied having any suicidal ideations or plans.  We discussed about her medications in detail. I will refill her medications for the next 2 months. Her sister remains supportive. She will have an appointment with the cardiologist next week.    Chief Complaint:   Visit Diagnosis:     ICD-9-CM ICD-10-CM   1. MDD (major depressive disorder), recurrent episode, moderate (HCC) 296.32 F33.1   2. Anxiety state 300.00 F41.1   3. Dementia with behavioral disturbance 294.21 F03.91     Past Medical History:  Past Medical History:  Diagnosis Date  . AAA (abdominal aortic aneurysm) ([place])    s/p stent   . Anxiety   . Arthritis   . CAD (coronary  artery disease)   . Chronic kidney disease (CKD), stage III (moderate)   . Chronic systolic congestive heart failure (HCC)    EF of 35 %   . Depression   . Diverticulitis    H/O  . GERD (gastroesophageal reflux disease)   . GI bleed   . Hx of migraines   . Hx: UTI (urinary tract infection)   . Hyperlipidemia   . Hypertension   . Kidney stones   . MI (myocardial infarction) ([place])    Non-ST elevation MI  . Renal cell carcinoma   . Thyroid disease     Past Surgical History:  Procedure Laterality Date  . ABDOMINAL AORTIC ANEURYSM REPAIR     s/p stent  . ABDOMINAL AORTIC ENDOVASCULAR STENT GRAFT    . CARDIAC CATHETERIZATION    . CARDIAC CATHETERIZATION  09/18/2010  . CHOLECYSTECTOMY    . CORONARY STENT PLACEMENT  09/18/2010   CAD; MI s/p stent  . LAPAROSCOPIC PARTIAL NEPHRECTOMY  2010  . NEPHRECTOMY  10/2008   Right  . PARATHYROIDECTOMY    . SHOULDER SURGERY  07/2010   Right  . THYROIDECTOMY     Family History:  Family History  Problem Relation Age of Onset  . Arthritis Mother   . Heart disease Mother   . Pulmonary fibrosis Mother   . Heart disease Father   . Hodgkin's lymphoma Father   . Breast cancer Sister   . Hyperlipidemia Sister    Social History:  Social History   Social History  . Marital status: Divorced    Spouse name: N/A  . Number of children: N/A  . Years of education: N/A   Occupational History  . Retired Retired   Social History Main Topics  . Smoking status: Former Smoker    Packs/day: 1.00    Years: 30.00    Quit date: 10/01/2005  . Smokeless tobacco: Never Used  . Alcohol use 4.2 oz/week    4 Glasses of wine, 3 Standard drinks or equivalent per week     Comment: Wine 2-3 times weekly  . Drug use: No  . Sexual activity: No   Other Topics Concern  . Not on file   Social History Narrative   Married   Walks occasionally with house work   Additional History: Lives alone .  Assessment:   Musculoskeletal: Strength & Muscle Tone:  within normal limits Gait & Station: normal Patient leans: N/A  Psychiatric Specialty Exam: Medication Refill  Pertinent negatives include no fever or vomiting.  Anxiety  Symptoms include nervous/anxious behavior. Patient reports no suicidal ideas.      Review of Systems  Constitutional: Negative for fever and weight loss.  HENT: Negative for hearing loss and nosebleeds.   Eyes: Negative for photophobia.  Respiratory: Negative for sputum production.   Cardiovascular: Positive for orthopnea.  Gastrointestinal: Negative for diarrhea and vomiting.  Genitourinary: Negative for hematuria.  Neurological: Positive for sensory change. Negative for tingling.  Endo/Heme/Allergies: Negative for environmental allergies.  Psychiatric/Behavioral: Positive for depression. Negative for hallucinations and suicidal ideas. The patient is nervous/anxious.     There were no vitals taken for this visit.There is no height or weight on file to calculate BMI.  General Appearance: Fairly Groomed  Eye Contact:  good  Speech:  Normal Rate  Volume:  Normal   Mood:  Anxious  Affect:  Congruent  Thought Process:  Logical  Orientation:  Full (Time, Place, and Person)  Thought Content:  WDL  Suicidal Thoughts:  No  Homicidal Thoughts:  No  Memory:  Immediate;   Fair Recent;   Fair  Judgement:  Fair  Insight:  Fair  Psychomotor Activity:  Normal  Concentration:  Fair  Recall:  [employer] of Knowledge: Fair  Language: Fair  Akathisia:  No  Handed:  Right  AIMS (if indicated):  none  Assets:  Communication Skills Desire for Improvement Physical Health  ADL's:  Intact  Cognition: WNL  Sleep:  6-7    Is the patient at risk to self?  No. Has the patient been a risk to self in the past 6 months?  No. Has the patient been a risk to self within the distant past?  No. Is the patient a risk to others?  No. Has the patient been a risk to others in the past 6 months?  No. Has the patient been a risk to  others within the distant past?  No.  Current Medications: Current Outpatient Prescriptions  Medication Sig Dispense Refill  . acetaminophen (TYLENOL) 500 MG tablet Take 325 mg by mouth as needed for mild pain or fever.     Marland Kitchen aspirin EC 81 MG tablet Take 81 mg by mouth daily.    . bisacodyl (DULCOLAX) 5 MG EC tablet Take 5 mg by mouth as needed.    . busPIRone (BUSPAR) 5 MG tablet Take 1 tablet (5 mg total) by mouth 2 (two) times daily. 60 tablet 1  . calcium carbonate (CALCIUM 600) 600 MG  TABS Take 600 mg by mouth daily.      . carvedilol (COREG) 3.125 MG tablet Take 1 tablet (3.125 mg total) by mouth 2 (two) times daily with a meal. 60 tablet 1  . cetirizine (ZYRTEC) 10 MG tablet Take 10 mg by mouth daily as needed for allergies.     . Cholecalciferol (VITAMIN D) 400 UNITS capsule Take 400 Units by mouth daily.    . citalopram (CELEXA) 20 MG tablet Take 1 tablet (20 mg total) by mouth daily. 30 tablet 1  . docusate sodium (COLACE) 100 MG capsule Take 100 mg by mouth daily as needed.    . furosemide (LASIX) 20 MG tablet Take 1 tablet (20 mg total) by mouth daily. 30 tablet 1  . hyoscyamine (LEVBID) 0.375 MG 12 hr tablet Take 0.375 mg by mouth every 12 (twelve) hours as needed for cramping.    Marland Kitchen ibuprofen (ADVIL,MOTRIN) 600 MG tablet Take 600 mg by mouth as needed.    Marland Kitchen lisinopril (PRINIVIL,ZESTRIL) 5 MG tablet TAKE 1 TABLET BY MOUTH DAILY 90 tablet 4  . loperamide (ANTI-DIARRHEAL) 2 MG tablet Take 2 mg by mouth as needed for diarrhea or loose stools.     . memantine (NAMENDA XR) 14 MG CP24 24 hr capsule Take 1 capsule (14 mg total) by mouth daily. 30 capsule 1  . Multiple Vitamin (MULTIVITAMIN) tablet Take 1 tablet by mouth daily.      . mupirocin ointment (BACTROBAN) 2 % Place 1 application into the nose 2 (two) times daily. 22 g 0  . nitroGLYCERIN (NITROSTAT) 0.4 MG SL tablet Place 1 tablet (0.4 mg total) under the tongue every 5 (five) minutes as needed. 30 tablet 1  . Omega-3 Fatty Acids  (FISH OIL) 1200 MG CAPS Take 1,200 mg by mouth daily.    . phenylephrine (SUDAFED PE) 10 MG TABS tablet Take 10 mg by mouth as needed (congestion).     . potassium chloride SA (K-DUR,KLOR-CON) 20 MEQ tablet Take 1 tablet (20 mEq total) by mouth daily. 30 tablet 0  . simvastatin (ZOCOR) 40 MG tablet TAKE ONE TABLET BY MOUTH EVERY NIGHT AT BEDTIME 90 tablet 2  . traZODone (DESYREL) 50 MG tablet Take 1 tablet (50 mg total) by mouth at bedtime as needed for sleep. 30 tablet 1   No current facility-administered medications for this visit.     Medical Decision Making:  Established Problem, Stable/Improving (1)  Treatment Plan Summary:Medication management   Memory impairment Continue  Namenda XR 14 mg by mouth daily.  Depression Continue  Celexa 20 mg by mouth daily  Continue  BuSpar 5 mg by mouth twice a day for her anxiety and depression   Discussed with patient her sister at length about the medications treatment risks benefits and alternatives. Advised her to stop drinking alcohol and she agreed with the plan.  Follow-up Advised patient to follow up in 4 weeks  or earlier depending on her symptoms    More than 50% of the time spent in psychoeducation, counseling and coordination of care related to her anxiety and memory  Time spent with the patient 25 minutes   This note was generated in part or whole with voice recognition software. Voice regonition is usually quite accurate but there are transcription errors that can and very often do occur. I apologize for any typographical errors that were not detected and corrected.     Rainey Pines, MD  05/22/2016, 10:41 AM

## 2016-05-22 NOTE — Telephone Encounter (Signed)
Karen Collins has been informed.

## 2016-05-25 ENCOUNTER — Encounter: Payer: Self-pay | Admitting: Family

## 2016-05-25 ENCOUNTER — Ambulatory Visit: Payer: PPO | Attending: Family | Admitting: Family

## 2016-05-25 VITALS — BP 104/82 | HR 91 | Resp 18 | Ht 64.0 in | Wt 138.0 lb

## 2016-05-25 DIAGNOSIS — R109 Unspecified abdominal pain: Secondary | ICD-10-CM | POA: Diagnosis not present

## 2016-05-25 DIAGNOSIS — E890 Postprocedural hypothyroidism: Secondary | ICD-10-CM | POA: Diagnosis not present

## 2016-05-25 DIAGNOSIS — Z807 Family history of other malignant neoplasms of lymphoid, hematopoietic and related tissues: Secondary | ICD-10-CM | POA: Insufficient documentation

## 2016-05-25 DIAGNOSIS — Z87442 Personal history of urinary calculi: Secondary | ICD-10-CM | POA: Insufficient documentation

## 2016-05-25 DIAGNOSIS — Z905 Acquired absence of kidney: Secondary | ICD-10-CM | POA: Insufficient documentation

## 2016-05-25 DIAGNOSIS — I6523 Occlusion and stenosis of bilateral carotid arteries: Secondary | ICD-10-CM | POA: Diagnosis not present

## 2016-05-25 DIAGNOSIS — E785 Hyperlipidemia, unspecified: Secondary | ICD-10-CM | POA: Insufficient documentation

## 2016-05-25 DIAGNOSIS — I10 Essential (primary) hypertension: Secondary | ICD-10-CM | POA: Diagnosis not present

## 2016-05-25 DIAGNOSIS — K219 Gastro-esophageal reflux disease without esophagitis: Secondary | ICD-10-CM | POA: Diagnosis not present

## 2016-05-25 DIAGNOSIS — I130 Hypertensive heart and chronic kidney disease with heart failure and stage 1 through stage 4 chronic kidney disease, or unspecified chronic kidney disease: Secondary | ICD-10-CM | POA: Insufficient documentation

## 2016-05-25 DIAGNOSIS — Z7982 Long term (current) use of aspirin: Secondary | ICD-10-CM | POA: Diagnosis not present

## 2016-05-25 DIAGNOSIS — I5022 Chronic systolic (congestive) heart failure: Secondary | ICD-10-CM | POA: Diagnosis not present

## 2016-05-25 DIAGNOSIS — Z955 Presence of coronary angioplasty implant and graft: Secondary | ICD-10-CM | POA: Diagnosis not present

## 2016-05-25 DIAGNOSIS — I714 Abdominal aortic aneurysm, without rupture: Secondary | ICD-10-CM | POA: Diagnosis not present

## 2016-05-25 DIAGNOSIS — Z8249 Family history of ischemic heart disease and other diseases of the circulatory system: Secondary | ICD-10-CM | POA: Insufficient documentation

## 2016-05-25 DIAGNOSIS — I2510 Atherosclerotic heart disease of native coronary artery without angina pectoris: Secondary | ICD-10-CM | POA: Insufficient documentation

## 2016-05-25 DIAGNOSIS — Z87891 Personal history of nicotine dependence: Secondary | ICD-10-CM | POA: Insufficient documentation

## 2016-05-25 DIAGNOSIS — I252 Old myocardial infarction: Secondary | ICD-10-CM | POA: Insufficient documentation

## 2016-05-25 DIAGNOSIS — Z888 Allergy status to other drugs, medicaments and biological substances status: Secondary | ICD-10-CM | POA: Insufficient documentation

## 2016-05-25 DIAGNOSIS — Z9049 Acquired absence of other specified parts of digestive tract: Secondary | ICD-10-CM | POA: Diagnosis not present

## 2016-05-25 DIAGNOSIS — N183 Chronic kidney disease, stage 3 (moderate): Secondary | ICD-10-CM | POA: Diagnosis not present

## 2016-05-25 DIAGNOSIS — Z95828 Presence of other vascular implants and grafts: Secondary | ICD-10-CM | POA: Insufficient documentation

## 2016-05-25 DIAGNOSIS — F419 Anxiety disorder, unspecified: Secondary | ICD-10-CM | POA: Diagnosis not present

## 2016-05-25 DIAGNOSIS — Z803 Family history of malignant neoplasm of breast: Secondary | ICD-10-CM | POA: Insufficient documentation

## 2016-05-25 DIAGNOSIS — Z85528 Personal history of other malignant neoplasm of kidney: Secondary | ICD-10-CM | POA: Insufficient documentation

## 2016-05-25 DIAGNOSIS — E079 Disorder of thyroid, unspecified: Secondary | ICD-10-CM | POA: Diagnosis not present

## 2016-05-25 MED ORDER — NITROGLYCERIN 0.4 MG SL SUBL: 0.4000 mg | SUBLINGUAL_TABLET | SUBLINGUAL | 1 refills | Status: DC | PRN

## 2016-05-25 NOTE — Patient Instructions (Signed)
Continue weighing daily and call for an overnight weight gain of > 2 pounds or a weekly weight gain of >5 pounds. 

## 2016-05-25 NOTE — Progress Notes (Signed)
Subjective:    Patient ID: Karen Collins, female    DOB: 02/26/1934, 82 y.o.   MRN: [id number]  Congestive Heart Failure  Presents for initial visit. The disease course has been improving. Associated symptoms include fatigue, palpitations and shortness of breath. Pertinent negatives include no abdominal pain, chest pain, edema or orthopnea. The symptoms have been improving. Past treatments include beta blockers, ACE inhibitors and salt and fluid restriction. The treatment provided moderate relief. Compliance with prior treatments has been variable. Prior compliance problems include difficulty understanding directions ("memory issues"). Her past medical history is significant for CAD and HTN. There is no history of CVA or DM. She has multiple 1st degree relatives with heart disease.  Hypertension  This is a chronic problem. The current episode started more than 1 year ago. The problem is unchanged. The problem is controlled. Associated symptoms include anxiety, palpitations and shortness of breath. Pertinent negatives include no chest pain, neck pain or peripheral edema. There are no associated agents to hypertension. Risk factors for coronary artery disease include post-menopausal state, family history and smoking/tobacco exposure. Past treatments include ACE inhibitors, beta blockers, diuretics and lifestyle changes. The current treatment provides significant improvement. Compliance problems include psychosocial issues.  Hypertensive end-organ damage includes CAD/MI and heart failure.   Past Medical History:  Diagnosis Date  . AAA (abdominal aortic aneurysm) ([place])    s/p stent   . Anxiety   . Arthritis   . CAD (coronary artery disease)   . Chronic kidney disease (CKD), stage III (moderate)   . Chronic systolic congestive heart failure (HCC)    EF of 35 %   . Depression   . Diverticulitis    H/O  . GERD (gastroesophageal reflux disease)   . GI bleed   . Hx of migraines   . Hx: UTI  (urinary tract infection)   . Hyperlipidemia   . Hypertension   . Kidney stones   . MI (myocardial infarction) ([place])    Non-ST elevation MI  . Renal cell carcinoma   . Thyroid disease     Past Surgical History:  Procedure Laterality Date  . ABDOMINAL AORTIC ANEURYSM REPAIR     s/p stent  . ABDOMINAL AORTIC ENDOVASCULAR STENT GRAFT    . CARDIAC CATHETERIZATION    . CARDIAC CATHETERIZATION  09/18/2010  . CHOLECYSTECTOMY    . CORONARY STENT PLACEMENT  09/18/2010   CAD; MI s/p stent  . LAPAROSCOPIC PARTIAL NEPHRECTOMY  2010  . NEPHRECTOMY  10/2008   Right  . PARATHYROIDECTOMY    . SHOULDER SURGERY  07/2010   Right  . THYROIDECTOMY      Family History  Problem Relation Age of Onset  . Arthritis Mother   . Heart disease Mother   . Pulmonary fibrosis Mother   . Heart disease Father   . Hodgkin's lymphoma Father   . Breast cancer Sister   . Hyperlipidemia Sister     Social History  Substance Use Topics  . Smoking status: Former Smoker    Packs/day: 1.00    Years: 30.00    Quit date: 10/01/2005  . Smokeless tobacco: Never Used  . Alcohol use 4.2 oz/week    4 Glasses of wine, 3 Standard drinks or equivalent per week     Comment: Wine 2-3 times weekly    Allergies  Allergen Reactions  . Tramadol Rash    Prior to Admission medications   Medication Sig Start Date End Date Taking? Authorizing Provider  acetaminophen (TYLENOL) 500  MG tablet Take 325 mg by mouth as needed for mild pain or fever.    Yes Historical Provider, MD  aspirin EC 81 MG tablet Take 81 mg by mouth daily.   Yes Historical Provider, MD  bisacodyl (DULCOLAX) 5 MG EC tablet Take 5 mg by mouth as needed.   Yes Historical Provider, MD  busPIRone (BUSPAR) 5 MG tablet Take 1 tablet (5 mg total) by mouth 2 (two) times daily. 05/22/16  Yes Rainey Pines, MD  calcium carbonate (CALCIUM 600) 600 MG TABS Take 600 mg by mouth daily.     Yes Historical Provider, MD  carvedilol (COREG) 3.125 MG tablet Take 1 tablet  (3.125 mg total) by mouth 2 (two) times daily with a meal. 05/12/16  Yes Fritzi Mandes, MD  cetirizine (ZYRTEC) 10 MG tablet Take 10 mg by mouth daily as needed for allergies.    Yes Historical Provider, MD  Cholecalciferol (VITAMIN D) 400 UNITS capsule Take 400 Units by mouth daily.   Yes Historical Provider, MD  citalopram (CELEXA) 20 MG tablet Take 1 tablet (20 mg total) by mouth daily. 05/22/16  Yes Rainey Pines, MD  furosemide (LASIX) 20 MG tablet Take 1 tablet (20 mg total) by mouth daily. 05/12/16  Yes Fritzi Mandes, MD  ibuprofen (ADVIL,MOTRIN) 600 MG tablet Take 600 mg by mouth as needed.   Yes Historical Provider, MD  lisinopril (PRINIVIL,ZESTRIL) 5 MG tablet TAKE 1 TABLET BY MOUTH DAILY 06/23/15  Yes [name], MD  memantine (NAMENDA XR) 14 MG CP24 24 hr capsule Take 1 capsule (14 mg total) by mouth daily. 05/22/16  Yes Rainey Pines, MD  Multiple Vitamin (MULTIVITAMIN) tablet Take 1 tablet by mouth daily.     Yes Historical Provider, MD  nitroGLYCERIN (NITROSTAT) 0.4 MG SL tablet Place 1 tablet (0.4 mg total) under the tongue every 5 (five) minutes as needed. 05/25/16  Yes Alisa Graff, FNP  Omega-3 Fatty Acids (FISH OIL) 1200 MG CAPS Take 1,200 mg by mouth daily.   Yes Historical Provider, MD  potassium chloride SA (K-DUR,KLOR-CON) 20 MEQ tablet Take 1 tablet (20 mEq total) by mouth daily. 05/12/16  Yes Fritzi Mandes, MD  simvastatin (ZOCOR) 40 MG tablet TAKE ONE TABLET BY MOUTH EVERY NIGHT AT BEDTIME 11/24/15  Yes [name], MD  traZODone (DESYREL) 50 MG tablet Take 1 tablet (50 mg total) by mouth at bedtime as needed for sleep. 05/22/16  Yes Rainey Pines, MD  loperamide (ANTI-DIARRHEAL) 2 MG tablet Take 2 mg by mouth as needed for diarrhea or loose stools.     Historical Provider, MD      Review of Systems  Constitutional: Positive for fatigue. Negative for appetite change.  HENT: Negative for congestion, postnasal drip and sore throat.   Eyes: Negative.   Respiratory: Positive for chest  tightness ("at times") and shortness of breath. Negative for cough.   Cardiovascular: Positive for palpitations. Negative for chest pain and leg swelling.  Gastrointestinal: Negative for abdominal distention and abdominal pain.  Endocrine: Negative.   Genitourinary: Negative.   Musculoskeletal: Negative for back pain and neck pain.  Skin: Negative.   Allergic/Immunologic: Negative.   Neurological: Negative for dizziness and light-headedness.       Memory issues  Hematological: Negative for adenopathy. Does not bruise/bleed easily.  Psychiatric/Behavioral: Positive for decreased concentration. Negative for dysphoric mood and sleep disturbance (sleeping on 1 pillow). The patient is nervous/anxious.        Objective:   Physical Exam  Constitutional: She is oriented  to person, place, and time. She appears well-developed and well-nourished.  HENT:  Head: Normocephalic and atraumatic.  Eyes: Conjunctivae are normal. Pupils are equal, round, and reactive to light.  Neck: Normal range of motion. Neck supple.  Cardiovascular: Regular rhythm.  Tachycardia present.   Pulmonary/Chest: Effort normal. She has no wheezes. She has no rales.  Abdominal: Soft. She exhibits no distension. There is no tenderness.  Musculoskeletal: She exhibits no edema or tenderness.  Neurological: She is alert and oriented to person, place, and time.  Skin: Skin is warm and dry.  Psychiatric: Her behavior is normal. Thought content normal. Her mood appears anxious. She does not exhibit a depressed mood.  Nursing note and vitals reviewed.   BP 104/82   Pulse 91   Resp 18   Ht 5\' 4"  (1.626 m)   Wt 138 lb (62.6 kg)   SpO2 99%   BMI 23.69 kg/m        Assessment & Plan:  1: Chronic heart failure with reduced ejection fraction- Patient presents with fatigue and shortness of breath with moderate exertion (Class II). Symptoms quickly improve upon rest. She denies any swelling in her legs or abdomen. She is already  weighing herself daily and says that her weight has remained around 138 pounds. Discussed the importance of calling for an overnight weight gain of >2 pounds or a weekly weight gain of >5 pounds. She is not adding salt to her food and says that she doesn't eat much canned food. Discussed the importance of closely following a 2000mg  sodium diet and written dietary information was also given to her. Does have home health that is coming out until the end of August. Discussed possibly increasing her carvedilol and could also consider changing her lisinopril to entresto. Has not seen cardiology since discharge.  2: HTN- Blood pressure looks good today. Continue medications at this time.  3: Anxiety- Patient admits that she struggles with anxiety and is feeling a little anxious today. Heart rate slightly elevated which could be related to her anxiety. She follows closely with psychiatry.   Medication list was reviewed. Patient admits that due to her memory issues, she sometimes has difficulty with taking her medications. Sisters that are present in the room say that they are working with someone about a better way for her to take her medications.   Return here in 1 month or sooner for any questions/problems before then.

## 2016-05-28 ENCOUNTER — Other Ambulatory Visit: Payer: Self-pay | Admitting: *Deleted

## 2016-05-28 NOTE — Patient Outreach (Addendum)
[facility]) Care Management  05/28/2016  WYLDA COSGROVE 03-May-1934 [id number]   Subjective: Telephone call to patient's home number times 2, spoke with patient, and HIPAA verified.   Patient states she is doing well and has had a good day.  States she walked around the block today, went grocery shopping, cooked her meals, is balancing checkbook, and writing out bills.  Patient states she does not want RNCM to speak with sisters Caesar Chestnut and Theophilus Kinds)  regarding her healthcare at this time.  Discussed [facility] Care Management services, patient voices understanding, and is in agreement to services.  Patient states today she is not interested in pursuing assisted living facility but may change in the future.  States she is receiving home health through [facility].  States she is very independent.  Patient states she does not have any, transportation, [employer], or pharmacy needs at this time.   Patient in agreement to referral to [facility] for transition of care due to recent hospitalization, home safety evaluation, care coordination, congestive heart failure disease education, and disease monitoring.  RNCM advised patient of RNCM's contact number, [facility] Care Management main phone number, and 24 hour Nurse Advice line number for future reference.   Objective: Per chart review: Patient hospitalized  05/10/16 - 05/12/16 for congestive heart failure and chest pain.   Patient also has a history of major depressive disorder, dementia, hyperlipidemia, and chronic kidney disease stage 3.     Assessment:  Received [facility] Care Management referral on 05/25/16.   Referral source: [employer].   Referral reason:  Patient has cognitive impairment and memory loss, however at this point, she is still deemed competent.  Sister wants her to live in assisted living and member declines.  Children live in [place].  Sister is overwhelmed with patient's care and  wants children to step up and help more.   However patient declines to give permission to speak with daughter Sharyn Lull.  Currently active with [facility], and Social Worker.  States [facility] is assisting.   Multiple medication discrepancies noted, vary between patient reports, sister reports, and home health reports.  Unclear if meds are being adhered too.   Past medical history: AAA, anxiety, arthritis, CAD, chronic kidney disease stage 3, congestive heart failure, ejection fraction 35%, depression, diverticulitis, GERD, GI Bleed, migraines, hyperlipidemia, hypertension, kidney stones, myocardial infarction, renal cell carcinoma, and  thyroid disease.  Services request: [facility] RN, Education officer, museum, and pharmacist to assist with ongoing CM needs.    Telephone screen completed. No Telephonic RNCM needs.   Patient will continue to receive [facility] Management services.  Plan: RNCM will refer patient to [facility] for transition of care due to recent hospitalization, home safety evaluation, care coordination, congestive heart failure disease education, and disease monitoring.    Madylin Fairbank H. Annia Friendly, BSN, [place] [facility] Telephonic CM Phone: [phone] Fax: [phone]

## 2016-05-29 ENCOUNTER — Other Ambulatory Visit: Payer: Self-pay | Admitting: *Deleted

## 2016-05-29 NOTE — Patient Outreach (Signed)
Telephone call completed, follow up on referral received from [facility] telephonic RN CM 8/29.  Referral - recent hospitalization 8/10-8/12 chest pain, home safety, HF diagnosis education/monitoring. Spoke with pt, HIPAA verified,discussed referral received for [facility] RN CM services to which pt reports already receiving services [facility] health).  RN CM discussed with pt  this is a benefit of her insurance, no cost to her, Primary Care MD a [facility] doctor to which pt agreed to  services.    Plan: As discussed with pt, plan to do an initial home visit this week.    Karen Collins.   [facility] Care Management  [phone]

## 2016-06-01 ENCOUNTER — Encounter: Payer: Self-pay | Admitting: *Deleted

## 2016-06-01 ENCOUNTER — Other Ambulatory Visit: Payer: Self-pay | Admitting: *Deleted

## 2016-06-01 NOTE — Patient Outreach (Signed)
[facility]) Care Management   06/01/2016  Karen Collins 1934-03-30 [id number]  Karen Collins is an 82 y.o. female  Subjective:  Pt reports no complaints of pain, walked around the block.  Pt reports deals with  Anxiety, on medication which helps.   Pt reports lives by herself with two  sisters close by, younger sister usually sits in on visits (home, MD visits),have  memory issues.  Pt reports manages  her own medications.   Pt reports [place] RN from [facility] has one more visit next week.   Objective:   Vitals:   06/01/16 1356  BP: (!) 150/92  Pulse: 80  Resp: 16    ROS  Physical Exam  Constitutional: She is oriented to person, place, and time. She appears well-developed and well-nourished.  Cardiovascular: Normal rate, regular rhythm and normal heart sounds.   Respiratory: Effort normal and breath sounds normal.  GI: Soft. Bowel sounds are normal.  Musculoskeletal: Normal range of motion. She exhibits no edema.  Neurological: She is alert and oriented to person, place, and time.  Psychiatric:  Anxious, forgetful at times.     Encounter Medications:   Outpatient Encounter Prescriptions as of 06/01/2016  Medication Sig Note  . acetaminophen (TYLENOL) 500 MG tablet Take 325 mg by mouth as needed for mild pain or fever.    Marland Kitchen aspirin EC 81 MG tablet Take 81 mg by mouth daily.   . busPIRone (BUSPAR) 5 MG tablet Take 1 tablet (5 mg total) by mouth 2 (two) times daily.   . calcium carbonate (CALCIUM 600) 600 MG TABS Take 600 mg by mouth daily.   06/01/2016: Pt takes Calcium 600 mg + D 3 400 IU  . carvedilol (COREG) 3.125 MG tablet Take 1 tablet (3.125 mg total) by mouth 2 (two) times daily with a meal.   . cetirizine (ZYRTEC) 10 MG tablet Take 10 mg by mouth daily as needed for allergies.  06/01/2016: As needed.   . Cholecalciferol (VITAMIN D) 400 UNITS capsule Take 400 Units by mouth daily. 06/01/2016: Combined with calcium pills.   . citalopram (CELEXA) 20 MG  tablet Take 1 tablet (20 mg total) by mouth daily.   . furosemide (LASIX) 20 MG tablet Take 1 tablet (20 mg total) by mouth daily.   Marland Kitchen lisinopril (PRINIVIL,ZESTRIL) 5 MG tablet TAKE 1 TABLET BY MOUTH DAILY   . loperamide (ANTI-DIARRHEAL) 2 MG tablet Take 2 mg by mouth as needed for diarrhea or loose stools.  06/01/2016: As needed.   . Multiple Vitamin (MULTIVITAMIN) tablet Take 1 tablet by mouth daily.     . Omega-3 Fatty Acids (FISH OIL) 1200 MG CAPS Take 1,200 mg by mouth daily.   . potassium chloride SA (K-DUR,KLOR-CON) 20 MEQ tablet Take 1 tablet (20 mEq total) by mouth daily.   . simvastatin (ZOCOR) 40 MG tablet TAKE ONE TABLET BY MOUTH EVERY NIGHT AT BEDTIME   . traZODone (DESYREL) 50 MG tablet Take 1 tablet (50 mg total) by mouth at bedtime as needed for sleep.   . bisacodyl (DULCOLAX) 5 MG EC tablet Take 5 mg by mouth as needed.   Marland Kitchen ibuprofen (ADVIL,MOTRIN) 600 MG tablet Take 600 mg by mouth as needed.   . memantine (NAMENDA XR) 14 MG CP24 24 hr capsule Take 1 capsule (14 mg total) by mouth daily. (Patient not taking: Reported on 06/01/2016) 06/01/2016: Pt's pharmacy called, to have medication refilled.     . nitroGLYCERIN (NITROSTAT) 0.4 MG SL tablet Place 1  tablet (0.4 mg total) under the tongue every 5 (five) minutes as needed. (Patient not taking: Reported on 06/01/2016) 06/01/2016: Error- pt keeps in purse, uses as needed.    No facility-administered encounter medications on file as of 06/01/2016.     Functional Status:   In your present state of health, do you have any difficulty performing the following activities: 06/01/2016 05/25/2016  Hearing? N N  Vision? N N  Difficulty concentrating or making decisions? Tempie Donning  Walking or climbing stairs? N N  Dressing or bathing? N N  Doing errands, shopping? N N  Preparing Food and eating ? N -  Using the Toilet? N -  In the past six months, have you accidently leaked urine? N -  Do you have problems with loss of bowel control? N -  Managing your  Medications? N -  Managing your Finances? N -  Housekeeping or managing your Housekeeping? N -  Some recent data might be hidden    Fall/Depression Screening:    PHQ 2/9 Scores 06/01/2016 05/28/2016 05/25/2016 07/14/2015 03/14/2015 09/28/2013 07/09/2013  PHQ - 2 Score 0 0 0 0 1 4 2   PHQ- 9 Score - - - - - 7 6    Assessment:  Pleasant  82 year old woman- discussed several times deals with anxiety and  Memory issues, took Buspar during home visit -  anxious after RN CM reviewed her  Medications,did not have Namenda.    With pt's permission, called her pharmacy, request  Made for refill  on Namenda- pt to pick up.                           HF-  Review of pt's recorded weights, today 138 lbs, ranges 137-139 lbs.  Lungs                          Clear, no edema or sob.  Emmi information on Heart Failure provided/reviewed                          With pt.                         Plan:  As discussed, pt to pick up Namenda from pharmacy, take daily as ordered.             Pt to f/u with Dr. [name] 9/26.            Pt to continue to weigh daily, record, call MD for weight gain of >2 lbs in a day, 5 lbs in a week.             Plan to continue to follow pt for transition of care, follow up again next week telephonically.             Plan to inform Dr. Nicki Reaper of [facility] involvement -sent barrier letter and home visit encounter, both                 By in basket.               [facility] CM Care Plan Problem One   Flowsheet Row Most Recent Value  Care Plan Problem One  Risk for readmission related to recent hospitalization for chest pain, HF.   Role Documenting the Problem One  Care Management [place] for  Problem One  Active  THN Long Term Goal (31-90 days)  Pt would not readmit within the next 31 days   THN Long Term Goal Start Date  06/01/16  Interventions for Problem One Long Term Goal  Emmi- provided/reviewed HF - keeping track or your weight everyday, HF-when to go to MD or call 911.    THN CM Short Term Goal #1 (0-30 days)  Pt would take all medications as ordred for the next 30 days   THN CM Short Term Goal #1 Start Date  06/01/16  Interventions for Short Term Goal #1  Reviewed pt's medications, found Namenda missing, pt's pharmacy called- to have med filled/pt to pick up.   THN CM Short Term Goal #2 (0-30 days)  Pt would not have a weight gain of >2 lbs in a day, 5 lbs in a week within the next 30 days   THN CM Short Term Goal #2 Start Date  06/01/16  Interventions for Short Term Goal #2  Provided and reviewed with pt foods low in Na+, foods to avoid,        Zara Chess.   [place] [facility]  [phone]

## 2016-06-07 ENCOUNTER — Other Ambulatory Visit: Payer: Self-pay | Admitting: *Deleted

## 2016-06-07 DIAGNOSIS — Z7982 Long term (current) use of aspirin: Secondary | ICD-10-CM | POA: Diagnosis not present

## 2016-06-07 DIAGNOSIS — I2510 Atherosclerotic heart disease of native coronary artery without angina pectoris: Secondary | ICD-10-CM | POA: Diagnosis not present

## 2016-06-07 DIAGNOSIS — I5022 Chronic systolic (congestive) heart failure: Secondary | ICD-10-CM | POA: Diagnosis not present

## 2016-06-07 DIAGNOSIS — F0391 Unspecified dementia with behavioral disturbance: Secondary | ICD-10-CM | POA: Diagnosis not present

## 2016-06-07 DIAGNOSIS — N183 Chronic kidney disease, stage 3 (moderate): Secondary | ICD-10-CM | POA: Diagnosis not present

## 2016-06-07 DIAGNOSIS — I130 Hypertensive heart and chronic kidney disease with heart failure and stage 1 through stage 4 chronic kidney disease, or unspecified chronic kidney disease: Secondary | ICD-10-CM | POA: Diagnosis not present

## 2016-06-07 DIAGNOSIS — I10 Essential (primary) hypertension: Secondary | ICD-10-CM | POA: Diagnosis not present

## 2016-06-07 DIAGNOSIS — F419 Anxiety disorder, unspecified: Secondary | ICD-10-CM | POA: Diagnosis not present

## 2016-06-07 DIAGNOSIS — F329 Major depressive disorder, single episode, unspecified: Secondary | ICD-10-CM | POA: Diagnosis not present

## 2016-06-07 NOTE — Patient Outreach (Signed)
Transition of care call successful, ongoing follow  up on referral from [facility] telephonic RN CM- pt's  recent hospitalization 8/10-8/12 chest pain,HF, Dementia, chronic anxiety.  Spoke with pt, HIPAA verified.   Pt reports feeling stronger, better,active.  Pt reports did pick up Namenda from her pharmacy same day RN CM did home visit, taking daily.  Pt reports taking all of her other medications.  Pt reports weights staying at 138 lbs, recording, no weight gain.   Pt reports HH RN is currently in the home now.     Plan: As discussed with pt, plan to follow up again telephonically next week.     Zara Chess.   [place] [facility]  [phone]

## 2016-06-13 ENCOUNTER — Other Ambulatory Visit: Payer: Self-pay | Admitting: *Deleted

## 2016-06-13 NOTE — Patient Outreach (Signed)
Transition of care call successful, ongoing follow up on referral from [facility] telephonic RN CM for  recent hospitalization 8/10-8/12 chest pain.  Hx of HF, Dementia, chronic anxiety.  Spoke with pt, HIPAA verified, reports stabilized, weights are the same 140 lbs,weighing daily but realized was not marking weights down.  Pt reports no swelling, sob, chest pain, eating smart, cutting down on her sodium intake, taking walks.      Plan:  As discussed with pt, plan to f/u again next week telephonically as part of ongoing transition of care.     Zara Chess.   [facility] [facility]  [phone]

## 2016-06-21 ENCOUNTER — Ambulatory Visit (INDEPENDENT_AMBULATORY_CARE_PROVIDER_SITE_OTHER): Payer: PPO | Admitting: Psychiatry

## 2016-06-21 DIAGNOSIS — F0391 Unspecified dementia with behavioral disturbance: Secondary | ICD-10-CM

## 2016-06-21 DIAGNOSIS — F03918 Unspecified dementia, unspecified severity, with other behavioral disturbance: Secondary | ICD-10-CM

## 2016-06-21 DIAGNOSIS — F331 Major depressive disorder, recurrent, moderate: Secondary | ICD-10-CM | POA: Diagnosis not present

## 2016-06-21 MED ORDER — MEMANTINE HCL ER 28 MG PO CP24: 28.0000 mg | ORAL_CAPSULE | Freq: Every day | ORAL | 1 refills | Status: DC

## 2016-06-21 MED ORDER — TRAZODONE HCL 50 MG PO TABS: 50.0000 mg | ORAL_TABLET | Freq: Every evening | ORAL | 1 refills | Status: DC | PRN

## 2016-06-21 MED ORDER — BUSPIRONE HCL 5 MG PO TABS: 5.0000 mg | ORAL_TABLET | Freq: Two times a day (BID) | ORAL | 1 refills | Status: DC

## 2016-06-21 MED ORDER — CITALOPRAM HYDROBROMIDE 20 MG PO TABS: 20.0000 mg | ORAL_TABLET | Freq: Every day | ORAL | 1 refills | Status: DC

## 2016-06-21 NOTE — Progress Notes (Signed)
BH MD/PA/NP OP Progress Note  06/21/2016 10:28 AM Karen Collins  MRN:  [id number]  Subjective:    Pt is a 82 yo female who presented for follow up appointment accompanied by her daughter who is currently visiting from [place]. She was admitted to the inpatient hospital due to congestive heart failure and feeling weak. Her chest x-ray showed significant pulmonary edema and her BNP was significantly elevated. Her daughter was concerned about her condition as patient continues to have anxiety and memory issues. Patient reported that she is feeling better and has been living by herself. However her sisters and her neighbors are keeping an eye on her. Patient reported that she takes her medications by herself. However she was not using the pill box. We discussed at length about the efficacy of the pill box and her daughter was on board. Patient finally agreed that she will start using the pill box to take the medications on a daily basis. Her daughter reported that she is not aware that how much medication she is using. We also discussed about increasing the dose of Namenda at this time. She is also being followed by [facility] home health.   They are pleased with her current medications. She is responding well to the BuSpar at this time. She currently denied having any suicidal ideations or plans.  We discussed about her medications in detail. I will refill her medications for the next 2 months. Her family remains supportive. She will have an appointment with the cardiologist next week.    Chief Complaint:   Visit Diagnosis:     ICD-9-CM ICD-10-CM   1. MDD (major depressive disorder), recurrent episode, moderate (HCC) 296.32 F33.1   2. Dementia with behavioral disturbance 294.21 F03.91     Past Medical History:  Past Medical History:  Diagnosis Date  . AAA (abdominal aortic aneurysm) ([place])    s/p stent   . Anxiety   . Arthritis   . CAD (coronary artery disease)   . Chronic kidney  disease (CKD), stage III (moderate)   . Chronic systolic congestive heart failure (HCC)    EF of 35 %   . Depression   . Diverticulitis    H/O  . GERD (gastroesophageal reflux disease)   . GI bleed   . Hx of migraines   . Hx: UTI (urinary tract infection)   . Hyperlipidemia   . Hypertension   . Kidney stones   . MI (myocardial infarction) ([place])    Non-ST elevation MI  . Renal cell carcinoma   . Thyroid disease     Past Surgical History:  Procedure Laterality Date  . ABDOMINAL AORTIC ANEURYSM REPAIR     s/p stent  . ABDOMINAL AORTIC ENDOVASCULAR STENT GRAFT    . CARDIAC CATHETERIZATION    . CARDIAC CATHETERIZATION  09/18/2010  . CHOLECYSTECTOMY    . CORONARY STENT PLACEMENT  09/18/2010   CAD; MI s/p stent  . LAPAROSCOPIC PARTIAL NEPHRECTOMY  2010  . NEPHRECTOMY  10/2008   Right  . PARATHYROIDECTOMY    . SHOULDER SURGERY  07/2010   Right  . THYROIDECTOMY     Family History:  Family History  Problem Relation Age of Onset  . Arthritis Mother   . Heart disease Mother   . Pulmonary fibrosis Mother   . Heart disease Father   . Hodgkin's lymphoma Father   . Breast cancer Sister   . Hyperlipidemia Sister    Social History:  Social History   Social History  .  Marital status: Divorced    Spouse name: N/A  . Number of children: N/A  . Years of education: N/A   Occupational History  . Retired Retired   Social History Main Topics  . Smoking status: Former Smoker    Packs/day: 1.00    Years: 30.00    Quit date: 10/01/2005  . Smokeless tobacco: Never Used  . Alcohol use 4.2 oz/week    4 Glasses of wine, 3 Standard drinks or equivalent per week     Comment: Wine 3-4 times a week   . Drug use: No  . Sexual activity: No   Other Topics Concern  . Not on file   Social History Narrative   Married   Walks occasionally with house work   Additional History: Lives alone .  Assessment:   Musculoskeletal: Strength & Muscle Tone: within normal limits Gait &  Station: normal Patient leans: N/A  Psychiatric Specialty Exam: Medication Refill  Pertinent negatives include no fever or vomiting.  Anxiety  Symptoms include nervous/anxious behavior. Patient reports no suicidal ideas.      Review of Systems  Constitutional: Negative for fever and weight loss.  HENT: Negative for hearing loss and nosebleeds.   Eyes: Negative for photophobia.  Respiratory: Negative for sputum production.   Cardiovascular: Positive for orthopnea.  Gastrointestinal: Negative for diarrhea and vomiting.  Genitourinary: Negative for hematuria.  Neurological: Positive for sensory change. Negative for tingling.  Endo/Heme/Allergies: Negative for environmental allergies.  Psychiatric/Behavioral: Positive for depression. Negative for hallucinations and suicidal ideas. The patient is nervous/anxious.     There were no vitals taken for this visit.There is no height or weight on file to calculate BMI.  General Appearance: Fairly Groomed  Eye Contact:  good  Speech:  Normal Rate  Volume:  Normal   Mood:  Anxious  Affect:  Congruent  Thought Process:  Disorganized  Orientation:  Full (Time, Place, and Person)  Thought Content:  WDL  Suicidal Thoughts:  No  Homicidal Thoughts:  No  Memory:  Recent;   Poor  Judgement:  Impaired  Insight:  Fair and Lacking  Psychomotor Activity:  Normal  Concentration:  Fair  Recall:  [employer] of Knowledge: Fair  Language: Fair  Akathisia:  No  Handed:  Right  AIMS (if indicated):  none  Assets:  Communication Skills Desire for Improvement Physical Health  ADL's:  Intact  Cognition: WNL  Sleep:  6-7    Is the patient at risk to self?  No. Has the patient been a risk to self in the past 6 months?  No. Has the patient been a risk to self within the distant past?  No. Is the patient a risk to others?  No. Has the patient been a risk to others in the past 6 months?  No. Has the patient been a risk to others within the distant  past?  No.  Current Medications: Current Outpatient Prescriptions  Medication Sig Dispense Refill  . acetaminophen (TYLENOL) 500 MG tablet Take 325 mg by mouth as needed for mild pain or fever.     Marland Kitchen aspirin EC 81 MG tablet Take 81 mg by mouth daily.    . busPIRone (BUSPAR) 5 MG tablet Take 1 tablet (5 mg total) by mouth 2 (two) times daily. 60 tablet 1  . calcium carbonate (CALCIUM 600) 600 MG TABS Take 600 mg by mouth daily.      . carvedilol (COREG) 3.125 MG tablet Take 1 tablet (3.125 mg total) by  mouth 2 (two) times daily with a meal. 60 tablet 1  . cetirizine (ZYRTEC) 10 MG tablet Take 10 mg by mouth daily as needed for allergies.     . Cholecalciferol (VITAMIN D) 400 UNITS capsule Take 400 Units by mouth daily.    . citalopram (CELEXA) 20 MG tablet Take 1 tablet (20 mg total) by mouth daily. 30 tablet 1  . furosemide (LASIX) 20 MG tablet Take 1 tablet (20 mg total) by mouth daily. 30 tablet 1  . ibuprofen (ADVIL,MOTRIN) 600 MG tablet Take 600 mg by mouth as needed.    Marland Kitchen lisinopril (PRINIVIL,ZESTRIL) 5 MG tablet TAKE 1 TABLET BY MOUTH DAILY 90 tablet 4  . memantine (NAMENDA XR) 28 MG CP24 24 hr capsule Take 1 capsule (28 mg total) by mouth daily. 30 capsule 1  . Multiple Vitamin (MULTIVITAMIN) tablet Take 1 tablet by mouth daily.      . nitroGLYCERIN (NITROSTAT) 0.4 MG SL tablet Place 1 tablet (0.4 mg total) under the tongue every 5 (five) minutes as needed. (Patient not taking: Reported on 06/01/2016) 30 tablet 1  . Omega-3 Fatty Acids (FISH OIL) 1200 MG CAPS Take 1,200 mg by mouth daily.    . potassium chloride SA (K-DUR,KLOR-CON) 20 MEQ tablet Take 1 tablet (20 mEq total) by mouth daily. 30 tablet 0  . simvastatin (ZOCOR) 40 MG tablet TAKE ONE TABLET BY MOUTH EVERY NIGHT AT BEDTIME 90 tablet 2  . traZODone (DESYREL) 50 MG tablet Take 1 tablet (50 mg total) by mouth at bedtime as needed for sleep. 30 tablet 1   No current facility-administered medications for this visit.     Medical  Decision Making:  Established Problem, Stable/Improving (1)  Treatment Plan Summary:Medication management   Memory impairment Increase   Namenda XR 28 mg by mouth daily.  Depression Continue  Celexa 20 mg by mouth daily  Continue  BuSpar 5 mg by mouth twice a day for her anxiety and depression   Discussed with patient and her daughter at length about the medications treatment risks benefits and alternatives. Advised her to stop drinking alcohol and she agreed with the plan.  Follow-up Advised patient to follow up in 4 weeks  or earlier depending on her symptoms    More than 50% of the time spent in psychoeducation, counseling and coordination of care related to her anxiety and memory  Time spent with the patient 25 minutes   This note was generated in part or whole with voice recognition software. Voice regonition is usually quite accurate but there are transcription errors that can and very often do occur. I apologize for any typographical errors that were not detected and corrected.     Rainey Pines, MD  06/21/2016, 10:28 AM

## 2016-06-22 ENCOUNTER — Other Ambulatory Visit: Payer: Self-pay | Admitting: *Deleted

## 2016-06-22 NOTE — Patient Outreach (Signed)
Transition of care call successful.  Follow up on referral from [facility] telephonic RN CM- recent hospitalization 8/10-8/12 chest pain, acute CHF.  Spoke with pt, HIPAA verified.  Pt reports doing good, daughter visiting from [place].  Pt reports getting the bubble pack from her pharmacy now.  Pt reports on recent office visit with psychiatrist- MD doubled her Namenda, started taking.  Pt reports weighing daily, recording, weight today 137 lbs., ranges 137-139 lbs. Pt reports to follow up at HF clinic 9/26.     Plan: As discussed with pt, plan to follow up again next telephonically as part of ongoing transition of care.     Zara Chess.   [place] [facility]  [phone]

## 2016-06-26 ENCOUNTER — Ambulatory Visit: Payer: PPO | Attending: Family | Admitting: Family

## 2016-06-26 ENCOUNTER — Encounter: Payer: Self-pay | Admitting: Family

## 2016-06-26 VITALS — BP 142/80 | HR 67 | Resp 18 | Ht 64.0 in | Wt 137.0 lb

## 2016-06-26 DIAGNOSIS — F419 Anxiety disorder, unspecified: Secondary | ICD-10-CM | POA: Diagnosis not present

## 2016-06-26 DIAGNOSIS — N183 Chronic kidney disease, stage 3 (moderate): Secondary | ICD-10-CM | POA: Insufficient documentation

## 2016-06-26 DIAGNOSIS — E785 Hyperlipidemia, unspecified: Secondary | ICD-10-CM | POA: Diagnosis not present

## 2016-06-26 DIAGNOSIS — I10 Essential (primary) hypertension: Secondary | ICD-10-CM | POA: Diagnosis not present

## 2016-06-26 DIAGNOSIS — Z955 Presence of coronary angioplasty implant and graft: Secondary | ICD-10-CM | POA: Insufficient documentation

## 2016-06-26 DIAGNOSIS — Z87891 Personal history of nicotine dependence: Secondary | ICD-10-CM | POA: Diagnosis not present

## 2016-06-26 DIAGNOSIS — I2510 Atherosclerotic heart disease of native coronary artery without angina pectoris: Secondary | ICD-10-CM | POA: Diagnosis not present

## 2016-06-26 DIAGNOSIS — Z87440 Personal history of urinary (tract) infections: Secondary | ICD-10-CM | POA: Diagnosis not present

## 2016-06-26 DIAGNOSIS — G43909 Migraine, unspecified, not intractable, without status migrainosus: Secondary | ICD-10-CM | POA: Diagnosis not present

## 2016-06-26 DIAGNOSIS — E079 Disorder of thyroid, unspecified: Secondary | ICD-10-CM | POA: Diagnosis not present

## 2016-06-26 DIAGNOSIS — I5022 Chronic systolic (congestive) heart failure: Secondary | ICD-10-CM | POA: Insufficient documentation

## 2016-06-26 DIAGNOSIS — Z85528 Personal history of other malignant neoplasm of kidney: Secondary | ICD-10-CM | POA: Insufficient documentation

## 2016-06-26 DIAGNOSIS — Z87442 Personal history of urinary calculi: Secondary | ICD-10-CM | POA: Diagnosis not present

## 2016-06-26 DIAGNOSIS — I130 Hypertensive heart and chronic kidney disease with heart failure and stage 1 through stage 4 chronic kidney disease, or unspecified chronic kidney disease: Secondary | ICD-10-CM | POA: Diagnosis not present

## 2016-06-26 DIAGNOSIS — I252 Old myocardial infarction: Secondary | ICD-10-CM | POA: Insufficient documentation

## 2016-06-26 DIAGNOSIS — R413 Other amnesia: Secondary | ICD-10-CM | POA: Insufficient documentation

## 2016-06-26 DIAGNOSIS — K219 Gastro-esophageal reflux disease without esophagitis: Secondary | ICD-10-CM | POA: Diagnosis not present

## 2016-06-26 DIAGNOSIS — Z9889 Other specified postprocedural states: Secondary | ICD-10-CM | POA: Diagnosis not present

## 2016-06-26 MED ORDER — SACUBITRIL-VALSARTAN 24-26 MG PO TABS: 1.0000 | ORAL_TABLET | Freq: Two times a day (BID) | ORAL | 3 refills | Status: DC

## 2016-06-26 NOTE — Progress Notes (Signed)
Patient ID: Karen Collins, female    DOB: Feb 19, 1934, 82 y.o.   MRN: [id number]  HPI  Ms Karen Collins is a 82 y/o female with a history of HTN, MI, AAA, CKD stage 3, anxiety, memory loss, renal cell cancer and HF with a reduced EF who presents today for a follow-up visit.   Last echo done 05/11/16 with an EF of 35-40%, mild MR and no aortic stenosis. Last catheterization was done December 2011.  Last hospital admission was August 2017 with chest pain/elevated troponin & acute systolic HF. Was diuresed and sent home on diuretics.   Returns today for f/u. Does continue to have fatigue and shortness of breath with moderate exertion. No edema and no difficulty sleeping. Is now taking medications consistently because they are in a blister pack that is delivered by her pharmacy on a weekly basis. Has not seen cardiologist Rockey Situ) recently.   Past Medical History:  Diagnosis Date  . AAA (abdominal aortic aneurysm) ([place])    s/p stent   . Anxiety   . Arthritis   . CAD (coronary artery disease)   . Chronic kidney disease (CKD), stage III (moderate)   . Chronic systolic congestive heart failure (HCC)    EF of 35 %   . Depression   . Diverticulitis    H/O  . GERD (gastroesophageal reflux disease)   . GI bleed   . Hx of migraines   . Hx: UTI (urinary tract infection)   . Hyperlipidemia   . Hypertension   . Kidney stones   . MI (myocardial infarction) ([place])    Non-ST elevation MI  . Renal cell carcinoma   . Thyroid disease     Past Surgical History:  Procedure Laterality Date  . ABDOMINAL AORTIC ANEURYSM REPAIR     s/p stent  . ABDOMINAL AORTIC ENDOVASCULAR STENT GRAFT    . CARDIAC CATHETERIZATION    . CARDIAC CATHETERIZATION  09/18/2010  . CHOLECYSTECTOMY    . CORONARY STENT PLACEMENT  09/18/2010   CAD; MI s/p stent  . LAPAROSCOPIC PARTIAL NEPHRECTOMY  2010  . NEPHRECTOMY  10/2008   Right  . PARATHYROIDECTOMY    . SHOULDER SURGERY  07/2010   Right  . THYROIDECTOMY       Family History  Problem Relation Age of Onset  . Arthritis Mother   . Heart disease Mother   . Pulmonary fibrosis Mother   . Heart disease Father   . Hodgkin's lymphoma Father   . Breast cancer Sister   . Hyperlipidemia Sister     Social History  Substance Use Topics  . Smoking status: Former Smoker    Packs/day: 1.00    Years: 30.00    Quit date: 10/01/2005  . Smokeless tobacco: Never Used  . Alcohol use 4.2 oz/week    4 Glasses of wine, 3 Standard drinks or equivalent per week     Comment: Wine 3-4 times a week     Allergies  Allergen Reactions  . Tramadol Rash    Prior to Admission medications   Medication Sig Start Date End Date Taking? Authorizing Provider  acetaminophen (TYLENOL) 500 MG tablet Take 325 mg by mouth as needed for mild pain or fever.    Yes Historical Provider, MD  aspirin EC 81 MG tablet Take 81 mg by mouth daily.   Yes Historical Provider, MD  busPIRone (BUSPAR) 5 MG tablet Take 1 tablet (5 mg total) by mouth 2 (two) times daily. 06/21/16  Yes Rainey Pines, MD  calcium carbonate (CALCIUM 600) 600 MG TABS Take 600 mg by mouth daily.     Yes Historical Provider, MD  carvedilol (COREG) 3.125 MG tablet Take 1 tablet (3.125 mg total) by mouth 2 (two) times daily with a meal. 05/12/16  Yes Fritzi Mandes, MD  cetirizine (ZYRTEC) 10 MG tablet Take 10 mg by mouth daily as needed for allergies.    Yes Historical Provider, MD  citalopram (CELEXA) 20 MG tablet Take 1 tablet (20 mg total) by mouth daily. 06/21/16  Yes Rainey Pines, MD  furosemide (LASIX) 20 MG tablet Take 1 tablet (20 mg total) by mouth daily. 05/12/16  Yes Fritzi Mandes, MD  ibuprofen (ADVIL,MOTRIN) 600 MG tablet Take 600 mg by mouth as needed.   Yes Historical Provider, MD  memantine (NAMENDA XR) 28 MG CP24 24 hr capsule Take 1 capsule (28 mg total) by mouth daily. 06/21/16  Yes Rainey Pines, MD  nitroGLYCERIN (NITROSTAT) 0.4 MG SL tablet Place 1 tablet (0.4 mg total) under the tongue every 5 (five) minutes  as needed. 05/25/16  Yes Alisa Graff, FNP  Omega-3 Fatty Acids (FISH OIL) 1200 MG CAPS Take 1,200 mg by mouth daily.   Yes Historical Provider, MD  potassium chloride SA (K-DUR,KLOR-CON) 20 MEQ tablet Take 1 tablet (20 mEq total) by mouth daily. 05/12/16  Yes Fritzi Mandes, MD  simvastatin (ZOCOR) 40 MG tablet TAKE ONE TABLET BY MOUTH EVERY NIGHT AT BEDTIME 11/24/15  Yes [name], MD  traZODone (DESYREL) 50 MG tablet Take 1 tablet (50 mg total) by mouth at bedtime as needed for sleep. 06/21/16  Yes Rainey Pines, MD  Multiple Vitamin (MULTIVITAMIN) tablet Take 1 tablet by mouth daily.      Historical Provider, MD  sacubitril-valsartan (ENTRESTO) 24-26 MG Take 1 tablet by mouth 2 (two) times daily. 06/26/16   Alisa Graff, FNP     Review of Systems  Constitutional: Positive for fatigue. Negative for appetite change.  HENT: Negative for congestion, postnasal drip and sore throat.   Eyes: Negative.   Respiratory: Positive for shortness of breath. Negative for cough and chest tightness.   Cardiovascular: Negative for chest pain, palpitations and leg swelling.  Gastrointestinal: Negative for abdominal distention and abdominal pain.  Endocrine: Negative.   Genitourinary: Negative.   Musculoskeletal: Negative for back pain and neck pain.  Skin: Negative.   Allergic/Immunologic: Negative.   Neurological: Negative for dizziness and light-headedness.  Hematological: Negative for adenopathy. Does not bruise/bleed easily.  Psychiatric/Behavioral: Negative for dysphoric mood and sleep disturbance. The patient is not nervous/anxious.     Vitals:   06/26/16 0954 06/26/16 1006  BP: (!) 152/81 (!) 142/80  Pulse: 67   Resp: 18   SpO2: 94%   Weight: 137 lb (62.1 kg)   Height: 5\' 4"  (1.626 m)       Physical Exam  Constitutional: She is oriented to person, place, and time. She appears well-developed and well-nourished.  HENT:  Head: Normocephalic and atraumatic.  Eyes: Conjunctivae are normal.  Pupils are equal, round, and reactive to light.  Neck: Normal range of motion. Neck supple.  Cardiovascular: Normal rate and regular rhythm.   Pulmonary/Chest: Effort normal. She has no wheezes. She has no rales.  Abdominal: Soft. She exhibits no distension. There is no tenderness.  Musculoskeletal: She exhibits no edema or tenderness.  Neurological: She is alert and oriented to person, place, and time.  Skin: Skin is warm and dry.  Psychiatric: She has a normal mood and affect. Her behavior  is normal.  Nursing note and vitals reviewed.  Assessment & Plan:  1: Chronic heart failure with reduced ejection fraction- - EF of 35-40% as of 05/11/16 ischemic in origin.  - NYHA Class 2 - Volume status stable, lungs clear, no edema - Will switch to Entresto. Sister that was present made notes. Also spoke directly to patient's pharmacist Idalia Needle) to review instructions since he does a weekly blister pack for patient. She will finish the lisinopril in the current blister pack and when he makes the new blister pack, he's to skip 1 day without ACE or ARB (36 hour washout period) and then begin entresto 24/26mg  twice daily.  - Will check basic metabolic panel at next visit.  [facility] went [facility] and reviewed medications - Needs followup with cardiologist  2: HTN- - Mildly elevated/ Slightly improved with manual recheck - Switching to entresto per above - Sees PCP 08/15/16  3: Memory change- - Follows closely with psychiatrist - Namenda recently increased - Using weekly blister packs delivered by her pharmacy  Return here in 1 month or sooner for any questions/problems before then.

## 2016-06-26 NOTE — Patient Instructions (Addendum)
Continue weighing daily and call for an overnight weight gain of > 2 pounds or a weekly weight gain of >5 pounds.  Finish blister pack of medications with lisinopril in it. Will then begin entresto 24/26mg  twice daily after calling your pharmacist.

## 2016-06-28 ENCOUNTER — Other Ambulatory Visit: Payer: Self-pay | Admitting: *Deleted

## 2016-06-28 NOTE — Patient Outreach (Addendum)
Transition of care call successful, ongoing follow up on referral received from [facility] telephonic RN CM - recent hospitalization 8/10-8/12 chest pain, acute CHF.  Spoke with pt, HIPAA verified.  Pt reports doing good, on her feet again, doing her own grocery shopping. RN CM discussed with pt recent office visit with Heart MD Otila Kluver NP) 9/26,  view of NP's note- started her on new medication Entresto, to take the place of Lisinopril.   Pt reports received a new blister pack from pharmacy 2 days ago, did not see the new medication.  RN CM informed pt her pharmacy was called , new medication to  start with next blister pack to which pt reports will keep an eye on that.   Pt reports weights staying within 2 lbs, weight today 134 lbs, her goal weight is 135 lbs,  Weights range 137, 138 lbs.  Pt reports no edema, sob, chest pain.    Plan: As discussed with pt, plan to follow up again next week telephonically- final transition of care call.     Zara Chess.   [place] [facility]  [phone]

## 2016-06-29 ENCOUNTER — Other Ambulatory Visit: Payer: Self-pay | Admitting: Internal Medicine

## 2016-07-02 ENCOUNTER — Encounter: Payer: Self-pay | Admitting: *Deleted

## 2016-07-02 ENCOUNTER — Other Ambulatory Visit: Payer: Self-pay | Admitting: *Deleted

## 2016-07-02 NOTE — Patient Outreach (Signed)
Transition (final) transition of care call successful, follow up on recent hospitalization 8/10-8/12 chest pain, acute CHF.  Spoke with pt, HIPAA verified, reports stable.   Pt reports no sob, edema, chest pain.  Pt reports weighing daily, recording but when reviewed her weights  last weight 9/28 138 lbs, did not weight today, weights range 137-139 lbs.   Pt reports working on adhering to Low Na+ diet.  Pt reports received new blister pack (from her pharmacy) yesterday to which RN CM requested confirm include 2 new medications Namenda 28 mg and Sacubitril-valsartan- pt confirmed.   RN CM discussed with pt today final transition of care call, pt agreed no further nurse case management needs.    Plan:  As discussed with pt, case closure letter to be sent, call [facility] main number if needs arise in the future.           Care plan updated.            Plan to send Dr. Nicki Reaper case closure letter.             Plan to inform [facility] CM assistant to close case.  Zara Chess.   [place] [facility]  [phone]

## 2016-07-13 ENCOUNTER — Other Ambulatory Visit: Payer: Self-pay

## 2016-07-13 DIAGNOSIS — N183 Chronic kidney disease, stage 3 unspecified: Secondary | ICD-10-CM

## 2016-07-13 MED ORDER — POTASSIUM CHLORIDE CRYS ER 20 MEQ PO TBCR: 20.0000 meq | EXTENDED_RELEASE_TABLET | Freq: Every day | ORAL | 0 refills | Status: DC

## 2016-07-13 NOTE — Progress Notes (Signed)
Patient scheduled lab appmt

## 2016-07-13 NOTE — Progress Notes (Signed)
Last filled in Ed, ok to fill?  I ok'd the potassium refill since on lasix, but she needs a f/u met b drawn beginning of next week to see how potassium, sodium and kidney function are doing.  Please schedule non fasting lab.    Dr Nicki Reaper

## 2016-07-18 ENCOUNTER — Telehealth: Payer: Self-pay | Admitting: Psychiatry

## 2016-07-18 ENCOUNTER — Other Ambulatory Visit: Payer: Self-pay

## 2016-07-18 ENCOUNTER — Telehealth: Payer: Self-pay | Admitting: Internal Medicine

## 2016-07-18 NOTE — Telephone Encounter (Signed)
Pt son called about pt not taking her meds everyday. Son is concerned.  Pt is taking them as she feels to. Son would like pt to be referred to have home health come in the home. Please advise?  Waunita Schooner pt son @ [phone]. Thank you!

## 2016-07-19 ENCOUNTER — Institutional Professional Consult (permissible substitution): Payer: PPO | Admitting: Psychiatry

## 2016-07-19 NOTE — Telephone Encounter (Signed)
Karen Collins, Please call Inez Catalina (pts sister).  We had set up for home health to go out to her house.  [facility] was consulted.  Need to see if they are still coming.    (pt at one point was refusing to have them come).  Just need clarification.

## 2016-07-19 NOTE — Telephone Encounter (Signed)
Please advise 

## 2016-07-19 NOTE — Telephone Encounter (Signed)
He needs to contact PCP to get help with Home health agencies.

## 2016-07-19 NOTE — Telephone Encounter (Signed)
I talked with Karen Collins on the phone.  They had [facility] for a two week period post care that was more of interm care.  They gave the family a list of stable agencies that can come more regularly to assist the patient.  They have sent up blister packs for the patient's medications and that seemed to be working well, but Karen Collins got a phone call from the pharmacy the other day that Karen Collins was not taking her meds the right way due to her dementia. So Karen Collins was concerned that they needed assistance again.  Karen Collins had forgotten that they had a list to refer to to get a assistance. (Missed communication amongst the family)  Karen Collins had called him yesterday to remind him, so they are working on that currently.  Karen Collins wanted you to know that Karen Collins missed her lab appt yesterday and will probably not go to her Psych appt today as Karen Collins had Cataract surgery yesterday and can't drive her.  Karen Collins will not ride with anyone else at this point.  So at this point they don't need anything from [place], I advised if she does to let us know and we will assist. thanks

## 2016-07-29 DIAGNOSIS — I5022 Chronic systolic (congestive) heart failure: Secondary | ICD-10-CM | POA: Diagnosis not present

## 2016-07-29 DIAGNOSIS — F329 Major depressive disorder, single episode, unspecified: Secondary | ICD-10-CM | POA: Diagnosis not present

## 2016-07-29 DIAGNOSIS — I2510 Atherosclerotic heart disease of native coronary artery without angina pectoris: Secondary | ICD-10-CM | POA: Diagnosis not present

## 2016-07-29 DIAGNOSIS — I130 Hypertensive heart and chronic kidney disease with heart failure and stage 1 through stage 4 chronic kidney disease, or unspecified chronic kidney disease: Secondary | ICD-10-CM | POA: Diagnosis not present

## 2016-07-29 DIAGNOSIS — F0390 Unspecified dementia without behavioral disturbance: Secondary | ICD-10-CM | POA: Diagnosis not present

## 2016-07-29 DIAGNOSIS — N183 Chronic kidney disease, stage 3 (moderate): Secondary | ICD-10-CM | POA: Diagnosis not present

## 2016-07-30 ENCOUNTER — Ambulatory Visit: Payer: Self-pay | Admitting: Family

## 2016-08-03 ENCOUNTER — Ambulatory Visit: Payer: Self-pay | Admitting: Family

## 2016-08-03 ENCOUNTER — Other Ambulatory Visit: Payer: Self-pay | Admitting: Internal Medicine

## 2016-08-04 DIAGNOSIS — F329 Major depressive disorder, single episode, unspecified: Secondary | ICD-10-CM | POA: Diagnosis not present

## 2016-08-04 DIAGNOSIS — I130 Hypertensive heart and chronic kidney disease with heart failure and stage 1 through stage 4 chronic kidney disease, or unspecified chronic kidney disease: Secondary | ICD-10-CM | POA: Diagnosis not present

## 2016-08-04 DIAGNOSIS — I2510 Atherosclerotic heart disease of native coronary artery without angina pectoris: Secondary | ICD-10-CM | POA: Diagnosis not present

## 2016-08-04 DIAGNOSIS — F0390 Unspecified dementia without behavioral disturbance: Secondary | ICD-10-CM | POA: Diagnosis not present

## 2016-08-04 DIAGNOSIS — I5022 Chronic systolic (congestive) heart failure: Secondary | ICD-10-CM | POA: Diagnosis not present

## 2016-08-04 DIAGNOSIS — N183 Chronic kidney disease, stage 3 (moderate): Secondary | ICD-10-CM | POA: Diagnosis not present

## 2016-08-10 ENCOUNTER — Other Ambulatory Visit: Payer: Self-pay

## 2016-08-13 ENCOUNTER — Other Ambulatory Visit: Payer: Self-pay

## 2016-08-15 ENCOUNTER — Encounter: Payer: Self-pay | Admitting: Internal Medicine

## 2016-08-18 ENCOUNTER — Other Ambulatory Visit: Payer: Self-pay | Admitting: Psychiatry

## 2016-08-25 ENCOUNTER — Other Ambulatory Visit: Payer: Self-pay | Admitting: Internal Medicine

## 2016-08-28 ENCOUNTER — Other Ambulatory Visit: Payer: Self-pay

## 2016-08-28 ENCOUNTER — Ambulatory Visit: Payer: Self-pay | Admitting: Family

## 2016-08-28 NOTE — Progress Notes (Signed)
Ok to fill? Last filled 05/12/16 by Dr.patel   She was seeing [name] - with the heart clinic.  She is overdue f/u with her.  Also needs a f/u appt with cardiology.  Please contact pts sister Karen Collins and inform her that Karen Collins has not followed up with cardiology, [name] or me recently.  Overdue labs.  Needs a f/u metabolic panel and f/u with cardiology asap.  Ok to refill lasix x 1, but needs the above scheduled/arranged.

## 2016-09-01 ENCOUNTER — Other Ambulatory Visit: Payer: Self-pay | Admitting: Internal Medicine

## 2016-09-01 ENCOUNTER — Other Ambulatory Visit: Payer: Self-pay | Admitting: Psychiatry

## 2016-09-03 ENCOUNTER — Ambulatory Visit: Payer: Self-pay | Admitting: Family

## 2016-09-03 NOTE — Telephone Encounter (Signed)
[place] [place] called needing a Rx update for pt refill. Please advise? Thank you!

## 2016-09-03 NOTE — Progress Notes (Addendum)
Spoke with Roselyn Reef at [facility] cancelled remaining refills on Lasix spoke with Roselyn Reef... Appointment scheduled for lab work 09/04/16 .  She hasn't re- scheduled appointment for cardiology yet.      See note.  It appears the refill was sent in for lasix #30 with 2 refills.  I ok'd lasix #30 with no refill for now.  Please call pharmacy and change the rx for #30 with no refills.  Also notify pts sister Inez Catalina, they need to go ahead and call and schedule a f/u with cardiology or at least cardiac rehab.

## 2016-09-03 NOTE — Progress Notes (Signed)
Please complete the following.

## 2016-09-04 ENCOUNTER — Other Ambulatory Visit: Payer: Self-pay

## 2016-09-13 ENCOUNTER — Other Ambulatory Visit: Payer: Self-pay | Admitting: Internal Medicine

## 2016-10-06 ENCOUNTER — Other Ambulatory Visit: Payer: Self-pay | Admitting: Psychiatry

## 2016-10-09 NOTE — Telephone Encounter (Signed)
done

## 2016-11-13 ENCOUNTER — Other Ambulatory Visit (INDEPENDENT_AMBULATORY_CARE_PROVIDER_SITE_OTHER): Payer: PPO

## 2016-11-13 ENCOUNTER — Ambulatory Visit (INDEPENDENT_AMBULATORY_CARE_PROVIDER_SITE_OTHER): Payer: Self-pay | Admitting: Vascular Surgery

## 2016-12-10 ENCOUNTER — Inpatient Hospital Stay (HOSPITAL_COMMUNITY)
Admit: 2016-12-10 | Discharge: 2016-12-10 | Disposition: A | Discharge status: Home / Self Care | Payer: PPO | Attending: Internal Medicine | Admitting: Internal Medicine

## 2016-12-10 ENCOUNTER — Encounter: Payer: Self-pay | Admitting: Emergency Medicine

## 2016-12-10 ENCOUNTER — Inpatient Hospital Stay
Admission: EM | Admit: 2016-12-10 | Discharge: 2016-12-12 | DRG: 065 | Disposition: A | Discharge status: Home Health | Payer: PPO | Attending: Internal Medicine | Admitting: Internal Medicine

## 2016-12-10 ENCOUNTER — Emergency Department: Payer: PPO

## 2016-12-10 ENCOUNTER — Inpatient Hospital Stay: Payer: PPO

## 2016-12-10 DIAGNOSIS — Z87891 Personal history of nicotine dependence: Secondary | ICD-10-CM

## 2016-12-10 DIAGNOSIS — E890 Postprocedural hypothyroidism: Secondary | ICD-10-CM | POA: Diagnosis present

## 2016-12-10 DIAGNOSIS — Z955 Presence of coronary angioplasty implant and graft: Secondary | ICD-10-CM | POA: Diagnosis not present

## 2016-12-10 DIAGNOSIS — F329 Major depressive disorder, single episode, unspecified: Secondary | ICD-10-CM | POA: Diagnosis present

## 2016-12-10 DIAGNOSIS — R531 Weakness: Secondary | ICD-10-CM | POA: Diagnosis not present

## 2016-12-10 DIAGNOSIS — G459 Transient cerebral ischemic attack, unspecified: Secondary | ICD-10-CM

## 2016-12-10 DIAGNOSIS — K219 Gastro-esophageal reflux disease without esophagitis: Secondary | ICD-10-CM | POA: Diagnosis present

## 2016-12-10 DIAGNOSIS — F0390 Unspecified dementia without behavioral disturbance: Secondary | ICD-10-CM | POA: Diagnosis not present

## 2016-12-10 DIAGNOSIS — I639 Cerebral infarction, unspecified: Secondary | ICD-10-CM | POA: Diagnosis present

## 2016-12-10 DIAGNOSIS — Z8249 Family history of ischemic heart disease and other diseases of the circulatory system: Secondary | ICD-10-CM | POA: Diagnosis not present

## 2016-12-10 DIAGNOSIS — R4701 Aphasia: Secondary | ICD-10-CM | POA: Diagnosis not present

## 2016-12-10 DIAGNOSIS — Z807 Family history of other malignant neoplasms of lymphoid, hematopoietic and related tissues: Secondary | ICD-10-CM

## 2016-12-10 DIAGNOSIS — Z905 Acquired absence of kidney: Secondary | ICD-10-CM

## 2016-12-10 DIAGNOSIS — I6523 Occlusion and stenosis of bilateral carotid arteries: Secondary | ICD-10-CM | POA: Diagnosis not present

## 2016-12-10 DIAGNOSIS — G8321 Monoplegia of upper limb affecting right dominant side: Secondary | ICD-10-CM | POA: Diagnosis not present

## 2016-12-10 DIAGNOSIS — Z85528 Personal history of other malignant neoplasm of kidney: Secondary | ICD-10-CM | POA: Diagnosis not present

## 2016-12-10 DIAGNOSIS — R29810 Facial weakness: Secondary | ICD-10-CM | POA: Diagnosis present

## 2016-12-10 DIAGNOSIS — I10 Essential (primary) hypertension: Secondary | ICD-10-CM | POA: Diagnosis not present

## 2016-12-10 DIAGNOSIS — R471 Dysarthria and anarthria: Secondary | ICD-10-CM | POA: Diagnosis not present

## 2016-12-10 DIAGNOSIS — F419 Anxiety disorder, unspecified: Secondary | ICD-10-CM | POA: Diagnosis not present

## 2016-12-10 DIAGNOSIS — N183 Chronic kidney disease, stage 3 (moderate): Secondary | ICD-10-CM | POA: Diagnosis not present

## 2016-12-10 DIAGNOSIS — I63412 Cerebral infarction due to embolism of left middle cerebral artery: Principal | ICD-10-CM | POA: Diagnosis present

## 2016-12-10 DIAGNOSIS — I5022 Chronic systolic (congestive) heart failure: Secondary | ICD-10-CM | POA: Diagnosis not present

## 2016-12-10 DIAGNOSIS — I252 Old myocardial infarction: Secondary | ICD-10-CM

## 2016-12-10 DIAGNOSIS — I2510 Atherosclerotic heart disease of native coronary artery without angina pectoris: Secondary | ICD-10-CM | POA: Diagnosis not present

## 2016-12-10 DIAGNOSIS — Z8261 Family history of arthritis: Secondary | ICD-10-CM

## 2016-12-10 DIAGNOSIS — R4182 Altered mental status, unspecified: Secondary | ICD-10-CM | POA: Diagnosis not present

## 2016-12-10 DIAGNOSIS — E785 Hyperlipidemia, unspecified: Secondary | ICD-10-CM | POA: Diagnosis present

## 2016-12-10 DIAGNOSIS — I130 Hypertensive heart and chronic kidney disease with heart failure and stage 1 through stage 4 chronic kidney disease, or unspecified chronic kidney disease: Secondary | ICD-10-CM | POA: Diagnosis not present

## 2016-12-10 DIAGNOSIS — Z803 Family history of malignant neoplasm of breast: Secondary | ICD-10-CM | POA: Diagnosis not present

## 2016-12-10 DIAGNOSIS — R410 Disorientation, unspecified: Secondary | ICD-10-CM | POA: Diagnosis not present

## 2016-12-10 DIAGNOSIS — Z7982 Long term (current) use of aspirin: Secondary | ICD-10-CM | POA: Diagnosis not present

## 2016-12-10 HISTORY — DX: Unspecified dementia, unspecified severity, without behavioral disturbance, psychotic disturbance, mood disturbance, and anxiety: F03.90

## 2016-12-10 LAB — URINALYSIS, ROUTINE W REFLEX MICROSCOPIC
BILIRUBIN URINE: NEGATIVE
Glucose, UA: NEGATIVE mg/dL
Hgb urine dipstick: NEGATIVE
KETONES UR: 5 mg/dL — AB
LEUKOCYTES UA: NEGATIVE
NITRITE: NEGATIVE
PH: 7 (ref 5.0–8.0)
PROTEIN: NEGATIVE mg/dL
Specific Gravity, Urine: 1.009 (ref 1.005–1.030)

## 2016-12-10 LAB — CBC
HCT: 44.4 % (ref 35.0–47.0)
Hemoglobin: 15.2 g/dL (ref 12.0–16.0)
MCH: 32.1 pg (ref 26.0–34.0)
MCHC: 34.4 g/dL (ref 32.0–36.0)
MCV: 93.5 fL (ref 80.0–100.0)
PLATELETS: 225 10*3/uL (ref 150–440)
RBC: 4.75 MIL/uL (ref 3.80–5.20)
RDW: 13.3 % (ref 11.5–14.5)
WBC: 4.2 10*3/uL (ref 3.6–11.0)

## 2016-12-10 LAB — TROPONIN I: Troponin I: 0.03 ng/mL (ref <0.03)

## 2016-12-10 LAB — COMPREHENSIVE METABOLIC PANEL
ALT: 10 U/L — AB (ref 14–54)
AST: 27 U/L (ref 15–41)
Albumin: 3.9 g/dL (ref 3.5–5.0)
Alkaline Phosphatase: 69 U/L (ref 38–126)
Anion gap: 7 (ref 5–15)
BUN: 11 mg/dL (ref 6–20)
CHLORIDE: 105 mmol/L (ref 101–111)
CO2: 24 mmol/L (ref 22–32)
CREATININE: 1.16 mg/dL — AB (ref 0.44–1.00)
Calcium: 9.7 mg/dL (ref 8.9–10.3)
GFR, EST AFRICAN AMERICAN: 50 mL/min — AB (ref >60)
GFR, EST NON AFRICAN AMERICAN: 43 mL/min — AB (ref >60)
Glucose, Bld: 99 mg/dL (ref 65–99)
POTASSIUM: 4.2 mmol/L (ref 3.5–5.1)
SODIUM: 136 mmol/L (ref 135–145)
Total Bilirubin: 1.2 mg/dL (ref 0.3–1.2)
Total Protein: 6.6 g/dL (ref 6.5–8.1)

## 2016-12-10 LAB — PROTIME-INR
INR: 0.98
PROTHROMBIN TIME: 13 s (ref 11.4–15.2)

## 2016-12-10 MED ORDER — SACUBITRIL-VALSARTAN 24-26 MG PO TABS
1.0000 | ORAL_TABLET | Freq: Two times a day (BID) | ORAL | Status: DC
  Administered 2016-12-10 – 2016-12-12 (×5): 1 via ORAL
  Filled 2016-12-10 (×5): qty 1

## 2016-12-10 MED ORDER — OMEGA-3-ACID ETHYL ESTERS 1 G PO CAPS
1.0000 g | ORAL_CAPSULE | Freq: Two times a day (BID) | ORAL | Status: DC
  Administered 2016-12-10 – 2016-12-12 (×5): 1 g via ORAL
  Filled 2016-12-10 (×5): qty 1

## 2016-12-10 MED ORDER — ONDANSETRON HCL 4 MG PO TABS: 4.0000 mg | ORAL_TABLET | Freq: Four times a day (QID) | ORAL | Status: DC | PRN

## 2016-12-10 MED ORDER — ASPIRIN EC 81 MG PO TBEC
81.0000 mg | DELAYED_RELEASE_TABLET | Freq: Every day | ORAL | Status: DC
  Administered 2016-12-11: 81 mg via ORAL
  Filled 2016-12-10: qty 1

## 2016-12-10 MED ORDER — ASPIRIN 81 MG PO CHEW
324.0000 mg | CHEWABLE_TABLET | Freq: Once | ORAL | Status: AC
  Administered 2016-12-10: 324 mg via ORAL
  Filled 2016-12-10: qty 4

## 2016-12-10 MED ORDER — POTASSIUM CHLORIDE CRYS ER 20 MEQ PO TBCR
20.0000 meq | EXTENDED_RELEASE_TABLET | Freq: Every day | ORAL | Status: DC
  Administered 2016-12-10 – 2016-12-12 (×3): 20 meq via ORAL
  Filled 2016-12-10 (×3): qty 1

## 2016-12-10 MED ORDER — ATORVASTATIN CALCIUM 20 MG PO TABS
40.0000 mg | ORAL_TABLET | Freq: Every day | ORAL | Status: DC
  Administered 2016-12-10 – 2016-12-11 (×2): 40 mg via ORAL
  Filled 2016-12-10 (×2): qty 2

## 2016-12-10 MED ORDER — HYDRALAZINE HCL 20 MG/ML IJ SOLN
10.0000 mg | Freq: Four times a day (QID) | INTRAMUSCULAR | Status: DC | PRN
  Administered 2016-12-10: 14:00:00 10 mg via INTRAVENOUS
  Filled 2016-12-10: qty 1

## 2016-12-10 MED ORDER — ENOXAPARIN SODIUM 40 MG/0.4ML ~~LOC~~ SOLN
40.0000 mg | SUBCUTANEOUS | Status: DC
  Administered 2016-12-10 – 2016-12-11 (×2): 40 mg via SUBCUTANEOUS
  Filled 2016-12-10 (×2): qty 0.4

## 2016-12-10 MED ORDER — BUSPIRONE HCL 5 MG PO TABS
5.0000 mg | ORAL_TABLET | Freq: Two times a day (BID) | ORAL | Status: DC
  Administered 2016-12-10 – 2016-12-12 (×5): 5 mg via ORAL
  Filled 2016-12-10 (×5): qty 1

## 2016-12-10 MED ORDER — ACETAMINOPHEN 325 MG PO TABS: 650.0000 mg | ORAL_TABLET | Freq: Four times a day (QID) | ORAL | Status: DC | PRN

## 2016-12-10 MED ORDER — ACETAMINOPHEN 650 MG RE SUPP: 650.0000 mg | Freq: Four times a day (QID) | RECTAL | Status: DC | PRN

## 2016-12-10 MED ORDER — MEMANTINE HCL ER 14 MG PO CP24
28.0000 mg | ORAL_CAPSULE | Freq: Every day | ORAL | Status: DC
  Administered 2016-12-10 – 2016-12-12 (×3): 28 mg via ORAL
  Filled 2016-12-10 (×3): qty 2

## 2016-12-10 MED ORDER — POLYETHYLENE GLYCOL 3350 17 G PO PACK: 17.0000 g | PACK | Freq: Every day | ORAL | Status: DC | PRN

## 2016-12-10 MED ORDER — CITALOPRAM HYDROBROMIDE 20 MG PO TABS
20.0000 mg | ORAL_TABLET | Freq: Every day | ORAL | Status: DC
  Administered 2016-12-10 – 2016-12-12 (×3): 20 mg via ORAL
  Filled 2016-12-10 (×3): qty 1

## 2016-12-10 MED ORDER — ONDANSETRON HCL 4 MG/2ML IJ SOLN: 4.0000 mg | Freq: Four times a day (QID) | INTRAMUSCULAR | Status: DC | PRN

## 2016-12-10 MED ORDER — CARVEDILOL 3.125 MG PO TABS
3.1250 mg | ORAL_TABLET | Freq: Two times a day (BID) | ORAL | Status: DC
  Administered 2016-12-10 – 2016-12-12 (×4): 3.125 mg via ORAL
  Filled 2016-12-10 (×4): qty 1

## 2016-12-10 MED ORDER — FUROSEMIDE 20 MG PO TABS
20.0000 mg | ORAL_TABLET | Freq: Every day | ORAL | Status: DC
  Administered 2016-12-10 – 2016-12-12 (×3): 20 mg via ORAL
  Filled 2016-12-10 (×3): qty 1

## 2016-12-10 MED ORDER — SODIUM CHLORIDE 0.9% FLUSH
3.0000 mL | Freq: Two times a day (BID) | INTRAVENOUS | Status: DC
  Administered 2016-12-10 – 2016-12-12 (×5): 3 mL via INTRAVENOUS

## 2016-12-10 NOTE — H&P (Signed)
[place] [facility] [place] NAME: Karen Collins    MR#:  [id number]  DATE OF BIRTH:  02/19/1934  DATE OF ADMISSION:  12/10/2016  PRIMARY CARE PHYSICIAN: [name], MD   REQUESTING/REFERRING PHYSICIAN: Dr. Kerman Passey  CHIEF COMPLAINT:   Chief Complaint  Patient presents with  . Altered Mental Status    HISTORY OF PRESENT ILLNESS:  Karen Collins  is a 83 y.o. female with a known history of HTN, systolic chf, mild dementia and lives alone called her dentist office and was not making sense. They called 911 and EMS found her confused with dysarthria and RUE weakness. Last well known 5 PM. Here in ED CT head showed nothing acute. Not tPA candidate. Admit to hospitalist service.   PAST MEDICAL HISTORY:   Past Medical History:  Diagnosis Date  . AAA (abdominal aortic aneurysm) ([place])    s/p stent   . Anxiety   . Arthritis   . CAD (coronary artery disease)   . Chronic kidney disease (CKD), stage III (moderate)   . Chronic systolic congestive heart failure (HCC)    EF of 35 %   . Dementia   . Depression   . Diverticulitis    H/O  . GERD (gastroesophageal reflux disease)   . GI bleed   . Hx of migraines   . Hx: UTI (urinary tract infection)   . Hyperlipidemia   . Hypertension   . Kidney stones   . MI (myocardial infarction)    Non-ST elevation MI  . Renal cell carcinoma   . Thyroid disease     PAST SURGICAL HISTORY:   Past Surgical History:  Procedure Laterality Date  . ABDOMINAL AORTIC ANEURYSM REPAIR     s/p stent  . ABDOMINAL AORTIC ENDOVASCULAR STENT GRAFT    . CARDIAC CATHETERIZATION    . CARDIAC CATHETERIZATION  09/18/2010  . CHOLECYSTECTOMY    . CORONARY STENT PLACEMENT  09/18/2010   CAD; MI s/p stent  . LAPAROSCOPIC PARTIAL NEPHRECTOMY  2010  . NEPHRECTOMY  10/2008   Right  . PARATHYROIDECTOMY    . SHOULDER SURGERY  07/2010   Right  . THYROIDECTOMY      SOCIAL HISTORY:   Social History  Substance  Use Topics  . Smoking status: Former Smoker    Packs/day: 1.00    Years: 30.00    Quit date: 10/01/2005  . Smokeless tobacco: Never Used  . Alcohol use 4.2 oz/week    4 Glasses of wine, 3 Standard drinks or equivalent per week     Comment: Wine 3-4 times a week     FAMILY HISTORY:   Family History  Problem Relation Age of Onset  . Arthritis Mother   . Heart disease Mother   . Pulmonary fibrosis Mother   . Heart disease Father   . Hodgkin's lymphoma Father   . Breast cancer Sister   . Hyperlipidemia Sister     DRUG ALLERGIES:   Allergies  Allergen Reactions  . Tramadol Rash    REVIEW OF SYSTEMS:   Review of Systems  Unable to perform ROS: Dementia    MEDICATIONS AT HOME:   Prior to Admission medications   Medication Sig Start Date End Date Taking? Authorizing Provider  acetaminophen (TYLENOL) 500 MG tablet Take 325 mg by mouth as needed for mild pain or fever.     Historical Provider, MD  aspirin EC 81 MG tablet Take 81 mg by mouth daily.    Historical Provider,  MD  busPIRone (BUSPAR) 5 MG tablet Take 1 tablet (5 mg total) by mouth 2 (two) times daily. 06/21/16   Rainey Pines, MD  calcium carbonate (CALCIUM 600) 600 MG TABS Take 600 mg by mouth daily.      Historical Provider, MD  carvedilol (COREG) 3.125 MG tablet TAKE ONE TABLET TWICE DAILY WITH A MEAL 08/03/16   [name], MD  cetirizine (ZYRTEC) 10 MG tablet Take 10 mg by mouth daily as needed for allergies.     Historical Provider, MD  citalopram (CELEXA) 20 MG tablet Take 1 tablet (20 mg total) by mouth daily. 06/21/16   Rainey Pines, MD  furosemide (LASIX) 20 MG tablet TAKE ONE (1) TABLET EACH DAY 09/03/16   [name], MD  ibuprofen (ADVIL,MOTRIN) 600 MG tablet Take 600 mg by mouth as needed.    Historical Provider, MD  Multiple Vitamin (MULTIVITAMIN) tablet Take 1 tablet by mouth daily.      Historical Provider, MD  NAMENDA XR 28 MG CP24 24 hr capsule TAKE ONE (1) CAPSULE EACH DAY 10/09/16   Rainey Pines, MD   nitroGLYCERIN (NITROSTAT) 0.4 MG SL tablet Place 1 tablet (0.4 mg total) under the tongue every 5 (five) minutes as needed. 05/25/16   Alisa Graff, FNP  Omega-3 Fatty Acids (FISH OIL) 1200 MG CAPS Take 1,200 mg by mouth daily.    Historical Provider, MD  potassium chloride SA (K-DUR,KLOR-CON) 20 MEQ tablet TAKE ONE (1) TABLET EACH DAY 09/13/16   [name], MD  sacubitril-valsartan (ENTRESTO) 24-26 MG Take 1 tablet by mouth 2 (two) times daily. 06/26/16   Alisa Graff, FNP  simvastatin (ZOCOR) 40 MG tablet TAKE ONE TABLET BY MOUTH EVERY NIGHT AT BEDTIME 08/27/16   [name], MD  traZODone (DESYREL) 50 MG tablet Take 1 tablet (50 mg total) by mouth at bedtime as needed for sleep. 06/21/16   Rainey Pines, MD     VITAL SIGNS:  Blood pressure (!) 185/101, pulse 84, temperature 98.2 F (36.8 C), resp. rate 18, height 5\' 7"  (1.702 m), weight 62.1 kg (137 lb), SpO2 100 %.  PHYSICAL EXAMINATION:  Physical Exam  GENERAL:  83 y.o.-year-old patient lying in the bed with no acute distress.  EYES: Pupils equal, round, reactive to light and accommodation. No scleral icterus. Extraocular muscles intact.  HEENT: Head atraumatic, normocephalic. Oropharynx and nasopharynx clear. No oropharyngeal erythema, moist oral mucosa  NECK:  Supple, no jugular venous distention. No thyroid enlargement, no tenderness.  LUNGS: Normal breath sounds bilaterally, no wheezing, rales, rhonchi. No use of accessory muscles of respiration.  CARDIOVASCULAR: S1, S2 normal. No murmurs, rubs, or gallops.  ABDOMEN: Soft, nontender, nondistended. Bowel sounds present. No organomegaly or mass.  EXTREMITIES: No pedal edema, cyanosis, or clubbing. + 2 pedal & radial pulses b/l.   NEUROLOGIC: Cranial nerves II through XII are intact. No focal Motor or sensory deficits appreciated b/l. Right facial droop PSYCHIATRIC: The patient is alert and awake. Not oriented to time or place SKIN: No obvious rash, lesion, or ulcer.    LABORATORY PANEL:   CBC  Recent Labs Lab 12/10/16 1112  WBC 4.2  HGB 15.2  HCT 44.4  PLT 225   ------------------------------------------------------------------------------------------------------------------  Chemistries   Recent Labs Lab 12/10/16 1112  NA 136  K 4.2  CL 105  CO2 24  GLUCOSE 99  BUN 11  CREATININE 1.16*  CALCIUM 9.7  AST 27  ALT 10*  ALKPHOS 69  BILITOT 1.2   ------------------------------------------------------------------------------------------------------------------  Cardiac Enzymes  Recent Labs Lab 12/10/16 1112  TROPONINI 0.03*   ------------------------------------------------------------------------------------------------------------------  RADIOLOGY:  Ct Head Wo Contrast  Result Date: 12/10/2016 CLINICAL DATA:  Confusion and dysarthria EXAM: CT HEAD WITHOUT CONTRAST TECHNIQUE: Contiguous axial images were obtained from the base of the skull through the vertex without intravenous contrast. COMPARISON:  Brain MRI January 09, 2013 FINDINGS: Brain: There is mild diffuse atrophy, stable. There is no intracranial mass, hemorrhage, extra-axial fluid collection, or midline shift. There is mild patchy small vessel disease in the centra semiovale bilaterally. Elsewhere the gray-white compartments appear normal. No acute infarct evident. Vascular: No hyperdense vessel. There are foci of atherosclerotic calcification in the carotid siphon regions. Skull: Bony calvarium appears intact. Sinuses/Orbits: There is mucosal thickening in multiple ethmoid air cells. Other visualized paranasal sinuses are clear. Visualized orbits appear symmetric bilaterally. Other: Mastoid air cells are clear. IMPRESSION: Stable mild atrophy with patchy periventricular small vessel disease. No intracranial mass, hemorrhage, or extra-axial fluid collection. No acute appearing infarct. Foci of arterial vascular calcification noted. There is mucosal thickening in multiple  ethmoid air cells bilaterally. Electronically Signed   By: Lowella Grip III M.D.   On: 12/10/2016 11:33     IMPRESSION AND PLAN:   * Acute CVA with right facial droop and dysarthria RUE weakness has resolved Pleasantly confused -Check MRI of the brain, Carotid dopplers, Echo - Start aspirin and statin. - Lovenox for DVT prophylaxis. - PT/OT/Speech consult as needed per symptoms - Neuro checks every 4 hours for 24 hours. - Consult neurology.  * HTN Continue home meds PRN meds  * Chronic systolic chf No signs of fluid overload  * Dementia Watch for inpatient delirium  * DVT prophylaxis Lovenox   All the records are reviewed and case discussed with ED provider. Management plans discussed with the patient, family and they are in agreement.  CODE STATUS: FULL CODE  TOTAL TIME TAKING CARE OF THIS PATIENT: 40 minutes.   Hillary Bow R M.D on 12/10/2016 at 12:43 PM  Between 7am to 6pm - Pager - [phone]  After 6pm go to [email or web address] - password [email or web address] [facility]  Office  [phone]  CC: Primary care physician; [name], MD  Note: This dictation was prepared with Dragon dictation along with smaller phrase technology. Any transcriptional errors that result from this process are unintentional.

## 2016-12-10 NOTE — ED Notes (Signed)
MD at bedside. 

## 2016-12-10 NOTE — ED Triage Notes (Signed)
Pt to ed with c/o confusion that was discovered this am. Per neighbor, pt was normal yesterday at 5pm.  Pt alert but not oriented to self, or place, or situation. Pt also speaking in incomplete sentences.

## 2016-12-10 NOTE — ED Provider Notes (Signed)
[facility] Emergency Department Provider Note  Time seen: 11:09 AM  I have reviewed the triage vital signs and the nursing notes.   HISTORY  Chief Complaint Altered Mental Status    HPI Karen Collins is a 83 y.o. female with a past medical history of anxiety, CHF, CK D, gastric reflux, hypertension, hyperlipidemia, MI, presents to the emergency department with confusion. According to EMS report the patient apparently called a dental office this morning, they became concerned because the patient was not making sense, they called 911 who came to the patient's residence and found her to be extremely confused. Patient lives alone, and is normally independent. Upon arrival patient is having significant word finding difficulty appears to have a mild right facial droop.  Past Medical History:  Diagnosis Date  . AAA (abdominal aortic aneurysm) ([place])    s/p stent   . Anxiety   . Arthritis   . CAD (coronary artery disease)   . Chronic kidney disease (CKD), stage III (moderate)   . Chronic systolic congestive heart failure (HCC)    EF of 35 %   . Depression   . Diverticulitis    H/O  . GERD (gastroesophageal reflux disease)   . GI bleed   . Hx of migraines   . Hx: UTI (urinary tract infection)   . Hyperlipidemia   . Hypertension   . Kidney stones   . MI (myocardial infarction)    Non-ST elevation MI  . Renal cell carcinoma   . Thyroid disease     Patient Active Problem List   Diagnosis Date Noted  . Chronic systolic heart failure ([place]) 05/25/2016  . HTN (hypertension) 05/25/2016  . Hypokalemia 05/10/2016  . Hypocalcemia 05/10/2016  . Health care maintenance 11/15/2014  . Urinary frequency 07/11/2014  . Leukopenia 07/11/2014  . Sleeping difficulty 03/18/2014  . Adaptive colitis 03/11/2014  . Vaginitis 08/09/2013  . Hospital discharge follow-up 07/23/2013  . Anxiety 07/22/2013  . Depression 06/01/2013  . Essential hypertension, benign  06/01/2013  . AAA (abdominal aortic aneurysm) ([place]) 06/01/2013  . Renal cell cancer ([place]) 06/01/2013  . Hyperparathyroidism ([place]) 06/01/2013  . CKD (chronic kidney disease), stage III 06/01/2013  . Memory change 06/01/2013  . Abdominal pain 06/01/2013  . History of GI bleed 06/01/2013  . Anemia, iron deficiency 06/01/2013  . Portal vein aneurysm 06/01/2013  . ISCHEMIC CARDIOMYOPATHY 11/27/2010  . CAROTID BRUIT, LEFT 11/09/2010  . Hypercholesterolemia 11/08/2010  . Coronary atherosclerosis 11/08/2010  . Carotid stenosis 11/08/2010  . GI BLEEDING 11/08/2010    Past Surgical History:  Procedure Laterality Date  . ABDOMINAL AORTIC ANEURYSM REPAIR     s/p stent  . ABDOMINAL AORTIC ENDOVASCULAR STENT GRAFT    . CARDIAC CATHETERIZATION    . CARDIAC CATHETERIZATION  09/18/2010  . CHOLECYSTECTOMY    . CORONARY STENT PLACEMENT  09/18/2010   CAD; MI s/p stent  . LAPAROSCOPIC PARTIAL NEPHRECTOMY  2010  . NEPHRECTOMY  10/2008   Right  . PARATHYROIDECTOMY    . SHOULDER SURGERY  07/2010   Right  . THYROIDECTOMY      Prior to Admission medications   Medication Sig Start Date End Date Taking? Authorizing Provider  acetaminophen (TYLENOL) 500 MG tablet Take 325 mg by mouth as needed for mild pain or fever.     Historical Provider, MD  aspirin EC 81 MG tablet Take 81 mg by mouth daily.    Historical Provider, MD  busPIRone (BUSPAR) 5 MG tablet Take 1 tablet (  5 mg total) by mouth 2 (two) times daily. 06/21/16   Rainey Pines, MD  calcium carbonate (CALCIUM 600) 600 MG TABS Take 600 mg by mouth daily.      Historical Provider, MD  carvedilol (COREG) 3.125 MG tablet TAKE ONE TABLET TWICE DAILY WITH A MEAL 08/03/16   [name], MD  cetirizine (ZYRTEC) 10 MG tablet Take 10 mg by mouth daily as needed for allergies.     Historical Provider, MD  citalopram (CELEXA) 20 MG tablet Take 1 tablet (20 mg total) by mouth daily. 06/21/16   Rainey Pines, MD  furosemide (LASIX) 20 MG tablet TAKE ONE (1)  TABLET EACH DAY 09/03/16   [name], MD  ibuprofen (ADVIL,MOTRIN) 600 MG tablet Take 600 mg by mouth as needed.    Historical Provider, MD  Multiple Vitamin (MULTIVITAMIN) tablet Take 1 tablet by mouth daily.      Historical Provider, MD  NAMENDA XR 28 MG CP24 24 hr capsule TAKE ONE (1) CAPSULE EACH DAY 10/09/16   Rainey Pines, MD  nitroGLYCERIN (NITROSTAT) 0.4 MG SL tablet Place 1 tablet (0.4 mg total) under the tongue every 5 (five) minutes as needed. 05/25/16   Alisa Graff, FNP  Omega-3 Fatty Acids (FISH OIL) 1200 MG CAPS Take 1,200 mg by mouth daily.    Historical Provider, MD  potassium chloride SA (K-DUR,KLOR-CON) 20 MEQ tablet TAKE ONE (1) TABLET EACH DAY 09/13/16   [name], MD  sacubitril-valsartan (ENTRESTO) 24-26 MG Take 1 tablet by mouth 2 (two) times daily. 06/26/16   Alisa Graff, FNP  simvastatin (ZOCOR) 40 MG tablet TAKE ONE TABLET BY MOUTH EVERY NIGHT AT BEDTIME 08/27/16   [name], MD  traZODone (DESYREL) 50 MG tablet Take 1 tablet (50 mg total) by mouth at bedtime as needed for sleep. 06/21/16   Rainey Pines, MD    Allergies  Allergen Reactions  . Tramadol Rash    Family History  Problem Relation Age of Onset  . Arthritis Mother   . Heart disease Mother   . Pulmonary fibrosis Mother   . Heart disease Father   . Hodgkin's lymphoma Father   . Breast cancer Sister   . Hyperlipidemia Sister     Social History Social History  Substance Use Topics  . Smoking status: Former Smoker    Packs/day: 1.00    Years: 30.00    Quit date: 10/01/2005  . Smokeless tobacco: Never Used  . Alcohol use 4.2 oz/week    4 Glasses of wine, 3 Standard drinks or equivalent per week     Comment: Wine 3-4 times a week     Review of Systems Constitutional: Negative for fever. Cardiovascular: Negative for chest pain. Respiratory: Negative for shortness of breath. Gastrointestinal: Negative for abdominal pain, vomiting and diarrhea. Genitourinary: Negative for  dysuria. Neurological: Negative for headaches, focal weakness or numbness. Largely negative review of systems however limited by patient's confusion and word finding difficulty.  ____________________________________________   PHYSICAL EXAM:  VITAL SIGNS: ED Triage Vitals 12/10/16 1106  Enc Vitals Group     BP (!) 181/107     Pulse Rate 85     Resp 18     Temp 98.2 F (36.8 C)     Temp Source Oral     SpO2 100 %     Weight 137 lb (62.1 kg)     Height 5\' 7"  (1.702 m)     Head Circumference      Peak Flow  Pain Score      Pain Loc      Pain Edu?      Excl. in [place]?     Constitutional: Alert. Cannot tell me her name, place, or time. Patient can answer yes no questions however her accuracy is questionable. Eyes: Normal exam ENT   Head: Normocephalic and atraumatic.   Mouth/Throat: Mucous membranes are moist. Cardiovascular: Normal rate, regular rhythm. No murmur Respiratory: Normal respiratory effort without tachypnea nor retractions. Breath sounds are clear  Gastrointestinal: Soft and nontender. No distention.  Musculoskeletal: Nontender with normal range of motion in all extremities.  Neurologic: Significant word finding difficulty. Patient has a slight right facial droop as well as a mild right pronator drift but equal grip strengths. 5/5 motor in lower extremities. Skin:  Skin is warm, dry and intact.  Psychiatric: Mood and affect are normal  ____________________________________________    EKG  EKG reviewed and interpreted by myself shows normal sinus rhythm at 86 bpm, narrow QRS, normal axis, normal intervals with nonspecific ST changes. No ST elevation.  ____________________________________________    RADIOLOGY  CT shows no acute abnormality  ____________________________________________   INITIAL IMPRESSION / ASSESSMENT AND PLAN / ED COURSE  Pertinent labs & imaging results that were available during my care of the patient were reviewed by me and  considered in my medical decision making (see chart for details).  Patient presents to the emergency department with confusion and word finding difficulty and right sided deficits on examination most consistent with CVA. Last known normal seen by family was 5 PM yesterday. We will check labs, head CT, EKG. Plan to admit to the hospital once workup is completed for CVA.  CT shows no acute abnormality. Patient continues to word finding difficulty, mild right pronator drift with mild right facial droop. Last known normal 5 PM yesterday. We'll dose aspirin in the emergency department admit to the hospital for further workup. Outside of any TPA window.  ____________________________________________   FINAL CLINICAL IMPRESSION(S) / ED DIAGNOSES  Cervical vascular accident    Harvest Dark, MD 12/10/16 1157

## 2016-12-10 NOTE — Progress Notes (Signed)
PT Cancellation Note  Patient Details Name: Karen Collins MRN: [id number] DOB: [birth year]   Cancelled Treatment:    Reason Eval/Treat Not Completed: Other (comment) (Awaiting MRI brain).  Will await results before initiating PT evaluation.    Collie Siad PT, DPT 12/10/2016, 3:09 PM

## 2016-12-10 NOTE — Progress Notes (Signed)
*  PRELIMINARY RESULTS* Echocardiogram 2D Echocardiogram has been performed.  Karen Collins 12/10/2016, 5:22 PM

## 2016-12-11 ENCOUNTER — Inpatient Hospital Stay: Payer: PPO

## 2016-12-11 DIAGNOSIS — I639 Cerebral infarction, unspecified: Secondary | ICD-10-CM

## 2016-12-11 LAB — LIPID PANEL
CHOL/HDL RATIO: 3.3 ratio
Cholesterol: 190 mg/dL (ref 0–200)
HDL: 58 mg/dL (ref >40)
LDL CALC: 114 mg/dL — AB (ref 0–99)
Triglycerides: 88 mg/dL (ref <150)
VLDL: 18 mg/dL (ref 0–40)

## 2016-12-11 MED ORDER — ASPIRIN 81 MG PO CHEW
81.0000 mg | CHEWABLE_TABLET | Freq: Every day | ORAL | Status: DC
  Administered 2016-12-12: 09:00:00 81 mg via ORAL
  Filled 2016-12-11: qty 1

## 2016-12-11 MED ORDER — ASPIRIN 81 MG PO CHEW
162.0000 mg | CHEWABLE_TABLET | Freq: Once | ORAL | Status: AC
  Administered 2016-12-11: 12:00:00 162 mg via ORAL
  Filled 2016-12-11: qty 2

## 2016-12-11 MED ORDER — ASPIRIN EC 325 MG PO TBEC: 325.0000 mg | DELAYED_RELEASE_TABLET | Freq: Every day | ORAL | Status: DC

## 2016-12-11 MED ORDER — CLOPIDOGREL BISULFATE 75 MG PO TABS
75.0000 mg | ORAL_TABLET | Freq: Every day | ORAL | Status: DC
  Administered 2016-12-11 – 2016-12-12 (×2): 75 mg via ORAL
  Filled 2016-12-11 (×2): qty 1

## 2016-12-11 NOTE — Progress Notes (Signed)
OT Cancellation Note  Patient Details Name: Karen Collins MRN: [id number] DOB: 02-06-1934   Cancelled Treatment:    Reason Eval/Treat Not Completed: Other (comment): Order received, chart reviewed, pt awaiting MRI of brain. Will hold OT evaluation at this time pending MRI results. Will continue to monitor and re-attempt OT evaluation once MRI has been completed.  Corky Sox, OTR/L 12/11/2016, 9:01 AM

## 2016-12-11 NOTE — Evaluation (Addendum)
Speech Language Pathology Evaluation Patient Details Name: Karen Collins MRN: [id number] DOB: 25-Mar-1934 Today's Date: 12/11/2016 Time: 1100-1200 SLP Time Calculation (min) (ACUTE ONLY): 60 min  Problem List:  Patient Active Problem List   Diagnosis Date Noted  . Acute CVA (cerebrovascular accident) ([place]) 12/10/2016  . Chronic systolic heart failure ([place]) 05/25/2016  . HTN (hypertension) 05/25/2016  . Hypokalemia 05/10/2016  . Hypocalcemia 05/10/2016  . Health care maintenance 11/15/2014  . Urinary frequency 07/11/2014  . Leukopenia 07/11/2014  . Sleeping difficulty 03/18/2014  . Adaptive colitis 03/11/2014  . Vaginitis 08/09/2013  . Hospital discharge follow-up 07/23/2013  . Anxiety 07/22/2013  . Depression 06/01/2013  . Essential hypertension, benign 06/01/2013  . AAA (abdominal aortic aneurysm) ([place]) 06/01/2013  . Renal cell cancer ([place]) 06/01/2013  . Hyperparathyroidism ([place]) 06/01/2013  . CKD (chronic kidney disease), stage III 06/01/2013  . Memory change 06/01/2013  . Abdominal pain 06/01/2013  . History of GI bleed 06/01/2013  . Anemia, iron deficiency 06/01/2013  . Portal vein aneurysm 06/01/2013  . ISCHEMIC CARDIOMYOPATHY 11/27/2010  . CAROTID BRUIT, LEFT 11/09/2010  . Hypercholesterolemia 11/08/2010  . Coronary atherosclerosis 11/08/2010  . Carotid stenosis 11/08/2010  . GI BLEEDING 11/08/2010   Past Medical History:  Past Medical History:  Diagnosis Date  . AAA (abdominal aortic aneurysm) ([place])    s/p stent   . Anxiety   . Arthritis   . CAD (coronary artery disease)   . Chronic kidney disease (CKD), stage III (moderate)   . Chronic systolic congestive heart failure (HCC)    EF of 35 %   . Dementia   . Depression   . Diverticulitis    H/O  . GERD (gastroesophageal reflux disease)   . GI bleed   . Hx of migraines   . Hx: UTI (urinary tract infection)   . Hyperlipidemia   . Hypertension   . Kidney stones   . MI (myocardial infarction)     Non-ST elevation MI  . Renal cell carcinoma   . Thyroid disease    Past Surgical History:  Past Surgical History:  Procedure Laterality Date  . ABDOMINAL AORTIC ANEURYSM REPAIR     s/p stent  . ABDOMINAL AORTIC ENDOVASCULAR STENT GRAFT    . CARDIAC CATHETERIZATION    . CARDIAC CATHETERIZATION  09/18/2010  . CHOLECYSTECTOMY    . CORONARY STENT PLACEMENT  09/18/2010   CAD; MI s/p stent  . LAPAROSCOPIC PARTIAL NEPHRECTOMY  2010  . NEPHRECTOMY  10/2008   Right  . PARATHYROIDECTOMY    . SHOULDER SURGERY  07/2010   Right  . THYROIDECTOMY     HPI:  Pt is a 83 y.o. female with a known history of depression, migraines, GERD, UTI, HTN, systolic chf, mild Dementia and other medical issues who lives alone called her dentist office and was not making sense. They called 911 and EMS found her confused with language deficits and RUE weakness. Currently, pt is more alert and focused able to participate in conversation but continues to have expressive language deficits c/b paraphasias and inability to write per Sister's description. Pt endorses some of the issues but states she's "fine" and appears frustrated wanting to "go home today". Suspect pt's baseline of mild Dementia may impact awareness and concerns of the situation. Pt and family deny any swallowing issues; NSG confirmed this stating pt is swallowing pills w/ water "well"; has tolerated initial (regular) meals w/ no overt s/s of aspiration reported.    Assessment / Plan / Recommendation Clinical  Impression  Pt appears to present w/ Moderate Expressive Aphasia w/ need to further assess for Receptive Aphasia. Pt also exhibits Written Expression deficits and paraphasias during Verbal Reading tasks. Pt has a baseline Dx of Mild Dementia which could be having a Cognitive-Linguistic impact.   During expressive language tasks, pt exhibited phonemic and semantic paraphasias w/ perseveration noted inconsistently. Pt was intermittently aware of her  language errors and attempted to fix them w/ further phonemic, groping errors much of the time. SLP provided phonemic cues to initiate and/or complete the word she was searching for, however, this was <50% effective. Also provided semantic cues which was not successful. During receptive language tasks, pt exhibited errors w/ more complex, lengthy tasks (ie, complex y/n questions, following 3-step commands). She was able to write her first name but unable to complete her last name (could not recall last few letters). Pt was able to read a word level from the menu to choose items for lunch. She was also able to read written numbers and follow instruction to find them on the phone keypad to dial - however, when given the verbal instruction to demonstrate follow through w/ dialing numbers on the keypad ("911", kitchen number), she was unable to dial them. Of concern was her inability to correctly dial "911" if an emergency.  Due to pt's Expressive and Receptive Aphasia, along w/ her baseline co-morbidity of Mild Dementia, would strongly recommend pt have 24 hour/7 day a week supervision initially if returning home (alone as before). This would allow time for family and following therapists to determine pt's safety awareness and problem-solving abilities w/ ADLs in her environment as well as development appropriate communication strategies. This information was discussed w/ pt, family members present in room, RN, and CM.      SLP Assessment  SLP Recommendation/Assessment: Patient needs continued Speech Lanaguage Pathology Services SLP Visit Diagnosis: Aphasia (R47.01);Cognitive communication deficit (R41.841)    Follow Up Recommendations  Skilled Nursing facility (vs 24 hour supervision/assistance)    Frequency and Duration min 3x week  2 weeks      SLP Evaluation Cognition  Overall Cognitive Status: Impaired/Different from baseline (mild Dementia at baseline though) Arousal/Alertness:  Awake/alert Orientation Level: Oriented to person;Oriented to place;Disoriented to time;Disoriented to situation Attention: Focused (easily frustrated) Focused Attention: Impaired Focused Attention Impairment: Verbal basic;Functional basic Awareness: Impaired Awareness Impairment: Anticipatory impairment Problem Solving: Impaired Problem Solving Impairment: Verbal complex;Functional complex Executive Function: Reasoning;Decision Making;Self Monitoring Reasoning: Impaired Reasoning Impairment: Verbal complex;Functional complex Decision Making: Impaired Decision Making Impairment: Verbal complex;Functional complex Self Monitoring: Impaired Self Monitoring Impairment: Verbal complex;Functional complex Behaviors: Impulsive;Poor frustration tolerance (min+) Safety/Judgment: Impaired       Comprehension  Auditory Comprehension Overall Auditory Comprehension: Impaired Yes/No Questions: Impaired (verbal complex) Commands: Impaired (3 step) Conversation: Simple Interfering Components: Attention EffectiveTechniques: Pausing;Stressing words;Extra processing time Reading Comprehension Reading Status: Impaired Word level: Impaired Sentence Level: Impaired Functional Environmental (signs, name badge): Within functional limits Effective Techniques: Verbal cueing    Expression Expression Primary Mode of Expression: Verbal Verbal Expression Overall Verbal Expression: Impaired Initiation: Impaired Automatic Speech: Name;Social Response;Counting;Day of week (MOY impaired) Level of Generative/Spontaneous Verbalization: Word;Phrase (inconsistent) Repetition: Impaired Level of Impairment: Phrase level Naming: Impairment Responsive: 26-50% accurate Confrontation: Impaired Convergent: 50-74% accurate Divergent: 25-49% accurate Verbal Errors: Semantic paraphasias;Phonemic paraphasias;Perseveration;Aware of errors;Not aware of errors (inconsistent) Pragmatics:  (easily  frustrated) Interfering Components: Attention;Premorbid deficit (Mild Dementia) Effective Techniques: Semantic cues;Phonemic cues;Sentence completion;Written cues Other Verbal Expression Comments: has attempted to write but increased errors  noted including unable to write full name(last name) Written Expression Dominant Hand: Right Written Expression: Exceptions to [facility]   Oral / Motor  Oral Motor/Sensory Function Overall Oral Motor/Sensory Function: Within functional limits Motor Speech Overall Motor Speech: Appears within functional limits for tasks assessed Respiration: Within functional limits Phonation: Normal Resonance: Within functional limits Articulation: Within functional limitis Intelligibility: Intelligible Motor Planning: Witnin functional limits Motor Speech Errors: Not applicable   GO          Functional Assessment Tool Used: clinical judgement Functional Limitations: Spoken language expressive Spoken Language Comprehension Current Status [phone]): At least 40 percent but less than 60 percent impaired, limited or restricted Spoken Language Comprehension Goal Status [phone]): At least 20 percent but less than 40 percent impaired, limited or restricted Spoken Language Comprehension Discharge Status [phone]): At least 20 percent but less than 40 percent impaired, limited or restricted Spoken Language Expression Current Status [phone]): At least 60 percent but less than 80 percent impaired, limited or restricted Spoken Language Expression Goal Status [phone]): At least 40 percent but less than 60 percent impaired, limited or restricted Spoken Language Expression Discharge Status [phone]): At least 20 percent but less than 40 percent impaired, limited or restricted          Orinda Kenner, [place], CCC-SLP Dorothia Passmore 12/11/2016, 4:20 PM

## 2016-12-11 NOTE — Evaluation (Signed)
Occupational Therapy Evaluation Patient Details Name: Karen Collins MRN: [id number] DOB: 1933-11-07 Today's Date: 12/11/2016    History of Present Illness Pt is a 83 y/o F who presented with confusion, dysarthria, and RUE weakness. MRI of the brain revealed small acute L MCA infarcts, proximal M2 stenosis and R vertebral occlusions. Pt's PMH includes mild dementia, systolic CHF, AAA, CKD, MI, R shoulder surgery.   Clinical Impression   Pt is an 83yo female with above diagnosis resulting in expressive and receptive deficits. RUE weakness has resolved as of the time of this OT evaluation. Pt lives alone and is eager to return home at [facility]. Pt was independent at baseline with ADL, driving, and spending time with a neighbor. Sisters report pt was also having some difficulty managing medications at home prior to this hospitalization, despite use of blister packs and this was a concern they had.  Pt presents with good mobility, functional independence with self care tasks, however slightly impulsive and very little insight into her cognitive deficits. Additionally, pt presents with poor safety awareness and problem solving during situational questions. Pt would benefit from 24/7 supervision upon return home after discharge for safety as well as [place] services to address noted cognitive impairments, assess the home for safety, and provide cognitive and compensatory/adaptive strategies to support functional independence, safety, and falls prevention.    Follow Up Recommendations  Home health OT;Supervision/Assistance - 24 hour    Equipment Recommendations  None recommended by OT    Recommendations for Other Services       Precautions / Restrictions Precautions Precautions: None Restrictions Weight Bearing Restrictions: No      Mobility Bed Mobility Overal bed mobility: Independent             General bed mobility comments: No physical assist or cues needed.  Transfers Overall  transfer level: Independent Equipment used: None             General transfer comment: No unsteadiness noted.  Pt independent and safe with sit<>stand transfers.    Balance Overall balance assessment: Independent;No apparent balance deficits (not formally assessed)                                     ADL Overall ADL's : Needs assistance/impaired                     Lower Body Dressing: Supervision/safety   Toilet Transfer: Supervision/safety           Functional mobility during ADLs: Supervision/safety General ADL Comments: Pt able to perform functional mobility and ADL tasks without direct assistance however slightly impulsive during mobility and decreased insight into cognitive deficits requiring supervision throughout session to maximize safety and minimize falls risk     Vision Baseline Vision/History: Wears glasses Wears Glasses: At all times Patient Visual Report: No change from baseline Vision Assessment?: Yes Eye Alignment: Within Functional Limits Ocular Range of Motion: Within Functional Limits Alignment/Gaze Preference: Within Defined Limits Tracking/Visual Pursuits: Able to track stimulus in all quads without difficulty Convergence: Impaired (comment) (decreased convergence in both eyes) Visual Fields: No apparent deficits Additional Comments: R pupil appears slightly larger than L pupil; no functional deficits noted during assessment and no double vision or balance concerns     Perception     Praxis Praxis Praxis tested?: Within functional limits    Pertinent Vitals/Pain Pain Assessment: No/denies pain  Hand Dominance Right   Extremity/Trunk Assessment Upper Extremity Assessment Upper Extremity Assessment: Overall WFL for tasks assessed   Lower Extremity Assessment Lower Extremity Assessment: Defer to PT evaluation;Overall [facility] for tasks assessed   Cervical / Trunk Assessment Cervical / Trunk Assessment: Normal    Communication Communication Communication: Receptive difficulties;Expressive difficulties   Cognition Arousal/Alertness: Awake/alert Behavior During Therapy: Anxious;Impulsive (slightly impulsive during mobility, anxious to go home) Overall Cognitive Status: Impaired/Different from baseline Area of Impairment: Orientation;Memory;Following commands;Safety/judgement;Problem solving Orientation Level: Disoriented to;Situation;Time;Person     Following Commands: Follows multi-step commands inconsistently;Follows one step commands consistently Safety/Judgement: Decreased awareness of deficits;Decreased awareness of safety   Problem Solving: Slow processing;Requires verbal cues;Requires tactile cues General Comments: Pt performs poorly with problem solving and safety awareness situational questions (e.g., reports "call next door" if there is an emergency versus calling 911), informal MOCA assessment indicating deficits with all aspects (visuospatial/executive functioning, clock drawing, naming objects, memory, attention, language, abstraction, and orientation; delayed recall not tested). Pt unable to verbalize first name but when asked to sign her name was able to do so   General Comments  Pt's two sisters present during PT evaluation.    Exercises       Shoulder Instructions      Home Living Family/patient expects to be discharged to:: Private residence Living Arrangements: Alone Available Help at Discharge: Family;Available PRN/intermittently (sisters live somewhat locally, see pt approx 1x/wk, call more frequently) Type of Home: House Home Access: Stairs to enter [employer] of Steps: 3 Entrance Stairs-Rails: Left Home Layout: One level     Bathroom Shower/Tub: Tub/shower unit Shower/tub characteristics: Architectural technologist: Standard Bathroom Accessibility: No   Home Equipment: None   Additional Comments: Every day at 5pm she spends time with her neighbor but  otherwise spends most of her time alone.       Prior Functioning/Environment Level of Independence: Independent        Comments: Denies any falls in the past 6 months; pt's sisters present for eval and report pt was having difficulty with medication mgt at home despite the use of blister packs        OT Problem List: Decreased cognition;Decreased safety awareness;Impaired vision/perception      OT Treatment/Interventions: Self-care/ADL training;Cognitive remediation/compensation;Visual/perceptual remediation/compensation;Energy conservation;Patient/family education    OT Goals(Current goals can be found in the care plan section) Acute Rehab OT Goals Patient Stated Goal: to go home OT Goal Formulation: With patient Time For Goal Achievement: 12/18/16 Potential to Achieve Goals: Good  OT Frequency: Min 1X/week   Barriers to D/C: Decreased caregiver support  pt lives alone       Co-evaluation              End of Session Equipment Utilized During Treatment: Gait belt  Activity Tolerance: Patient tolerated treatment well Patient left: in bed;with call bell/phone within reach;with family/visitor present  OT Visit Diagnosis: Cognitive communication deficit (R41.841);Other symptoms and signs involving cognitive function Symptoms and signs involving cognitive functions: Cerebral infarction                ADL either performed or assessed with clinical judgement  Time: 1455-1535 OT Time Calculation (min): 40 min Charges:  OT General Charges $OT Visit: 1 Procedure OT Evaluation $OT Eval Low Complexity: 1 Procedure OT Treatments $Self Care/Home Management : 23-37 mins G-Codes:     Jeni Salles, MPH, MS, OTR/L ascom [phone] 12/11/16, 4:07 PM

## 2016-12-11 NOTE — Consult Note (Signed)
Referring Physician: Bridgett Larsson    Chief Complaint: Confusion  HPI: SAMIA KUKLA is an 83 y.o. female with a known history of HTN, systolic CHF, mild dementia but lives alone.  Called a friend yesterday afternoon because she did not feel right.  Her friend noted that she was not making sense with conversation and EMS was called.  EMS found her confused with dysarthria and RUE weakness.    Date last known well: Date: 12/10/2016 Time last known well: Time: 10:00 per patient.  1700 12/09/2016 per chart.   tPA Given: No: Unclear LKW  Past Medical History:  Diagnosis Date  . AAA (abdominal aortic aneurysm) ([place])    s/p stent   . Anxiety   . Arthritis   . CAD (coronary artery disease)   . Chronic kidney disease (CKD), stage III (moderate)   . Chronic systolic congestive heart failure (HCC)    EF of 35 %   . Dementia   . Depression   . Diverticulitis    H/O  . GERD (gastroesophageal reflux disease)   . GI bleed   . Hx of migraines   . Hx: UTI (urinary tract infection)   . Hyperlipidemia   . Hypertension   . Kidney stones   . MI (myocardial infarction)    Non-ST elevation MI  . Renal cell carcinoma   . Thyroid disease     Past Surgical History:  Procedure Laterality Date  . ABDOMINAL AORTIC ANEURYSM REPAIR     s/p stent  . ABDOMINAL AORTIC ENDOVASCULAR STENT GRAFT    . CARDIAC CATHETERIZATION    . CARDIAC CATHETERIZATION  09/18/2010  . CHOLECYSTECTOMY    . CORONARY STENT PLACEMENT  09/18/2010   CAD; MI s/p stent  . LAPAROSCOPIC PARTIAL NEPHRECTOMY  2010  . NEPHRECTOMY  10/2008   Right  . PARATHYROIDECTOMY    . SHOULDER SURGERY  07/2010   Right  . THYROIDECTOMY      Family History  Problem Relation Age of Onset  . Arthritis Mother   . Heart disease Mother   . Pulmonary fibrosis Mother   . Heart disease Father   . Hodgkin's lymphoma Father   . Breast cancer Sister   . Hyperlipidemia Sister    Social History:  reports that she quit smoking about 11 years ago.  She has a 30.00 pack-year smoking history. She has never used smokeless tobacco. She reports that she drinks about 4.2 oz of alcohol per week . She reports that she does not use drugs.  Allergies:  Allergies  Allergen Reactions  . Tramadol Rash    Medications:  I have reviewed the patient's current medications. Prior to Admission:  Prescriptions Prior to Admission  Medication Sig Dispense Refill Last Dose  . acetaminophen (TYLENOL) 500 MG tablet Take 325 mg by mouth as needed for mild pain or fever.    prn at prn  . busPIRone (BUSPAR) 5 MG tablet Take 1 tablet (5 mg total) by mouth 2 (two) times daily. 60 tablet 1 Taking  . carvedilol (COREG) 3.125 MG tablet TAKE ONE TABLET TWICE DAILY WITH A MEAL 60 tablet 3   . cetirizine (ZYRTEC) 10 MG tablet Take 10 mg by mouth daily as needed for allergies.    prn at prn  . citalopram (CELEXA) 20 MG tablet Take 1 tablet (20 mg total) by mouth daily. 30 tablet 1 Taking  . furosemide (LASIX) 20 MG tablet TAKE ONE (1) TABLET EACH DAY 30 tablet 2   . NAMENDA XR  28 MG CP24 24 hr capsule TAKE ONE (1) CAPSULE EACH DAY 30 capsule 1   . nitroGLYCERIN (NITROSTAT) 0.4 MG SL tablet Place 1 tablet (0.4 mg total) under the tongue every 5 (five) minutes as needed. 30 tablet 1 prn at prn  . potassium chloride SA (K-DUR,KLOR-CON) 20 MEQ tablet TAKE ONE (1) TABLET EACH DAY 30 tablet 2   . sacubitril-valsartan (ENTRESTO) 24-26 MG Take 1 tablet by mouth 2 (two) times daily. 60 tablet 3   . simvastatin (ZOCOR) 40 MG tablet TAKE ONE TABLET BY MOUTH EVERY NIGHT AT BEDTIME 90 tablet 1   . traZODone (DESYREL) 50 MG tablet Take 1 tablet (50 mg total) by mouth at bedtime as needed for sleep. 30 tablet 1 Taking  . aspirin EC 81 MG tablet Take 81 mg by mouth daily.   Not Taking at Unknown time  . calcium carbonate (CALCIUM 600) 600 MG TABS Take 600 mg by mouth daily.     Not Taking at Unknown time  . ibuprofen (ADVIL,MOTRIN) 600 MG tablet Take 600 mg by mouth as needed.   Not  Taking at Unknown time  . Multiple Vitamin (MULTIVITAMIN) tablet Take 1 tablet by mouth daily.     Not Taking at Unknown time  . Omega-3 Fatty Acids (FISH OIL) 1200 MG CAPS Take 1,200 mg by mouth daily.   Not Taking at Unknown time   Scheduled: . [START ON 12/12/2016] aspirin EC  325 mg Oral Daily  . atorvastatin  40 mg Oral q1800  . busPIRone  5 mg Oral BID  . carvedilol  3.125 mg Oral BID WC  . citalopram  20 mg Oral Daily  . enoxaparin (LOVENOX) injection  40 mg Subcutaneous Q24H  . furosemide  20 mg Oral Daily  . memantine  28 mg Oral Daily  . omega-3 acid ethyl esters  1 g Oral BID  . potassium chloride SA  20 mEq Oral Daily  . sacubitril-valsartan  1 tablet Oral BID  . sodium chloride flush  3 mL Intravenous Q12H    ROS: History obtained from the patient  General ROS: negative for - chills, fatigue, fever, night sweats, weight gain or weight loss Psychological ROS: negative for - behavioral disorder, hallucinations, memory difficulties, mood swings or suicidal ideation Ophthalmic ROS: negative for - blurry vision, double vision, eye pain or loss of vision ENT ROS: negative for - epistaxis, nasal discharge, oral lesions, sore throat, tinnitus or vertigo Allergy and Immunology ROS: negative for - hives or itchy/watery eyes Hematological and Lymphatic ROS: negative for - bleeding problems, bruising or swollen lymph nodes Endocrine ROS: negative for - galactorrhea, hair pattern changes, polydipsia/polyuria or temperature intolerance Respiratory ROS: negative for - cough, hemoptysis, shortness of breath or wheezing Cardiovascular ROS: negative for - chest pain, dyspnea on exertion, edema or irregular heartbeat Gastrointestinal ROS: negative for - abdominal pain, diarrhea, hematemesis, nausea/vomiting or stool incontinence Genito-Urinary ROS: negative for - dysuria, hematuria, incontinence or urinary frequency/urgency Musculoskeletal ROS: negative for - joint swelling or muscular  weakness Neurological ROS: as noted in HPI Dermatological ROS: negative for rash and skin lesion changes  Physical Examination: Blood pressure (!) 166/68, pulse 81, temperature 98.3 F (36.8 C), temperature source Oral, resp. rate 18, height 5\' 4"  (1.626 m), weight 64.7 kg (142 lb 9.6 oz), SpO2 98 %.  HEENT-  Normocephalic, no lesions, without obvious abnormality.  Normal external eye and conjunctiva.  Normal TM's bilaterally.  Normal auditory canals and external ears. Normal external nose, mucus membranes  and septum.  Normal pharynx. Cardiovascular- S1, S2 normal, pulses palpable throughout   Lungs- chest clear, no wheezing, rales, normal symmetric air entry Abdomen- soft, non-tender; bowel sounds normal; no masses,  no organomegaly Extremities- no edema Lymph-no adenopathy palpable Musculoskeletal-no joint tenderness, deformity or swelling Skin-warm and dry, no hyperpigmentation, vitiligo, or suspicious lesions  Neurological Examination   Mental Status: Alert.  Expressive and receptive aphasia.  Follows simple commands. Cranial Nerves: II: Discs flat bilaterally; Visual fields grossly normal, pupils equal, round, reactive to light and accommodation III,IV, VI: ptosis not present, extra-ocular motions intact bilaterally V,VII: smile symmetric with mild flattening of the right NLF, facial light touch sensation normal bilaterally VIII: hearing normal bilaterally IX,X: gag reflex present XI: bilateral shoulder shrug XII: midline tongue extension Motor: Right : Upper extremity   5/5    Left:     Upper extremity   5/5  Lower extremity   5/5     Lower extremity   5/5 Tone and bulk:normal tone throughout; no atrophy noted Sensory: Pinprick and light touch intact throughout, bilaterally Deep Tendon Reflexes: 2+ and symmetric with absent AJ's bilaterally Plantars: Right: upgoing   Left: downgoing Cerebellar: Normal finger-to-nose and normal heel-to-shin testing bilaterally Gait: not  tested due to safety concerns    Laboratory Studies:  Basic Metabolic Panel:  Recent Labs Lab 12/10/16 1112  NA 136  K 4.2  CL 105  CO2 24  GLUCOSE 99  BUN 11  CREATININE 1.16*  CALCIUM 9.7    Liver Function Tests:  Recent Labs Lab 12/10/16 1112  AST 27  ALT 10*  ALKPHOS 69  BILITOT 1.2  PROT 6.6  ALBUMIN 3.9   No results for input(s): LIPASE, AMYLASE in the last 168 hours. No results for input(s): AMMONIA in the last 168 hours.  CBC:  Recent Labs Lab 12/10/16 1112  WBC 4.2  HGB 15.2  HCT 44.4  MCV 93.5  PLT 225    Cardiac Enzymes:  Recent Labs Lab 12/10/16 1112  TROPONINI 0.03*    BNP: Invalid input(s): POCBNP  CBG: No results for input(s): GLUCAP in the last 168 hours.  Microbiology: Results for orders placed or performed in visit on 07/06/14  CULTURE, URINE COMPREHENSIVE     Status: None   Collection Time: 07/06/14  2:42 PM  Result Value Ref Range Status   Colony Count NO GROWTH  Final   Organism ID, Bacteria NO GROWTH  Final    Coagulation Studies:  Recent Labs  12/10/16 1112  LABPROT 13.0  INR 0.98    Urinalysis:  Recent Labs Lab 12/10/16 1203  COLORURINE YELLOW*  LABSPEC 1.009  PHURINE 7.0  GLUCOSEU NEGATIVE  HGBUR NEGATIVE  BILIRUBINUR NEGATIVE  KETONESUR 5*  PROTEINUR NEGATIVE  NITRITE NEGATIVE  LEUKOCYTESUR NEGATIVE    Lipid Panel:    Component Value Date/Time   CHOL 190 12/11/2016 0325   CHOL 170 03/27/2012 0406   TRIG 88 12/11/2016 0325   TRIG 125 03/27/2012 0406   HDL 58 12/11/2016 0325   HDL 46 03/27/2012 0406   CHOLHDL 3.3 12/11/2016 0325   VLDL 18 12/11/2016 0325   VLDL 25 03/27/2012 0406   LDLCALC 114 (H) 12/11/2016 0325   LDLCALC 99 03/27/2012 0406    HgbA1C:  Lab Results  Component Value Date   HGBA1C 5.6 03/27/2012    Urine Drug Screen:  No results found for: LABOPIA, COCAINSCRNUR, LABBENZ, AMPHETMU, THCU, LABBARB  Alcohol Level: No results for input(s): ETH in the last 168  hours.  Imaging: Ct Head Wo Contrast  Result Date: 12/10/2016 CLINICAL DATA:  Confusion and dysarthria EXAM: CT HEAD WITHOUT CONTRAST TECHNIQUE: Contiguous axial images were obtained from the base of the skull through the vertex without intravenous contrast. COMPARISON:  Brain MRI January 09, 2013 FINDINGS: Brain: There is mild diffuse atrophy, stable. There is no intracranial mass, hemorrhage, extra-axial fluid collection, or midline shift. There is mild patchy small vessel disease in the centra semiovale bilaterally. Elsewhere the gray-white compartments appear normal. No acute infarct evident. Vascular: No hyperdense vessel. There are foci of atherosclerotic calcification in the carotid siphon regions. Skull: Bony calvarium appears intact. Sinuses/Orbits: There is mucosal thickening in multiple ethmoid air cells. Other visualized paranasal sinuses are clear. Visualized orbits appear symmetric bilaterally. Other: Mastoid air cells are clear. IMPRESSION: Stable mild atrophy with patchy periventricular small vessel disease. No intracranial mass, hemorrhage, or extra-axial fluid collection. No acute appearing infarct. Foci of arterial vascular calcification noted. There is mucosal thickening in multiple ethmoid air cells bilaterally. Electronically Signed   By: Lowella Grip III M.D.   On: 12/10/2016 11:33   Mr Jodene Nam Head Wo Contrast  Result Date: 12/11/2016 CLINICAL DATA:  New onset dysarthria and right upper extremity weakness beginning yesterday. EXAM: MRI HEAD WITHOUT CONTRAST MRA HEAD WITHOUT CONTRAST TECHNIQUE: Multiplanar, multiecho pulse sequences of the brain and surrounding structures were obtained without intravenous contrast. Angiographic images of the head were obtained using MRA technique without contrast. COMPARISON:  CT head without contrast 12/10/2016. MRI brain 01/09/2013. FINDINGS: MRI HEAD FINDINGS Brain: Scattered punctate left MCA territory acute infarcts are present. There scattered  subcortical white matter infarcts. There is also infarction involving the posterior left insular cortex and sylvian fissure. Two additional punctate foci of acute cortical infarction are present in the left parietal lobe Vascular: The right vertebral artery flow void is absent. There is T1 shortening. This suggests slow or occluded flow. This may represent acute occlusion given the T1 shortening. Flow is present within the anterior circulation. Skull and upper cervical spine: The skullbase is normal. Midline sagittal structures are unremarkable. Sinuses/Orbits: Mild mucosal thickening is present in the ethmoid air cells bilaterally. The remaining paranasal sinuses are clear. There is minimal fluid in the inferior left mastoid air cells. No obstructing nasopharyngeal lesion is present. A right lens replacement is noted. The globes and orbits are otherwise intact. MRA HEAD FINDINGS The internal carotid arteries are within normal limits from the high cervical segments through the ICA termini bilaterally. Left A1 segment is dominant. Anterior communicating artery is patent. ACA branch vessels are within normal limits. The M1 segments are normal. There is a high-grade stenosis or occlusion of the superior right M2 division. Marked signal loss is present in the superior and inferior left M2 divisions proximally. More distal superior branch vessels are present. There is some artifact from patient motion at the level of the MCA bifurcations. Left vertebral artery is within normal limits. The left PICA origin is normal. The right vertebral artery is occluded. Minimal signal is present distally. A right AICA vessel is noted. The basilar artery is normal. Both posterior cerebral arteries originate from the basilar tip. Asymmetric attenuation of branch vessels is noted on the right. IMPRESSION: 1. Scattered foci of acute nonhemorrhagic infarction within the left MCA territory. 2. Acute infarcts involve the posterior left  insular cortex and sylvian fissure. 3. Proximal M2 stenoses or occlusions just be on the bifurcations, left greater than right. 4. Right vertebral artery occlusion. This may be acute. No  acute right cerebellar infarct is present. These results will be called to the ordering clinician or representative by the Radiologist Assistant, and communication documented in the PACS or zVision Dashboard. Electronically Signed   By: San Morelle M.D.   On: 12/11/2016 10:20   Mr Brain Wo Contrast  Result Date: 12/11/2016 CLINICAL DATA:  New onset dysarthria and right upper extremity weakness beginning yesterday. EXAM: MRI HEAD WITHOUT CONTRAST MRA HEAD WITHOUT CONTRAST TECHNIQUE: Multiplanar, multiecho pulse sequences of the brain and surrounding structures were obtained without intravenous contrast. Angiographic images of the head were obtained using MRA technique without contrast. COMPARISON:  CT head without contrast 12/10/2016. MRI brain 01/09/2013. FINDINGS: MRI HEAD FINDINGS Brain: Scattered punctate left MCA territory acute infarcts are present. There scattered subcortical white matter infarcts. There is also infarction involving the posterior left insular cortex and sylvian fissure. Two additional punctate foci of acute cortical infarction are present in the left parietal lobe Vascular: The right vertebral artery flow void is absent. There is T1 shortening. This suggests slow or occluded flow. This may represent acute occlusion given the T1 shortening. Flow is present within the anterior circulation. Skull and upper cervical spine: The skullbase is normal. Midline sagittal structures are unremarkable. Sinuses/Orbits: Mild mucosal thickening is present in the ethmoid air cells bilaterally. The remaining paranasal sinuses are clear. There is minimal fluid in the inferior left mastoid air cells. No obstructing nasopharyngeal lesion is present. A right lens replacement is noted. The globes and orbits are otherwise  intact. MRA HEAD FINDINGS The internal carotid arteries are within normal limits from the high cervical segments through the ICA termini bilaterally. Left A1 segment is dominant. Anterior communicating artery is patent. ACA branch vessels are within normal limits. The M1 segments are normal. There is a high-grade stenosis or occlusion of the superior right M2 division. Marked signal loss is present in the superior and inferior left M2 divisions proximally. More distal superior branch vessels are present. There is some artifact from patient motion at the level of the MCA bifurcations. Left vertebral artery is within normal limits. The left PICA origin is normal. The right vertebral artery is occluded. Minimal signal is present distally. A right AICA vessel is noted. The basilar artery is normal. Both posterior cerebral arteries originate from the basilar tip. Asymmetric attenuation of branch vessels is noted on the right. IMPRESSION: 1. Scattered foci of acute nonhemorrhagic infarction within the left MCA territory. 2. Acute infarcts involve the posterior left insular cortex and sylvian fissure. 3. Proximal M2 stenoses or occlusions just be on the bifurcations, left greater than right. 4. Right vertebral artery occlusion. This may be acute. No acute right cerebellar infarct is present. These results will be called to the ordering clinician or representative by the Radiologist Assistant, and communication documented in the PACS or zVision Dashboard. Electronically Signed   By: San Morelle M.D.   On: 12/11/2016 10:20   US Carotid Bilateral  Result Date: 12/10/2016 CLINICAL DATA:  CVA. EXAM: BILATERAL CAROTID DUPLEX ULTRASOUND TECHNIQUE: Pearline Cables scale imaging, color Doppler and duplex ultrasound were performed of bilateral carotid and vertebral arteries in the neck. COMPARISON:  CT 12/10/2016. FINDINGS: Criteria: Quantification of carotid stenosis is based on velocity parameters that correlate the residual  internal carotid diameter with NASCET-based stenosis levels, using the diameter of the distal internal carotid lumen as the denominator for stenosis measurement. The following velocity measurements were obtained: RIGHT ICA:  214/22 cm/sec CCA:  32/95 cm/sec SYSTOLIC ICA/CCA RATIO:  2.2 DIASTOLIC  ICA/CCA RATIO:  2.0 ECA:  249 cm/sec LEFT ICA:  126/23 cm/sec CCA:  382/50 cm/sec SYSTOLIC ICA/CCA RATIO:  1.1 DIASTOLIC ICA/CCA RATIO:  1.2 ECA:  179 cm/sec RIGHT CAROTID ARTERY: Right common carotid artery and right carotid bifurcation moderate atherosclerotic vascular disease. Degree of stenosis 50-69%. RIGHT VERTEBRAL ARTERY:  Patent with antegrade flow. LEFT CAROTID ARTERY: Mild to moderate left common carotid, carotid bifurcation atherosclerotic vascular disease. Degree of stenosis less than 50%. LEFT VERTEBRAL ARTERY:  Patent with antegrade flow. IMPRESSION: 1. Right common carotid and right carotid bifurcation moderate atherosclerotic vascular disease. Degree of stenosis 50-69%. 2. Mild moderate left common carotid, carotid bifurcation atherosclerotic vascular disease. Degree of stenosis less than 50%. Electronically Signed   By: Marcello Moores  Register   On: 12/10/2016 15:05    Assessment: 83 y.o. female presenting with expressive and receptive aphasia.  MRI of the brain reviewed and shows scattered small acute left MCA territory infarcts.  Proximal M2 stenoses and right vertebral occlusions noted.  Infarcts likely due to artery to artery emboli.  Patient on ASA at home.  Carotid dopplers show no evidence of hemodynamically significant stenosis on the left, right ICA 50-69%.  Echocardiogram pending.  LDL 114.  Stroke Risk Factors - hyperlipidemia and hypertension  Plan: 1. HgbA1c 2. Statin for lipid management with target LDL<70. 3. PT consult, OT consult, Speech consult 4. Prophylactic therapy-ASA 81 mg and Plavix 75mg  daily 5. Telemetry monitoring 6. Frequent neuro checks 7. Echocardiogram pending 8.  Follow up with neurology as an outpatient   Alexis Goodell, MD Neurology [phone] 12/11/2016, 12:18 PM

## 2016-12-11 NOTE — Care Management (Signed)
Admitted to this facility with the diagnosis of CVA. Lives alone. Son is Shanon Brow [phone]). Sisters are Jerrye Beavers [phone]) and Holley Raring [phone]). Last was at Dr. Randell Patient scott's office fall 2017. Home Health per [facility] in the past. No skilled facility. No home oxygen. [employer] information given to sisters. Prescriptions are filled at [facility]. No falls. Good appetite. Takes care of all basic activities of daily living herself, drives. Speech evaluation completed. Recommending outpatient therapy. Will need 24/7 supervision for safety.  Physical therapy evaluation completed. No outpatient therapy recommended. Will need 24/7 supervision.  Spoke with Ms. Sias and sisters at the bedside. Discussed that insurance would not pay for personal care services. Each sister stated they couldn't stay in the home for long periods of time. Gave information on personal care services,  Speech will follow in the morning. Shelbie Ammons RN MSN CCM Care Management

## 2016-12-11 NOTE — Evaluation (Signed)
Physical Therapy Evaluation Patient Details Name: Karen Collins MRN: [id number] DOB: 12-08-1933 Today's Date: 12/11/2016   History of Present Illness  Pt is a 83 y/o F who presented with confusion, dysarthria, and RUE weakness. MRI of the brain revealed small acute L MCA infarcts, proximal M2 stenosis and R vertebral occlusions. Pt's PMH includes mild dementia, systolic CHF, AAA, CKD, MI, R shoulder surgery.    Clinical Impression  Pt admitted with above diagnosis with resultant receptive and expressive difficulties and poor safety awareness.  From a physical therapy standpoint pt is at a mod I level when ascending/descending steps using rail (pt's baseline).  Otherwise she is independent with all aspects of mobility. She does not present with any mobility concerns at this time.  She will require 24/7 supervision for safety reasons upon d/c but no PT follow up needed.  PT will sign off.    Follow Up Recommendations No PT follow up;Supervision/Assistance - 24 hour (24/7 supervision for safety reasons due to impaired cognitio)    Equipment Recommendations  None recommended by PT    Recommendations for Other Services OT consult     Precautions / Restrictions Precautions Precautions: None Restrictions Weight Bearing Restrictions: No      Mobility  Bed Mobility Overal bed mobility: Independent             General bed mobility comments: No physical assist or cues needed.  Transfers Overall transfer level: Independent Equipment used: None             General transfer comment: No unsteadiness noted.  Pt independent and safe with sit<>stand transfers.  Ambulation/Gait Ambulation/Gait assistance: Independent Ambulation Distance (Feet): 300 Feet Assistive device: None Gait Pattern/deviations: WFL(Within Functional Limits)   Gait velocity interpretation: at or above normal speed for age/gender General Gait Details: No abnormalities or unsteadiness noted.  Pt remains  steady with challenges to her balance.  No physical assist or cues needed.  Stairs Stairs: Yes Stairs assistance: Modified independent (Device/Increase time) Stair Management: One rail Left;Forwards;Step to pattern;Alternating pattern Number of Stairs: 6 (x2) General stair comments: Pt holding on L railing during ascent/descent.  Alternating pattern with ascent and step to pattern during descent (pt's baseline).  No physical assist or cues needed.  Wheelchair Mobility    Modified Rankin (Stroke Patients Only)       Balance Overall balance assessment: Independent                               Standardized Balance Assessment Standardized Balance Assessment : Dynamic Gait Index   Dynamic Gait Index Level Surface: Normal Change in Gait Speed: Normal Gait with Horizontal Head Turns: Normal Gait with Vertical Head Turns: Normal Gait and Pivot Turn: Normal Step Over Obstacle: Normal Step Around Obstacles: Normal Steps: Moderate Impairment Total Score: 22       Pertinent Vitals/Pain Pain Assessment: No/denies pain    Home Living Family/patient expects to be discharged to:: Private residence Living Arrangements: Alone Available Help at Discharge: Family;Available PRN/intermittently Type of Home: House Home Access: Stairs to enter Entrance Stairs-Rails: Left Entrance Stairs-Number of Steps: 3 Home Layout: One level Home Equipment: None Additional Comments: Every day at 5pm she spends time with her neighbor but otherwise spends most of her time alone.    Prior Function Level of Independence: Independent         Comments: Denies any falls in the past 6 months.     Hand  Dominance   Dominant Hand: Right    Extremity/Trunk Assessment   Upper Extremity Assessment Upper Extremity Assessment: Defer to OT evaluation    Lower Extremity Assessment Lower Extremity Assessment: Overall WFL for tasks assessed    Cervical / Trunk Assessment Cervical /  Trunk Assessment: Normal  Communication   Communication: Receptive difficulties;Expressive difficulties  Cognition Arousal/Alertness: Awake/alert Behavior During Therapy: WFL for tasks assessed/performed;Anxious (axious about when she will go home) Overall Cognitive Status: Impaired/Different from baseline Area of Impairment: Orientation;Following commands;Safety/judgement;Problem solving;Memory Orientation Level: Disoriented to;Person;Place;Time;Situation (likely influenced by receptive and expressive aphasia)     Following Commands: Follows multi-step commands inconsistently;Follows one step commands consistently Safety/Judgement: Decreased awareness of deficits;Decreased awareness of safety   Problem Solving: Slow processing;Requires verbal cues;Requires tactile cues General Comments: Per SLP pt unable to dial correct number if she were in the need of the police.  Pt demonstrates receptive and expressive difficulties when prompted to attempt to read bulletin board in hall.  Pt unable to understand need for assist at home for safety.    General Comments General comments (skin integrity, edema, etc.): Pt scored 22/24 on the DGI indicating she has a very low risk of falling.  Pt's two sisters present during PT evaluation.    Exercises     Assessment/Plan    PT Assessment Patent does not need any further PT services  PT Problem List         PT Treatment Interventions      PT Goals (Current goals can be found in the Care Plan section)  Acute Rehab PT Goals Patient Stated Goal: to go home PT Goal Formulation: All assessment and education complete, DC therapy    Frequency     Barriers to discharge        Co-evaluation               End of Session Equipment Utilized During Treatment: Gait belt Activity Tolerance: Patient tolerated treatment well Patient left: in chair;with call bell/phone within reach;with family/visitor present (family to notify RN if they leave  room) Nurse Communication: Mobility status;Other (comment) (no chair alarm on; family in room) PT Visit Diagnosis: Unsteadiness on feet (R26.81)         Time: 3846-6599 PT Time Calculation (min) (ACUTE ONLY): 37 min   Charges:   PT Evaluation $PT Eval Low Complexity: 1 Procedure PT Treatments $Gait Training: 8-22 mins   PT G Codes:         Collie Siad PT, DPT 12/11/2016, 2:40 PM

## 2016-12-11 NOTE — Plan of Care (Signed)
Problem: Coping: Goal: Ability to verbalize positive feelings about self will improve Outcome: Not Progressing Pt frustrated with expressive aphasia.  Problem: Self-Care: Goal: Ability to communicate needs accurately will improve Outcome: Progressing Pt is learning to cope with expressive aphasia although she is still frustrated by it.  Problem: Nutrition: Goal: Risk of aspiration will decrease Outcome: Progressing Pt swallowing well

## 2016-12-11 NOTE — Progress Notes (Addendum)
[facility] [place] NAME: Karen Collins    MR#:  [id number]  DATE OF BIRTH:  May 20, 1934  SUBJECTIVE:  CHIEF COMPLAINT:   Chief Complaint  Patient presents with  . Altered Mental Status   expressive and receptive aphasia REVIEW OF SYSTEMS:  Review of Systems  Unable to perform ROS: Dementia    DRUG ALLERGIES:   Allergies  Allergen Reactions  . Tramadol Rash   VITALS:  Blood pressure (!) 166/68, pulse 81, temperature 98.3 F (36.8 C), temperature source Oral, resp. rate 18, height 5\' 4"  (1.626 m), weight 142 lb 9.6 oz (64.7 kg), SpO2 98 %. PHYSICAL EXAMINATION:  Physical Exam  Constitutional: She is well-developed, well-nourished, and in no distress.  HENT:  Head: Normocephalic.  Mouth/Throat: Oropharynx is clear and moist.  Eyes: Conjunctivae and EOM are normal. Pupils are equal, round, and reactive to light. No scleral icterus.  Neck: Normal range of motion. Neck supple. No JVD present. No tracheal deviation present. No thyromegaly present.  Cardiovascular: Normal rate, regular rhythm and normal heart sounds.  Exam reveals no gallop.   No murmur heard. Pulmonary/Chest: Effort normal and breath sounds normal. No respiratory distress. She has no wheezes. She has no rales.  Abdominal: Soft. Bowel sounds are normal. She exhibits no distension. There is no tenderness.  Musculoskeletal: Normal range of motion. She exhibits no edema or tenderness.  Neurological: She is alert.  expressive and receptive aphasia  Skin: No rash noted. No erythema.  Psychiatric:  Demented   LABORATORY PANEL:  Female CBC  Recent Labs Lab 12/10/16 1112  WBC 4.2  HGB 15.2  HCT 44.4  PLT 225   ------------------------------------------------------------------------------------------------------------------ Chemistries   Recent Labs Lab 12/10/16 1112  NA 136  K 4.2  CL 105  CO2 24  GLUCOSE 99  BUN 11  CREATININE 1.16*  CALCIUM 9.7    AST 27  ALT 10*  ALKPHOS 69  BILITOT 1.2   RADIOLOGY:  Mr Virgel Paling Wo Contrast  Result Date: 12/11/2016 CLINICAL DATA:  New onset dysarthria and right upper extremity weakness beginning yesterday. EXAM: MRI HEAD WITHOUT CONTRAST MRA HEAD WITHOUT CONTRAST TECHNIQUE: Multiplanar, multiecho pulse sequences of the brain and surrounding structures were obtained without intravenous contrast. Angiographic images of the head were obtained using MRA technique without contrast. COMPARISON:  CT head without contrast 12/10/2016. MRI brain 01/09/2013. FINDINGS: MRI HEAD FINDINGS Brain: Scattered punctate left MCA territory acute infarcts are present. There scattered subcortical white matter infarcts. There is also infarction involving the posterior left insular cortex and sylvian fissure. Two additional punctate foci of acute cortical infarction are present in the left parietal lobe Vascular: The right vertebral artery flow void is absent. There is T1 shortening. This suggests slow or occluded flow. This may represent acute occlusion given the T1 shortening. Flow is present within the anterior circulation. Skull and upper cervical spine: The skullbase is normal. Midline sagittal structures are unremarkable. Sinuses/Orbits: Mild mucosal thickening is present in the ethmoid air cells bilaterally. The remaining paranasal sinuses are clear. There is minimal fluid in the inferior left mastoid air cells. No obstructing nasopharyngeal lesion is present. A right lens replacement is noted. The globes and orbits are otherwise intact. MRA HEAD FINDINGS The internal carotid arteries are within normal limits from the high cervical segments through the ICA termini bilaterally. Left A1 segment is dominant. Anterior communicating artery is patent. ACA branch vessels are within normal limits. The M1 segments are normal. There  is a high-grade stenosis or occlusion of the superior right M2 division. Marked signal loss is present in the  superior and inferior left M2 divisions proximally. More distal superior branch vessels are present. There is some artifact from patient motion at the level of the MCA bifurcations. Left vertebral artery is within normal limits. The left PICA origin is normal. The right vertebral artery is occluded. Minimal signal is present distally. A right AICA vessel is noted. The basilar artery is normal. Both posterior cerebral arteries originate from the basilar tip. Asymmetric attenuation of branch vessels is noted on the right. IMPRESSION: 1. Scattered foci of acute nonhemorrhagic infarction within the left MCA territory. 2. Acute infarcts involve the posterior left insular cortex and sylvian fissure. 3. Proximal M2 stenoses or occlusions just be on the bifurcations, left greater than right. 4. Right vertebral artery occlusion. This may be acute. No acute right cerebellar infarct is present. These results will be called to the ordering clinician or representative by the Radiologist Assistant, and communication documented in the PACS or zVision Dashboard. Electronically Signed   By: San Morelle M.D.   On: 12/11/2016 10:20   Mr Brain Wo Contrast  Result Date: 12/11/2016 CLINICAL DATA:  New onset dysarthria and right upper extremity weakness beginning yesterday. EXAM: MRI HEAD WITHOUT CONTRAST MRA HEAD WITHOUT CONTRAST TECHNIQUE: Multiplanar, multiecho pulse sequences of the brain and surrounding structures were obtained without intravenous contrast. Angiographic images of the head were obtained using MRA technique without contrast. COMPARISON:  CT head without contrast 12/10/2016. MRI brain 01/09/2013. FINDINGS: MRI HEAD FINDINGS Brain: Scattered punctate left MCA territory acute infarcts are present. There scattered subcortical white matter infarcts. There is also infarction involving the posterior left insular cortex and sylvian fissure. Two additional punctate foci of acute cortical infarction are present in  the left parietal lobe Vascular: The right vertebral artery flow void is absent. There is T1 shortening. This suggests slow or occluded flow. This may represent acute occlusion given the T1 shortening. Flow is present within the anterior circulation. Skull and upper cervical spine: The skullbase is normal. Midline sagittal structures are unremarkable. Sinuses/Orbits: Mild mucosal thickening is present in the ethmoid air cells bilaterally. The remaining paranasal sinuses are clear. There is minimal fluid in the inferior left mastoid air cells. No obstructing nasopharyngeal lesion is present. A right lens replacement is noted. The globes and orbits are otherwise intact. MRA HEAD FINDINGS The internal carotid arteries are within normal limits from the high cervical segments through the ICA termini bilaterally. Left A1 segment is dominant. Anterior communicating artery is patent. ACA branch vessels are within normal limits. The M1 segments are normal. There is a high-grade stenosis or occlusion of the superior right M2 division. Marked signal loss is present in the superior and inferior left M2 divisions proximally. More distal superior branch vessels are present. There is some artifact from patient motion at the level of the MCA bifurcations. Left vertebral artery is within normal limits. The left PICA origin is normal. The right vertebral artery is occluded. Minimal signal is present distally. A right AICA vessel is noted. The basilar artery is normal. Both posterior cerebral arteries originate from the basilar tip. Asymmetric attenuation of branch vessels is noted on the right. IMPRESSION: 1. Scattered foci of acute nonhemorrhagic infarction within the left MCA territory. 2. Acute infarcts involve the posterior left insular cortex and sylvian fissure. 3. Proximal M2 stenoses or occlusions just be on the bifurcations, left greater than right. 4. Right vertebral  artery occlusion. This may be acute. No acute right  cerebellar infarct is present. These results will be called to the ordering clinician or representative by the Radiologist Assistant, and communication documented in the PACS or zVision Dashboard. Electronically Signed   By: San Morelle M.D.   On: 12/11/2016 10:20   ASSESSMENT AND PLAN:   * Acute CVA with right facial droop and dysarthria RUE weakness has resolved Pleasantly confused -Check MRI of the brain, Carotid dopplers: Bilateral carotid stenosis Degree of stenosis 50-69% on right side and less than 50% on left side. Echo: Pending. - Started aspirin and statin. Start Plavix per Dr. Doy Mince. - PT/OT, - Neuro checks every 4 hours for 24 hours.  * CAD. Continue aspirin, Plavix and Lipitor. * HLP, LDL 114, on Lipitor. * HTN Continue home meds PRN meds  * Chronic systolic chf EF 93%. No signs of fluid overload  * Dementia Precaution.  I discussed with Dr. Doy Mince. All the records are reviewed and case discussed with Care Management/Social Worker. Management plans discussed with the patient's sister and daughter and they are in agreement.  CODE STATUS: Full Code  TOTAL TIME TAKING CARE OF THIS PATIENT: 43 minutes.   More than 50% of the time was spent in counseling/coordination of care: YES  POSSIBLE D/C IN 2 DAYS, DEPENDING ON CLINICAL CONDITION.   Demetrios Loll M.D on 12/11/2016 at 3:33 PM  Between 7am to 6pm - Pager - [phone]  After 6pm go to [email or web address] - Proofreader  [facility]  Office  [phone]  CC: Primary care physician; [name], MD  Note: This dictation was prepared with Dragon dictation along with smaller phrase technology. Any transcriptional errors that result from this process are unintentional.

## 2016-12-11 NOTE — Progress Notes (Signed)
PT Cancellation Note  Patient Details Name: Karen Collins MRN: [id number] DOB: 1934/03/06   Cancelled Treatment:    Reason Eval/Treat Not Completed: Other (comment).  Attempted to see pt x2 this morning.  Pt meeting with Neurologist on first attempt and working with SLP on second attempt. Will return to see pt again this afternoon, schedule permitting.  Collie Siad PT, DPT 12/11/2016, 11:44 AM

## 2016-12-12 ENCOUNTER — Telehealth: Payer: Self-pay | Admitting: *Deleted

## 2016-12-12 LAB — HEMOGLOBIN A1C
HEMOGLOBIN A1C: 5.1 % (ref 4.8–5.6)
MEAN PLASMA GLUCOSE: 100 mg/dL

## 2016-12-12 MED ORDER — CLOPIDOGREL BISULFATE 75 MG PO TABS: 75.0000 mg | ORAL_TABLET | Freq: Every day | ORAL | 2 refills | Status: DC

## 2016-12-12 MED ORDER — ATORVASTATIN CALCIUM 40 MG PO TABS: 40.0000 mg | ORAL_TABLET | Freq: Every day | ORAL | 2 refills | Status: DC

## 2016-12-12 NOTE — Telephone Encounter (Signed)
I blocked a spot on 12/21/16 @11am  with pt name

## 2016-12-12 NOTE — Progress Notes (Signed)
Speech Language Pathology Treatment: Cognitive-Linquistic  Patient Details Name: Karen Collins MRN: [id number] DOB: 04/08/1934 Today's Date: 12/12/2016 Time: 9892-1194 SLP Time Calculation (min) (ACUTE ONLY): 55 min  Assessment / Plan / Recommendation Clinical Impression  Pt continues to present w/ Moderate Expressive and Receptive Aphasia w/ need to further assess the Receptive Aphasia deficits more carefully. Pt also exhibits Written Expression deficits but is abel to Read at word level. Pt has a baseline Dx of Mild Dementia which could be having Cognitive-Linguistic impact.   During expressive language tasks today, pt exhibited phonemic and semantic paraphasias w/ perseveration noted inconsistently. Pt was intermittently aware of her language errors and attempted to fix them w/ further phonemic, groping errors at times. There were omissions of words in sentences noted as well. SLP provided phonemic cues to initiate and/or complete the word she was searching for, however, this was not consistently effective but did add some to the  Comprehension of what pt was attempting to say. During one task of making a Grocery List, pt was unable to verbally recall/name some food/beverage items and often said "I don't know". But when she was handed a menu which listed foods and drinks (and just had food item words on it), she was able to use it as a strategy to make the Grocery List. She named 8 items and wrote the name of each w/ few letter omissions and/or error. She was able to write her first name and more letters of her last name today. She was also able to read written numbers and follow instruction to find them on the phone keypad to dial to order her lunch; however, when given the verbal instruction to demonstrate follow through w/ dialing numbers on the keypad ("911", kitchen number), she was unable to dial them correctly. Of continued concern was her inability to correctly dial "911" if an emergency.   Due to pt's Expressive and Receptive Aphasia, along w/ her baseline co-morbidity of Mild Dementia, SLP continues to strongly recommend pt have 24 hour/7 day a week supervision initially if returning home (alone). This would allow time for family and following therapists to determine pt's safety awareness and problem-solving abilities w/ ADLs in her environment as well as develop appropriate communication strategies and safety strategies. This information was discussed w/ pt, family members present in room, RN, and CM. It was reported by the Sister that a [facility] Holter would be w/ pt the first 5 days and that w/in 1 week, Med Line/Alert would be set up for pt in the home. Pt will benefit from skilled [place] to assess/identify then strengthen communication skills for ADLs. The above was discussed w/ pt, Sister in room; strategies such as writing "911" and other numbers/names large/bold in several places in the home; having pt practice saying steps to common ADLs; practicing automatic speech tasks, songs. Handouts given.    HPI HPI: Pt is a 83 y.o. female with a known history of depression, migraines, GERD, UTI, HTN, systolic chf, mild Dementia and other medical issues who lives alone called her dentist office and was not making sense. They called 911 and EMS found her confused with language deficits and RUE weakness. Currently, pt is more alert and focused able to participate in conversation but continues to have expressive language deficits c/b paraphasias and inability to write per Sister's description. Pt endorses some of the issues but states she's "fine" and appears frustrated wanting to "go home today". Suspect pt's baseline of mild Dementia may impact awareness and  concerns of the situation. Pt and family deny any swallowing issues; NSG confirmed this stating pt is swallowing pills w/ water "well"; has tolerated initial (regular) meals w/ no overt s/s of aspiration reported. Pt continues to present w/  Cognitive-Linguistic deficits impairing her communication abilities.       SLP Plan  Continue with current plan of care       Recommendations  Diet recommendations: Regular;Thin liquid (since admission - no deficits have been reported) Liquids provided via: Cup;Straw Medication Administration: Whole meds with liquid (or in a Puree for easier swallowing as needed) Supervision: Patient able to self feed;Intermittent supervision to cue for compensatory strategies Compensations: Minimize environmental distractions;Slow rate;Small sips/bites;Follow solids with liquid Postural Changes and/or Swallow Maneuvers: Seated upright 90 degrees;Upright 30-60 min after meal                General recommendations: Rehab consult ([facility] and OT services at discharge) Oral Care Recommendations: Oral care BID;Patient independent with oral care;Staff/trained caregiver to provide oral care Follow up Recommendations: 24 hour supervision/assistance;Home health SLP SLP Visit Diagnosis: Aphasia (R47.01);Cognitive communication deficit (G47.207) Plan: Continue with current plan of care       Karen Collins, [place], CCC-SLP Karen Collins 12/12/2016, 2:01 PM

## 2016-12-12 NOTE — Discharge Instructions (Signed)
[facility] appointment on December 20, 2016 at 12:00pm with [name], [place]. Please call [phone] to reschedule.    Heart healthy diet. HH with RN and OT. No driving.

## 2016-12-12 NOTE — Progress Notes (Signed)
Patient discharged home per MD order. Prescriptions given to patient. All discharge instructions given to patient and sister and all questions answered and verbalized understanding.

## 2016-12-12 NOTE — Plan of Care (Signed)
Problem: Education: Goal: Knowledge of disease or condition will improve Outcome: Progressing Limited by pt's short term memory loss and dementia  Problem: Coping: Goal: Ability to verbalize positive feelings about self will improve Outcome: Progressing Limited by expressive aphasia

## 2016-12-12 NOTE — Care Management (Signed)
Discussed home health agencies with Ms. [place] and sister Jerrye Beavers in the room. [place]. Discussed co-pays for each services. Patient agreed for one visit per each service. Floydene Flock, [facility] representative up dated. Sister Jerrye Beavers states that they have a person coming to stay in the home the next 24 hours. (private pay). Discharge to home today per Dr. Jenne Pane will transport. Shelbie Ammons RN MSN CCM Care Management

## 2016-12-12 NOTE — Telephone Encounter (Signed)
Patient will discharge from [facility] on 12/12/16, she will need to be scheduled for a HFU.

## 2016-12-12 NOTE — Discharge Summary (Signed)
[facility] NAME: Karen Collins    MR#:  [id number]  DATE OF BIRTH:  [birth year]  DATE OF ADMISSION:  12/10/2016   ADMITTING PHYSICIAN: Hillary Bow, MD  DATE OF DISCHARGE: 12/12/2016 12:50 PM  PRIMARY CARE PHYSICIAN: [name], MD   ADMISSION DIAGNOSIS:  CVA (cerebral vascular accident) ([place]) [I63.9] Cerebrovascular accident (CVA), unspecified mechanism ([place]) [I63.9] DISCHARGE DIAGNOSIS:  Active Problems:   Acute CVA (cerebrovascular accident) ([place])  SECONDARY DIAGNOSIS:   Past Medical History:  Diagnosis Date  . AAA (abdominal aortic aneurysm) ([place])    s/p stent   . Anxiety   . Arthritis   . CAD (coronary artery disease)   . Chronic kidney disease (CKD), stage III (moderate)   . Chronic systolic congestive heart failure (HCC)    EF of 35 %   . Dementia   . Depression   . Diverticulitis    H/O  . GERD (gastroesophageal reflux disease)   . GI bleed   . Hx of migraines   . Hx: UTI (urinary tract infection)   . Hyperlipidemia   . Hypertension   . Kidney stones   . MI (myocardial infarction)    Non-ST elevation MI  . Renal cell carcinoma   . Thyroid disease    HOSPITAL COURSE:   * Acute CVA with right facial droop and dysarthria RUE weakness has resolved Pleasantly confused -Check MRI of the brain, Carotid dopplers: Bilateral carotid stenosis Degree of stenosis 50-69% on right side and less than 50% on left side. Echo: LV EF: 30% -   35%. - Started aspirin and statin. Started Plavix per Dr. Doy Mince. - PT/OT suggested home health and PT OT.  * CAD. Continue aspirin, Plavix and Lipitor. * HLP, LDL 114, on Lipitor. * HTN Continue home meds PRN meds  * Chronic systolic chf EF 64%. No signs of fluid overload  * Dementia Precaution.  DISCHARGE CONDITIONS:  Stable, discharged to home with home health and OT. CONSULTS OBTAINED:  Treatment Team:  Melvenia Beam, MD Alexis Goodell,  MD DRUG ALLERGIES:   Allergies  Allergen Reactions  . Tramadol Rash   DISCHARGE MEDICATIONS:   Allergies as of 12/12/2016      Reactions   Tramadol Rash      Medication List    STOP taking these medications   ibuprofen 600 MG tablet Commonly known as:  ADVIL,MOTRIN   simvastatin 40 MG tablet Commonly known as:  ZOCOR     TAKE these medications   acetaminophen 500 MG tablet Commonly known as:  TYLENOL Take 325 mg by mouth as needed for mild pain or fever.   aspirin EC 81 MG tablet Take 81 mg by mouth daily.   atorvastatin 40 MG tablet Commonly known as:  LIPITOR Take 1 tablet (40 mg total) by mouth daily at 6 PM.   busPIRone 5 MG tablet Commonly known as:  BUSPAR Take 1 tablet (5 mg total) by mouth 2 (two) times daily.   CALCIUM 600 600 MG Tabs tablet Generic drug:  calcium carbonate Take 600 mg by mouth daily.   carvedilol 3.125 MG tablet Commonly known as:  COREG TAKE ONE TABLET TWICE DAILY WITH A MEAL   cetirizine 10 MG tablet Commonly known as:  ZYRTEC Take 10 mg by mouth daily as needed for allergies.   citalopram 20 MG tablet Commonly known as:  CELEXA Take 1 tablet (20 mg total) by mouth daily.   clopidogrel 75  MG tablet Commonly known as:  PLAVIX Take 1 tablet (75 mg total) by mouth daily. Start taking on:  12/13/2016   Fish Oil 1200 MG Caps Take 1,200 mg by mouth daily.   furosemide 20 MG tablet Commonly known as:  LASIX TAKE ONE (1) TABLET EACH DAY   multivitamin tablet Take 1 tablet by mouth daily.   NAMENDA XR 28 MG Cp24 24 hr capsule Generic drug:  memantine TAKE ONE (1) CAPSULE EACH DAY   nitroGLYCERIN 0.4 MG SL tablet Commonly known as:  NITROSTAT Place 1 tablet (0.4 mg total) under the tongue every 5 (five) minutes as needed.   potassium chloride SA 20 MEQ tablet Commonly known as:  K-DUR,KLOR-CON TAKE ONE (1) TABLET EACH DAY   sacubitril-valsartan 24-26 MG Commonly known as:  ENTRESTO Take 1 tablet by mouth 2 (two)  times daily.   traZODone 50 MG tablet Commonly known as:  DESYREL Take 1 tablet (50 mg total) by mouth at bedtime as needed for sleep.        DISCHARGE INSTRUCTIONS:  See AVS.  If you experience worsening of your admission symptoms, develop shortness of breath, life threatening emergency, suicidal or homicidal thoughts you must seek medical attention immediately by calling 911 or calling your MD immediately  if symptoms less severe.  You Must read complete instructions/literature along with all the possible adverse reactions/side effects for all the Medicines you take and that have been prescribed to you. Take any new Medicines after you have completely understood and accpet all the possible adverse reactions/side effects.   Please note  You were cared for by a hospitalist during your hospital stay. If you have any questions about your discharge medications or the care you received while you were in the hospital after you are discharged, you can call the unit and asked to speak with the hospitalist on call if the hospitalist that took care of you is not available. Once you are discharged, your primary care physician will handle any further medical issues. Please note that NO REFILLS for any discharge medications will be authorized once you are discharged, as it is imperative that you return to your primary care physician (or establish a relationship with a primary care physician if you do not have one) for your aftercare needs so that they can reassess your need for medications and monitor your lab values.    On the day of Discharge:  VITAL SIGNS:  Blood pressure (!) 143/65, pulse 83, temperature 99 F (37.2 C), temperature source Oral, resp. rate 18, height 5\' 4"  (1.626 m), weight 142 lb 9.6 oz (64.7 kg), SpO2 99 %. PHYSICAL EXAMINATION:  GENERAL:  83 y.o.-year-old patient lying in the bed with no acute distress.  EYES: Pupils equal, round, reactive to light and accommodation. No  scleral icterus. Extraocular muscles intact.  HEENT: Head atraumatic, normocephalic. Oropharynx and nasopharynx clear.  NECK:  Supple, no jugular venous distention. No thyroid enlargement, no tenderness.  LUNGS: Normal breath sounds bilaterally, no wheezing, rales,rhonchi or crepitation. No use of accessory muscles of respiration.  CARDIOVASCULAR: S1, S2 normal. No murmurs, rubs, or gallops.  ABDOMEN: Soft, non-tender, non-distended. Bowel sounds present. No organomegaly or mass.  EXTREMITIES: No pedal edema, cyanosis, or clubbing.  NEUROLOGIC: Cranial nerves II through XII are intact. Muscle strength 5/5 in all extremities. Sensation intact. Gait not checked.  PSYCHIATRIC: The patient is alert and oriented x 3.  SKIN: No obvious rash, lesion, or ulcer.  DATA REVIEW:   CBC  Recent  Labs Lab 12/10/16 1112  WBC 4.2  HGB 15.2  HCT 44.4  PLT 225    Chemistries   Recent Labs Lab 12/10/16 1112  NA 136  K 4.2  CL 105  CO2 24  GLUCOSE 99  BUN 11  CREATININE 1.16*  CALCIUM 9.7  AST 27  ALT 10*  ALKPHOS 64  BILITOT 1.2     Microbiology Results  Results for orders placed or performed in visit on 07/06/14  CULTURE, URINE COMPREHENSIVE     Status: None   Collection Time: 07/06/14  2:42 PM  Result Value Ref Range Status   Colony Count NO GROWTH  Final   Organism ID, Bacteria NO GROWTH  Final    RADIOLOGY:  No results found.   Management plans discussed with the patient, family and they are in agreement.  CODE STATUS: Prior   TOTAL TIME TAKING CARE OF THIS PATIENT: 33 minutes.    Demetrios Loll M.D on 12/12/2016 at 5:05 PM  Between 7am to 6pm - Pager - [phone]  After 6pm go to [email or web address] - Proofreader  [facility]  Office  [phone]  CC: Primary care physician; [name], MD   Note: This dictation was prepared with Dragon dictation along with smaller phrase technology. Any transcriptional errors that result from  this process are unintentional.

## 2016-12-12 NOTE — Care Management Important Message (Signed)
Important Message  Patient Details  Name: CHARNELLE BERGEMAN MRN: [id number] Date of Birth: 31-Oct-1933   Medicare Important Message Given:  Yes    Shelbie Ammons, RN 12/12/2016, 8:44 AM

## 2016-12-12 NOTE — Telephone Encounter (Signed)
Are you scheduling still ? Please advise, thanks?

## 2016-12-14 ENCOUNTER — Telehealth: Payer: Self-pay | Admitting: *Deleted

## 2016-12-14 NOTE — Telephone Encounter (Signed)
Mendel Ryder from [facility] has reported that patient could not be admitted with [facility], pt requires skilled care or assistant living. Contact Mendel Ryder [phone]

## 2016-12-14 NOTE — Telephone Encounter (Signed)
Transition Care Management Follow-up Telephone Call  How have you been since you were released from the hospital? Per son patient has done well since being home.   Do you understand why you were in the hospital? Yes, CVA   Do you understand the discharge instrcutions? Yes,   Items Reviewed:  Medications reviewed:yes.Patient taking potassium, but is taking Furosemide. Patient son is not sure of medications he is only administering medication prepared in packet by pharmacy , he will bring medications to appointment.  Allergies reviewed: yes  Dietary changes reviewed: Yes Referrals reviewed: Patient not aware of any follow up other than to see PCP.  Functional Questionnaire:   Activities of Daily Living (ADLs):   She states they are independent in the following: Patient independent with dressing,bathing, toilet habits. States they require assistance with the following: Needs supervision with meals.   Any transportation issues/concerns?: Yes, patient cannot drive , and son will be returning to [place].   Any patient concerns? Yes , Patient per son is no longer feels his mother should be left  Alone and whishes for her to return to [place] with family.   Confirmed importance and date/time of follow-up visits scheduled: Yes   Confirmed with patient if condition begins to worsen call PCP or go to the ER.  Patient was given the Call-a-Nurse line [phone]: Yes.

## 2016-12-14 NOTE — Telephone Encounter (Signed)
See if they can come in at 12:00 on same day.  Thanks  Put a block on the 8:00 spot.  Thanks

## 2016-12-14 NOTE — Telephone Encounter (Signed)
Patient scheduled as requested

## 2016-12-14 NOTE — Telephone Encounter (Signed)
Medications that patient is taking per call , Atorvastatin,buspirone,carvedilol, Namenda XR, Plavix, furosemide, Entresto, and Trazodone. Just FYI.

## 2016-12-17 NOTE — Telephone Encounter (Signed)
Please advise.  (not sure what to do???) 

## 2016-12-17 NOTE — Telephone Encounter (Signed)
Need more information from [facility] as to why they cannot open her to home care.  Was just in hospital and they recommended home health.

## 2016-12-18 ENCOUNTER — Ambulatory Visit (INDEPENDENT_AMBULATORY_CARE_PROVIDER_SITE_OTHER): Payer: PPO | Admitting: Internal Medicine

## 2016-12-18 DIAGNOSIS — D509 Iron deficiency anemia, unspecified: Secondary | ICD-10-CM | POA: Diagnosis not present

## 2016-12-18 DIAGNOSIS — E7800 Pure hypercholesterolemia, unspecified: Secondary | ICD-10-CM | POA: Diagnosis not present

## 2016-12-18 DIAGNOSIS — I2510 Atherosclerotic heart disease of native coronary artery without angina pectoris: Secondary | ICD-10-CM | POA: Diagnosis not present

## 2016-12-18 DIAGNOSIS — I6529 Occlusion and stenosis of unspecified carotid artery: Secondary | ICD-10-CM | POA: Diagnosis not present

## 2016-12-18 DIAGNOSIS — R413 Other amnesia: Secondary | ICD-10-CM

## 2016-12-18 DIAGNOSIS — F419 Anxiety disorder, unspecified: Secondary | ICD-10-CM | POA: Diagnosis not present

## 2016-12-18 DIAGNOSIS — F329 Major depressive disorder, single episode, unspecified: Secondary | ICD-10-CM | POA: Diagnosis not present

## 2016-12-18 DIAGNOSIS — I714 Abdominal aortic aneurysm, without rupture, unspecified: Secondary | ICD-10-CM

## 2016-12-18 DIAGNOSIS — I639 Cerebral infarction, unspecified: Secondary | ICD-10-CM

## 2016-12-18 DIAGNOSIS — N183 Chronic kidney disease, stage 3 unspecified: Secondary | ICD-10-CM

## 2016-12-18 DIAGNOSIS — E213 Hyperparathyroidism, unspecified: Secondary | ICD-10-CM

## 2016-12-18 DIAGNOSIS — I10 Essential (primary) hypertension: Secondary | ICD-10-CM

## 2016-12-18 DIAGNOSIS — F32A Depression, unspecified: Secondary | ICD-10-CM

## 2016-12-18 NOTE — Progress Notes (Signed)
[facility]  Patient Status: not taken under care, patient's son from [place] was present when nurse went to see patient as well as her sister Joycelyn Schmid. When nurse started explaining services son asked to speak outside. Stated his mom has been confused and losing her memory for a while and since her stroke she's completely confused. He reported that all her kids live in [place] and have been trying to get her to come there but she refuses. Right now she lives at home and her siblings come check on her. It is an unsafe situation. Son reports she is not going to let us in or work with [facility] so it is useless to admit today. Son reports they are going to [place] on 3/20 and plan to discuss Options for assisted living for a safer environment. Nurse spoke with Cam Hai at Dr. Bary Leriche office of the situation and non-admit and she verbalized understanding. Notified Jeannie Fend, CM.     Florene Glen 12/18/2016, 9:44 AM

## 2016-12-18 NOTE — Progress Notes (Signed)
Patient ID: ESTEPHANIE HUBBS, female   DOB: December 04, 1933, 83 y.o.   MRN: [id number]   Subjective:    Patient ID: [name], female    DOB: May 21, 1934, 83 y.o.   MRN: [id number]  HPI  Patient here as a work in and for hospital follow up.  She is accompanied by her son and her sister.  History obtained mostly from them.  She was admitted 12/10/16 and diagnosed with CVA.  Since her discharge, her son has been staying with her.  Concern regarding her mental state.  They are concerned that she is not able to stay by herself.  Home health came out to assess her.  Did not feel she was appropriate for home health.  Felt needed assisted living or rehab.  She is not bathing.  They are making sure she is eating and taking her medications.  She now has a lifeline.  Does not know to push the button and does not know how to push the button.  She is unsteady when walking.     Past Medical History:  Diagnosis Date  . AAA (abdominal aortic aneurysm) ([place])    s/p stent   . Anxiety   . Arthritis   . CAD (coronary artery disease)   . Chronic kidney disease (CKD), stage III (moderate)   . Chronic systolic congestive heart failure (HCC)    EF of 35 %   . Dementia   . Depression   . Diverticulitis    H/O  . GERD (gastroesophageal reflux disease)   . GI bleed   . Hx of migraines   . Hx: UTI (urinary tract infection)   . Hyperlipidemia   . Hypertension   . Kidney stones   . MI (myocardial infarction)    Non-ST elevation MI  . Renal cell carcinoma   . Thyroid disease    Past Surgical History:  Procedure Laterality Date  . ABDOMINAL AORTIC ANEURYSM REPAIR     s/p stent  . ABDOMINAL AORTIC ENDOVASCULAR STENT GRAFT    . CARDIAC CATHETERIZATION    . CARDIAC CATHETERIZATION  09/18/2010  . CHOLECYSTECTOMY    . CORONARY STENT PLACEMENT  09/18/2010   CAD; MI s/p stent  . LAPAROSCOPIC PARTIAL NEPHRECTOMY  2010  . NEPHRECTOMY  10/2008   Right  . PARATHYROIDECTOMY    . SHOULDER SURGERY  07/2010     Right  . THYROIDECTOMY     Family History  Problem Relation Age of Onset  . Arthritis Mother   . Heart disease Mother   . Pulmonary fibrosis Mother   . Heart disease Father   . Hodgkin's lymphoma Father   . Breast cancer Sister   . Hyperlipidemia Sister    Social History   Social History  . Marital status: Divorced    Spouse name: N/A  . Number of children: N/A  . Years of education: N/A   Occupational History  . Retired Retired   Social History Main Topics  . Smoking status: Former Smoker    Packs/day: 1.00    Years: 30.00    Quit date: 10/01/2005  . Smokeless tobacco: Never Used  . Alcohol use 4.2 oz/week    4 Glasses of wine, 3 Standard drinks or equivalent per week     Comment: Wine 3-4 times a week   . Drug use: No  . Sexual activity: No   Other Topics Concern  . None   Social History Narrative   Married   Walks occasionally with  house work    Outpatient Encounter Prescriptions as of 12/18/2016  Medication Sig  . aspirin EC 81 MG tablet Take 81 mg by mouth daily.  . busPIRone (BUSPAR) 5 MG tablet Take 1 tablet (5 mg total) by mouth 2 (two) times daily.  . calcium carbonate (CALCIUM 600) 600 MG TABS Take 600 mg by mouth daily.    . citalopram (CELEXA) 20 MG tablet Take 1 tablet (20 mg total) by mouth daily.  . Multiple Vitamin (MULTIVITAMIN) tablet Take 1 tablet by mouth daily.    Marland Kitchen NAMENDA XR 28 MG CP24 24 hr capsule TAKE ONE (1) CAPSULE EACH DAY  . Omega-3 Fatty Acids (FISH OIL) 1200 MG CAPS Take 1,200 mg by mouth daily.  . sacubitril-valsartan (ENTRESTO) 24-26 MG Take 1 tablet by mouth 2 (two) times daily.  . traZODone (DESYREL) 50 MG tablet Take 1 tablet (50 mg total) by mouth at bedtime as needed for sleep.  . [DISCONTINUED] atorvastatin (LIPITOR) 40 MG tablet Take 1 tablet (40 mg total) by mouth daily at 6 PM.  . [DISCONTINUED] carvedilol (COREG) 3.125 MG tablet TAKE ONE TABLET TWICE DAILY WITH A MEAL  . [DISCONTINUED] clopidogrel (PLAVIX) 75 MG  tablet Take 1 tablet (75 mg total) by mouth daily.  . [DISCONTINUED] furosemide (LASIX) 20 MG tablet TAKE ONE (1) TABLET EACH DAY  . [DISCONTINUED] potassium chloride SA (K-DUR,KLOR-CON) 20 MEQ tablet TAKE ONE (1) TABLET EACH DAY  . acetaminophen (TYLENOL) 500 MG tablet Take 325 mg by mouth as needed for mild pain or fever.   . cetirizine (ZYRTEC) 10 MG tablet Take 10 mg by mouth daily as needed for allergies.   . nitroGLYCERIN (NITROSTAT) 0.4 MG SL tablet Place 1 tablet (0.4 mg total) under the tongue every 5 (five) minutes as needed. (Patient not taking: Reported on 12/18/2016)   No facility-administered encounter medications on file as of 12/18/2016.     Review of Systems  Constitutional: Negative for appetite change and fever.  HENT: Negative for congestion and sinus pressure.   Respiratory: Negative for cough, chest tightness and shortness of breath.   Cardiovascular: Negative for chest pain, palpitations and leg swelling.  Gastrointestinal: Negative for abdominal pain, diarrhea, nausea and vomiting.  Genitourinary: Negative for difficulty urinating and dysuria.  Musculoskeletal: Negative for back pain and joint swelling.  Skin: Negative for color change and rash.  Neurological: Negative for dizziness, light-headedness and headaches.  Psychiatric/Behavioral:       Increased agitation when discussing with her regarding my concerns about her staying home by herself,  Discussed with her the need to be in an assisted living facility.         Objective:    Physical Exam  Constitutional: She appears well-developed and well-nourished. No distress.  HENT:  Nose: Nose normal.  Mouth/Throat: Oropharynx is clear and moist.  Neck: Neck supple. No thyromegaly present.  Cardiovascular: Normal rate and regular rhythm.   Pulmonary/Chest: Breath sounds normal. No respiratory distress. She has no wheezes.  Abdominal: Soft. Bowel sounds are normal. There is no tenderness.  Musculoskeletal: She  exhibits no edema or tenderness.  Lymphadenopathy:    She has no cervical adenopathy.  Skin: No rash noted. No erythema.  Psychiatric: She has a normal mood and affect. Her behavior is normal.    BP 140/82 (BP Location: Right Arm, Patient Position: Sitting)   Pulse 72   Temp 98.5 F (36.9 C) (Oral)   Resp 16   Wt 134 lb (60.8 kg)   BMI 23.00  kg/m  Wt Readings from Last 3 Encounters:  12/18/16 134 lb (60.8 kg)  12/11/16 142 lb 9.6 oz (64.7 kg)  06/26/16 137 lb (62.1 kg)     Lab Results  Component Value Date   WBC 4.2 12/10/2016   HGB 15.2 12/10/2016   HCT 44.4 12/10/2016   PLT 225 12/10/2016   GLUCOSE 99 12/10/2016   CHOL 190 12/11/2016   TRIG 88 12/11/2016   HDL 58 12/11/2016   LDLCALC 114 (H) 12/11/2016   ALT 10 (L) 12/10/2016   AST 27 12/10/2016   NA 136 12/10/2016   K 4.2 12/10/2016   CL 105 12/10/2016   CREATININE 1.16 (H) 12/10/2016   BUN 11 12/10/2016   CO2 24 12/10/2016   TSH 2.24 02/08/2016   INR 0.98 12/10/2016   HGBA1C 5.1 12/11/2016    Ct Head Wo Contrast  Result Date: 12/10/2016 CLINICAL DATA:  Confusion and dysarthria EXAM: CT HEAD WITHOUT CONTRAST TECHNIQUE: Contiguous axial images were obtained from the base of the skull through the vertex without intravenous contrast. COMPARISON:  Brain MRI January 09, 2013 FINDINGS: Brain: There is mild diffuse atrophy, stable. There is no intracranial mass, hemorrhage, extra-axial fluid collection, or midline shift. There is mild patchy small vessel disease in the centra semiovale bilaterally. Elsewhere the gray-white compartments appear normal. No acute infarct evident. Vascular: No hyperdense vessel. There are foci of atherosclerotic calcification in the carotid siphon regions. Skull: Bony calvarium appears intact. Sinuses/Orbits: There is mucosal thickening in multiple ethmoid air cells. Other visualized paranasal sinuses are clear. Visualized orbits appear symmetric bilaterally. Other: Mastoid air cells are clear.  IMPRESSION: Stable mild atrophy with patchy periventricular small vessel disease. No intracranial mass, hemorrhage, or extra-axial fluid collection. No acute appearing infarct. Foci of arterial vascular calcification noted. There is mucosal thickening in multiple ethmoid air cells bilaterally. Electronically Signed   By: Lowella Grip III M.D.   On: 12/10/2016 11:33   Mr Jodene Nam Head Wo Contrast  Result Date: 12/11/2016 CLINICAL DATA:  New onset dysarthria and right upper extremity weakness beginning yesterday. EXAM: MRI HEAD WITHOUT CONTRAST MRA HEAD WITHOUT CONTRAST TECHNIQUE: Multiplanar, multiecho pulse sequences of the brain and surrounding structures were obtained without intravenous contrast. Angiographic images of the head were obtained using MRA technique without contrast. COMPARISON:  CT head without contrast 12/10/2016. MRI brain 01/09/2013. FINDINGS: MRI HEAD FINDINGS Brain: Scattered punctate left MCA territory acute infarcts are present. There scattered subcortical white matter infarcts. There is also infarction involving the posterior left insular cortex and sylvian fissure. Two additional punctate foci of acute cortical infarction are present in the left parietal lobe Vascular: The right vertebral artery flow void is absent. There is T1 shortening. This suggests slow or occluded flow. This may represent acute occlusion given the T1 shortening. Flow is present within the anterior circulation. Skull and upper cervical spine: The skullbase is normal. Midline sagittal structures are unremarkable. Sinuses/Orbits: Mild mucosal thickening is present in the ethmoid air cells bilaterally. The remaining paranasal sinuses are clear. There is minimal fluid in the inferior left mastoid air cells. No obstructing nasopharyngeal lesion is present. A right lens replacement is noted. The globes and orbits are otherwise intact. MRA HEAD FINDINGS The internal carotid arteries are within normal limits from the high  cervical segments through the ICA termini bilaterally. Left A1 segment is dominant. Anterior communicating artery is patent. ACA branch vessels are within normal limits. The M1 segments are normal. There is a high-grade stenosis or occlusion of the  superior right M2 division. Marked signal loss is present in the superior and inferior left M2 divisions proximally. More distal superior branch vessels are present. There is some artifact from patient motion at the level of the MCA bifurcations. Left vertebral artery is within normal limits. The left PICA origin is normal. The right vertebral artery is occluded. Minimal signal is present distally. A right AICA vessel is noted. The basilar artery is normal. Both posterior cerebral arteries originate from the basilar tip. Asymmetric attenuation of branch vessels is noted on the right. IMPRESSION: 1. Scattered foci of acute nonhemorrhagic infarction within the left MCA territory. 2. Acute infarcts involve the posterior left insular cortex and sylvian fissure. 3. Proximal M2 stenoses or occlusions just be on the bifurcations, left greater than right. 4. Right vertebral artery occlusion. This may be acute. No acute right cerebellar infarct is present. These results will be called to the ordering clinician or representative by the Radiologist Assistant, and communication documented in the PACS or zVision Dashboard. Electronically Signed   By: San Morelle M.D.   On: 12/11/2016 10:20   Mr Brain Wo Contrast  Result Date: 12/11/2016 CLINICAL DATA:  New onset dysarthria and right upper extremity weakness beginning yesterday. EXAM: MRI HEAD WITHOUT CONTRAST MRA HEAD WITHOUT CONTRAST TECHNIQUE: Multiplanar, multiecho pulse sequences of the brain and surrounding structures were obtained without intravenous contrast. Angiographic images of the head were obtained using MRA technique without contrast. COMPARISON:  CT head without contrast 12/10/2016. MRI brain 01/09/2013.  FINDINGS: MRI HEAD FINDINGS Brain: Scattered punctate left MCA territory acute infarcts are present. There scattered subcortical white matter infarcts. There is also infarction involving the posterior left insular cortex and sylvian fissure. Two additional punctate foci of acute cortical infarction are present in the left parietal lobe Vascular: The right vertebral artery flow void is absent. There is T1 shortening. This suggests slow or occluded flow. This may represent acute occlusion given the T1 shortening. Flow is present within the anterior circulation. Skull and upper cervical spine: The skullbase is normal. Midline sagittal structures are unremarkable. Sinuses/Orbits: Mild mucosal thickening is present in the ethmoid air cells bilaterally. The remaining paranasal sinuses are clear. There is minimal fluid in the inferior left mastoid air cells. No obstructing nasopharyngeal lesion is present. A right lens replacement is noted. The globes and orbits are otherwise intact. MRA HEAD FINDINGS The internal carotid arteries are within normal limits from the high cervical segments through the ICA termini bilaterally. Left A1 segment is dominant. Anterior communicating artery is patent. ACA branch vessels are within normal limits. The M1 segments are normal. There is a high-grade stenosis or occlusion of the superior right M2 division. Marked signal loss is present in the superior and inferior left M2 divisions proximally. More distal superior branch vessels are present. There is some artifact from patient motion at the level of the MCA bifurcations. Left vertebral artery is within normal limits. The left PICA origin is normal. The right vertebral artery is occluded. Minimal signal is present distally. A right AICA vessel is noted. The basilar artery is normal. Both posterior cerebral arteries originate from the basilar tip. Asymmetric attenuation of branch vessels is noted on the right. IMPRESSION: 1. Scattered foci  of acute nonhemorrhagic infarction within the left MCA territory. 2. Acute infarcts involve the posterior left insular cortex and sylvian fissure. 3. Proximal M2 stenoses or occlusions just be on the bifurcations, left greater than right. 4. Right vertebral artery occlusion. This may be acute. No acute  right cerebellar infarct is present. These results will be called to the ordering clinician or representative by the Radiologist Assistant, and communication documented in the PACS or zVision Dashboard. Electronically Signed   By: San Morelle M.D.   On: 12/11/2016 10:20   US Carotid Bilateral  Result Date: 12/10/2016 CLINICAL DATA:  CVA. EXAM: BILATERAL CAROTID DUPLEX ULTRASOUND TECHNIQUE: Pearline Cables scale imaging, color Doppler and duplex ultrasound were performed of bilateral carotid and vertebral arteries in the neck. COMPARISON:  CT 12/10/2016. FINDINGS: Criteria: Quantification of carotid stenosis is based on velocity parameters that correlate the residual internal carotid diameter with NASCET-based stenosis levels, using the diameter of the distal internal carotid lumen as the denominator for stenosis measurement. The following velocity measurements were obtained: RIGHT ICA:  214/22 cm/sec CCA:  26/33 cm/sec SYSTOLIC ICA/CCA RATIO:  2.2 DIASTOLIC ICA/CCA RATIO:  2.0 ECA:  249 cm/sec LEFT ICA:  126/23 cm/sec CCA:  354/56 cm/sec SYSTOLIC ICA/CCA RATIO:  1.1 DIASTOLIC ICA/CCA RATIO:  1.2 ECA:  179 cm/sec RIGHT CAROTID ARTERY: Right common carotid artery and right carotid bifurcation moderate atherosclerotic vascular disease. Degree of stenosis 50-69%. RIGHT VERTEBRAL ARTERY:  Patent with antegrade flow. LEFT CAROTID ARTERY: Mild to moderate left common carotid, carotid bifurcation atherosclerotic vascular disease. Degree of stenosis less than 50%. LEFT VERTEBRAL ARTERY:  Patent with antegrade flow. IMPRESSION: 1. Right common carotid and right carotid bifurcation moderate atherosclerotic vascular disease.  Degree of stenosis 50-69%. 2. Mild moderate left common carotid, carotid bifurcation atherosclerotic vascular disease. Degree of stenosis less than 50%. Electronically Signed   By: Marcello Moores  Register   On: 12/10/2016 15:05       Assessment & Plan:   Problem List Items Addressed This Visit    AAA (abdominal aortic aneurysm) ([place])    Followed by vascular surgery.        Acute CVA (cerebrovascular accident) [facility])    Recent admission with CVA as outlined.  Memory and judgement worse since CVA.  Unable to stay by herself.  Family staying with her.  Discussed with her family.  Discussed with the patient.  Needs assisted living.  Discussed with Dr Gretel Acre.        Anemia, iron deficiency    Follow cbc.       Anxiety    Followed by Dr Gretel Acre.  Needs f/u.        Carotid stenosis    Followed by [facility].  Due f/u.        CKD (chronic kidney disease), stage III    Follow metabolic panel.        Coronary atherosclerosis    Has seen Dr Rockey Situ.  Also has been to heart failure clinic.  Stable.  Continue risk factor modification.        Depression    Followed by psychiatry.  D/w Dr Gretel Acre regarding her current mental state.        HTN (hypertension) ([place])    Blood pressure under reasonable control.  Follow pressures.  Follow metabolic panel.        Hypercholesterolemia    On simvastatin.  Low cholesterol diet and exercise.  Follow lipid panel and liver function tests.        Hyperparathyroidism ([place])    Has declined further evaluation.  Follow calcium level.        Memory change    Had mri.  Has seen Dr Manuella Ghazi previously.  On namenda.  Worse since recent admission with CVA.  Do not feel safe for her to  stay by herself.  Family staying with her now.  D/w Dr Gretel Acre.  Will have her f/u with Dr Gretel Acre to help determine her mental competency.  Discussed with pt today.  Discussed the need for assisted living.          I spent more than 45 minutes with the patient and more than 50% of the  time was spent in consultation regarding the above.  Time spent discussing with the patient her recent admission and concerns regarding her staying by herself.  Also spent time with family members present with her discussing her recent hospitalization and concerns regarding her staying by herself.  Discussed plan of treatment and care.     [name], MD

## 2016-12-18 NOTE — Telephone Encounter (Signed)
Printed off and put with notes when patient was in the office today was their anything further I need to do with this?

## 2016-12-18 NOTE — Progress Notes (Signed)
Pre visit review using our clinic review tool, if applicable. No additional management support is needed unless otherwise documented below in the visit note. 

## 2016-12-19 NOTE — Telephone Encounter (Signed)
Spoke with Lindsay regarding home health visit.  Met with pt, pts son (Dave) and pts sister Marty in the office.  Spoke to pts son (Dave) and two daughter's (Lisa and Michelle) - conference call 12/18/16 pm.   

## 2016-12-20 ENCOUNTER — Telehealth: Payer: Self-pay

## 2016-12-20 ENCOUNTER — Ambulatory Visit: Payer: Self-pay | Admitting: Family

## 2016-12-20 MED ORDER — POTASSIUM CHLORIDE CRYS ER 20 MEQ PO TBCR: EXTENDED_RELEASE_TABLET | ORAL | 2 refills | Status: DC

## 2016-12-20 MED ORDER — ATORVASTATIN CALCIUM 40 MG PO TABS: 40.0000 mg | ORAL_TABLET | Freq: Every day | ORAL | 2 refills | Status: DC

## 2016-12-20 MED ORDER — CARVEDILOL 3.125 MG PO TABS: ORAL_TABLET | ORAL | 2 refills | Status: DC

## 2016-12-20 MED ORDER — CLOPIDOGREL BISULFATE 75 MG PO TABS: 75.0000 mg | ORAL_TABLET | Freq: Every day | ORAL | 2 refills | Status: DC

## 2016-12-20 MED ORDER — FUROSEMIDE 20 MG PO TABS: ORAL_TABLET | ORAL | 2 refills | Status: DC

## 2016-12-20 NOTE — Telephone Encounter (Signed)
Jody called wanted to know if we could refill all the medications from her blister back that you prescribe. She will be out of a some of them today. He does the pack a week at a time for her.

## 2016-12-20 NOTE — Telephone Encounter (Signed)
Spoke to Pharmacy gave verbal states that this was not received yet.

## 2016-12-20 NOTE — Telephone Encounter (Signed)
ok'd refills for lasix, potassium, coreg, lipitor and plavix.  Dr Gretel Acre prescribes the psych medications and it appears that [name] has been prescribing her other heart medications.  Let me know if problems.

## 2016-12-21 ENCOUNTER — Ambulatory Visit: Payer: Self-pay | Admitting: Internal Medicine

## 2016-12-23 ENCOUNTER — Encounter: Payer: Self-pay | Admitting: Internal Medicine

## 2016-12-23 NOTE — Assessment & Plan Note (Signed)
Has seen Dr Rockey Situ.  Also has been to heart failure clinic.  Stable.  Continue risk factor modification.

## 2016-12-23 NOTE — Assessment & Plan Note (Signed)
Blood pressure under reasonable control.  Follow pressures.  Follow metabolic panel.  

## 2016-12-23 NOTE — Assessment & Plan Note (Signed)
Recent admission with CVA as outlined.  Memory and judgement worse since CVA.  Unable to stay by herself.  Family staying with her.  Discussed with her family.  Discussed with the patient.  Needs assisted living.  Discussed with Dr Gretel Acre.

## 2016-12-23 NOTE — Assessment & Plan Note (Signed)
Followed by psychiatry.  D/w Dr Gretel Acre regarding her current mental state.

## 2016-12-23 NOTE — Assessment & Plan Note (Signed)
Followed by Dr Gretel Acre.  Needs f/u.

## 2016-12-23 NOTE — Assessment & Plan Note (Signed)
Follow cbc.  

## 2016-12-23 NOTE — Assessment & Plan Note (Signed)
Had mri.  Has seen Dr Manuella Ghazi previously.  On namenda.  Worse since recent admission with CVA.  Do not feel safe for her to stay by herself.  Family staying with her now.  D/w Dr Gretel Acre.  Will have her f/u with Dr Gretel Acre to help determine her mental competency.  Discussed with pt today.  Discussed the need for assisted living.

## 2016-12-23 NOTE — Assessment & Plan Note (Signed)
On simvastatin.  Low cholesterol diet and exercise.  Follow lipid panel and liver function tests.   

## 2016-12-23 NOTE — Assessment & Plan Note (Signed)
Has declined further evaluation.  Follow calcium level.   

## 2016-12-23 NOTE — Assessment & Plan Note (Signed)
Followed by [facility].  Due f/u.

## 2016-12-23 NOTE — Assessment & Plan Note (Signed)
Followed by vascular surgery. 

## 2016-12-23 NOTE — Assessment & Plan Note (Signed)
Follow metabolic panel.  

## 2017-01-03 ENCOUNTER — Telehealth: Payer: Self-pay | Admitting: Internal Medicine

## 2017-01-03 DIAGNOSIS — F0390 Unspecified dementia without behavioral disturbance: Secondary | ICD-10-CM

## 2017-01-03 DIAGNOSIS — I639 Cerebral infarction, unspecified: Secondary | ICD-10-CM

## 2017-01-03 NOTE — Telephone Encounter (Signed)
Pt daughter called to follow up on pt getting a referral for the neurologist. Daughter requested if a appt can be made on 04/16/,4/17 or 04/18 her daughter will be in town. Please advise?  Call daughter @ [phone]. Thank you!

## 2017-01-04 NOTE — Telephone Encounter (Signed)
Please advise 

## 2017-01-04 NOTE — Telephone Encounter (Signed)
See phone note.  I have placed the order for the referral.

## 2017-01-04 NOTE — Telephone Encounter (Signed)
Pt daughter Sharyn Lull called and asked if we can send records to Dr. Trena Platt office in regards to pt's recent stroke. Please advise, thank you!  Call [facility] @ [phone]

## 2017-01-14 ENCOUNTER — Telehealth: Payer: Self-pay | Admitting: Internal Medicine

## 2017-01-14 NOTE — Telephone Encounter (Signed)
Informed Jodie will call family and let them know.

## 2017-01-14 NOTE — Telephone Encounter (Signed)
She needs to call her psychiatrist and make an appt and get meds refilled through psychiatry.  I have talked with her psychiatrist about her issues.

## 2017-01-14 NOTE — Telephone Encounter (Signed)
XBMWU [phone] called from [employer] with some questions regarding pt medication about pt Psy medication. Psy dr will not refill her meds.has not been seen since 06/21/2016.  Please advise?

## 2017-01-14 NOTE — Telephone Encounter (Signed)
Please advise 

## 2017-01-16 ENCOUNTER — Other Ambulatory Visit: Payer: Self-pay | Admitting: Internal Medicine

## 2017-01-16 DIAGNOSIS — G309 Alzheimer's disease, unspecified: Secondary | ICD-10-CM | POA: Diagnosis not present

## 2017-01-16 DIAGNOSIS — F0280 Dementia in other diseases classified elsewhere without behavioral disturbance: Secondary | ICD-10-CM | POA: Diagnosis not present

## 2017-01-16 DIAGNOSIS — I69320 Aphasia following cerebral infarction: Secondary | ICD-10-CM | POA: Diagnosis not present

## 2017-01-16 DIAGNOSIS — F0150 Vascular dementia without behavioral disturbance: Secondary | ICD-10-CM | POA: Diagnosis not present

## 2017-01-17 NOTE — Telephone Encounter (Signed)
Looks like [place] gave this script last? Is it something you should be refilling?

## 2017-01-17 NOTE — Telephone Encounter (Signed)
She needs to be taking and needs to f/u with cardiology, so would prefer they refill if seeing.  Let me know if problems.

## 2017-02-05 ENCOUNTER — Telehealth: Payer: Self-pay | Admitting: Psychiatry

## 2017-02-06 ENCOUNTER — Other Ambulatory Visit: Payer: Self-pay | Admitting: Internal Medicine

## 2017-02-06 ENCOUNTER — Institutional Professional Consult (permissible substitution): Payer: PPO | Admitting: Psychiatry

## 2017-02-07 ENCOUNTER — Telehealth: Payer: Self-pay | Admitting: Family

## 2017-02-07 NOTE — Telephone Encounter (Signed)
This was prescribed by cardiology.  They should (and need to still be following).  Please contact their office and schedule appt and notify pts responsible party regarding appt.  Needs to be refilled by cardiology.

## 2017-02-07 NOTE — Telephone Encounter (Signed)
Spoke with patient regarding entresto refill.   Patient hasn't been seen in our office since September 2017 and has cancelled her last 5 appointments with [place] and currently doesn't have an appointment scheduled with [place].  When talking with patient, I offered to make an appointment and explained that for [place] to refill the medication, that she would need to be seen by [place], she said that she didn't want to make an appointment right now and she would call us back.

## 2017-02-07 NOTE — Telephone Encounter (Signed)
Last refilled on 9/26 by a NP, not in our office, please advise, thanks

## 2017-02-07 NOTE — Telephone Encounter (Signed)
Spoke with Patients responsible party, confirmed that she does have a follow up and didn't need assistance getting that arranged.  Sent message to cardiology to refill, thanks

## 2017-02-07 NOTE — Telephone Encounter (Signed)
Received message from PCP's office that patient's entresto needed to be filled.  She hasn't been seen by our office since September 2017 and has cancelled her last 5 appointments with [place].  Spoke with patient regarding this and explained that for [place] to continue prescribing the medication, that she would need to make an appointment. Offered to make an appointment while I had her on the phone and she declined saying that she would call us back.

## 2017-02-08 ENCOUNTER — Other Ambulatory Visit: Payer: Self-pay | Admitting: Internal Medicine

## 2017-02-08 NOTE — Telephone Encounter (Signed)
Pharmacy called requesting a refill on her sacubitril-valsartan (ENTRESTO) 24-26 MG. She will be going to [place] for 2 weeks and has no medication left. Please advise, thank you!

## 2017-02-11 NOTE — Telephone Encounter (Signed)
Ok to refill x 1.  Will need f/u appt scheduled.

## 2017-02-11 NOTE — Telephone Encounter (Signed)
[facility] cardiologist filled prior per [facility] pharmacist at [facility] called and I spoke with today.  Patient is leaving to go out of town tomorrow at 400 pm.   Please advise.

## 2017-02-12 ENCOUNTER — Other Ambulatory Visit: Payer: Self-pay

## 2017-02-12 MED ORDER — SACUBITRIL-VALSARTAN 24-26 MG PO TABS: 1.0000 | ORAL_TABLET | Freq: Two times a day (BID) | ORAL | 0 refills | Status: DC

## 2017-02-12 NOTE — Telephone Encounter (Signed)
meds have been sent l/m to call office for f/u

## 2017-02-13 ENCOUNTER — Encounter: Payer: Self-pay | Admitting: Family

## 2017-02-13 ENCOUNTER — Ambulatory Visit: Payer: PPO | Attending: Family | Admitting: Family

## 2017-02-13 VITALS — BP 138/80 | HR 62 | Resp 20 | Ht 64.0 in | Wt 130.2 lb

## 2017-02-13 DIAGNOSIS — F419 Anxiety disorder, unspecified: Secondary | ICD-10-CM | POA: Diagnosis not present

## 2017-02-13 DIAGNOSIS — F0390 Unspecified dementia without behavioral disturbance: Secondary | ICD-10-CM | POA: Insufficient documentation

## 2017-02-13 DIAGNOSIS — I130 Hypertensive heart and chronic kidney disease with heart failure and stage 1 through stage 4 chronic kidney disease, or unspecified chronic kidney disease: Secondary | ICD-10-CM | POA: Diagnosis not present

## 2017-02-13 DIAGNOSIS — N183 Chronic kidney disease, stage 3 (moderate): Secondary | ICD-10-CM | POA: Diagnosis not present

## 2017-02-13 DIAGNOSIS — I714 Abdominal aortic aneurysm, without rupture: Secondary | ICD-10-CM | POA: Insufficient documentation

## 2017-02-13 DIAGNOSIS — Z85528 Personal history of other malignant neoplasm of kidney: Secondary | ICD-10-CM | POA: Diagnosis not present

## 2017-02-13 DIAGNOSIS — I5022 Chronic systolic (congestive) heart failure: Secondary | ICD-10-CM | POA: Insufficient documentation

## 2017-02-13 DIAGNOSIS — I10 Essential (primary) hypertension: Secondary | ICD-10-CM

## 2017-02-13 DIAGNOSIS — R413 Other amnesia: Secondary | ICD-10-CM

## 2017-02-13 MED ORDER — SACUBITRIL-VALSARTAN 24-26 MG PO TABS: 1.0000 | ORAL_TABLET | Freq: Two times a day (BID) | ORAL | 5 refills | Status: DC

## 2017-02-13 MED ORDER — ATORVASTATIN CALCIUM 40 MG PO TABS: 40.0000 mg | ORAL_TABLET | Freq: Every day | ORAL | 5 refills | Status: DC

## 2017-02-13 MED ORDER — CARVEDILOL 3.125 MG PO TABS: ORAL_TABLET | ORAL | 5 refills | Status: DC

## 2017-02-13 MED ORDER — POTASSIUM CHLORIDE CRYS ER 20 MEQ PO TBCR: EXTENDED_RELEASE_TABLET | ORAL | 5 refills | Status: DC

## 2017-02-13 MED ORDER — CLOPIDOGREL BISULFATE 75 MG PO TABS: 75.0000 mg | ORAL_TABLET | Freq: Every day | ORAL | 5 refills | Status: DC

## 2017-02-13 MED ORDER — FUROSEMIDE 20 MG PO TABS: ORAL_TABLET | ORAL | 5 refills | Status: DC

## 2017-02-13 NOTE — Progress Notes (Signed)
Patient ID: Karen Collins, female    DOB: 09-29-1934, 83 y.o.   MRN: [id number]  HPI  Karen Collins is a 83 y/o female with a history of HTN, MI, AAA, CKD stage 3, anxiety, memory loss, renal cell cancer and chronic heart failure.  Last echo done 05/11/16 with an EF of 35-40%, mild MR and no aortic stenosis. Last catheterization was done December 2011.  Admitted 12/10/16 due to acute CVA. Neurology consult obtained. Treated and discharged home with home health, PT & OT. Previous hospital admission was 05/10/16 with chest pain/elevated troponin & acute systolic HF. Was diuresed and sent home on diuretics.   Returns today for a follow-up visit with a chief complaint of mild fatigue. She describes this as chronic in nature and is stable at this time. Has been present intermittently for years. Denies any associated symptoms with this.    Past Medical History:  Diagnosis Date  . AAA (abdominal aortic aneurysm) ([place])    s/p stent   . Anxiety   . Arthritis   . CAD (coronary artery disease)   . Chronic kidney disease (CKD), stage III (moderate)   . Chronic systolic congestive heart failure (HCC)    EF of 35 %   . Dementia   . Depression   . Diverticulitis    H/O  . GERD (gastroesophageal reflux disease)   . GI bleed   . Hx of migraines   . Hx: UTI (urinary tract infection)   . Hyperlipidemia   . Hypertension   . Kidney stones   . MI (myocardial infarction)    Non-ST elevation MI  . Renal cell carcinoma   . Thyroid disease     Past Surgical History:  Procedure Laterality Date  . ABDOMINAL AORTIC ANEURYSM REPAIR     s/p stent  . ABDOMINAL AORTIC ENDOVASCULAR STENT GRAFT    . CARDIAC CATHETERIZATION    . CARDIAC CATHETERIZATION  09/18/2010  . CHOLECYSTECTOMY    . CORONARY STENT PLACEMENT  09/18/2010   CAD; MI s/p stent  . LAPAROSCOPIC PARTIAL NEPHRECTOMY  2010  . NEPHRECTOMY  10/2008   Right  . PARATHYROIDECTOMY    . SHOULDER SURGERY  07/2010   Right  . THYROIDECTOMY       Family History  Problem Relation Age of Onset  . Arthritis Mother   . Heart disease Mother   . Pulmonary fibrosis Mother   . Heart disease Father   . Hodgkin's lymphoma Father   . Breast cancer Sister   . Hyperlipidemia Sister     Social History  Substance Use Topics  . Smoking status: Former Smoker    Packs/day: 1.00    Years: 30.00    Quit date: 10/01/2005  . Smokeless tobacco: Never Used  . Alcohol use 4.2 oz/week    4 Glasses of wine, 3 Standard drinks or equivalent per week     Comment: Wine 3-4 times a week     Allergies  Allergen Reactions  . Tramadol Rash   Prior to Admission medications   Medication Sig Start Date End Date Taking? Authorizing Provider  aspirin EC 81 MG tablet Take 81 mg by mouth daily.   Yes [provider]  atorvastatin (LIPITOR) 40 MG tablet Take 1 tablet (40 mg total) by mouth daily at 6 PM. 12/20/16  Yes [name], MD  busPIRone (BUSPAR) 5 MG tablet Take 1 tablet (5 mg total) by mouth 2 (two) times daily. 06/21/16  Yes Rainey Pines, MD  calcium  carbonate (CALCIUM 600) 600 MG TABS Take 600 mg by mouth daily.     Yes [provider]  carvedilol (COREG) 3.125 MG tablet TAKE ONE TABLET TWICE DAILY WITH A MEAL 12/20/16  Yes [name], MD  citalopram (CELEXA) 20 MG tablet Take 1 tablet (20 mg total) by mouth daily. 06/21/16  Yes Rainey Pines, MD  clopidogrel (PLAVIX) 75 MG tablet Take 1 tablet (75 mg total) by mouth daily. 12/20/16  Yes [name], MD  furosemide (LASIX) 20 MG tablet TAKE ONE (1) TABLET EACH DAY 12/20/16  Yes [name], MD  Multiple Vitamin (MULTIVITAMIN) tablet Take 1 tablet by mouth daily.     Yes [provider]  Omega-3 Fatty Acids (FISH OIL) 1200 MG CAPS Take 1,200 mg by mouth daily.   Yes [provider]  potassium chloride SA (K-DUR,KLOR-CON) 20 MEQ tablet TAKE ONE (1) TABLET EACH DAY 12/20/16  Yes [name], MD  sacubitril-valsartan (ENTRESTO) 24-26 MG Take 1 tablet  by mouth 2 (two) times daily. 02/12/17  Yes [name], MD  traZODone (DESYREL) 50 MG tablet Take 1 tablet (50 mg total) by mouth at bedtime as needed for sleep. 06/21/16  Yes Rainey Pines, MD  acetaminophen (TYLENOL) 500 MG tablet Take 325 mg by mouth as needed for mild pain or fever.     [provider]  cetirizine (ZYRTEC) 10 MG tablet Take 10 mg by mouth daily as needed for allergies.     [provider]  NAMENDA XR 28 MG CP24 24 hr capsule TAKE ONE (1) CAPSULE EACH DAY Patient not taking: Reported on 02/13/2017 10/09/16   Rainey Pines, MD  nitroGLYCERIN (NITROSTAT) 0.4 MG SL tablet Place 1 tablet (0.4 mg total) under the tongue every 5 (five) minutes as needed. Patient not taking: Reported on 12/18/2016 05/25/16   Alisa Graff, FNP   Review of Systems  Constitutional: Positive for fatigue. Negative for appetite change.  HENT: Negative for congestion, postnasal drip and sore throat.   Eyes: Negative.   Respiratory: Negative for cough, chest tightness and shortness of breath.   Cardiovascular: Negative for chest pain, palpitations and leg swelling.  Gastrointestinal: Negative for abdominal distention and abdominal pain.  Endocrine: Negative.   Genitourinary: Negative.   Musculoskeletal: Negative for back pain and neck pain.  Skin: Negative.   Allergic/Immunologic: Negative.   Neurological: Negative for dizziness and light-headedness.       Memory issues   Hematological: Negative for adenopathy. Does not bruise/bleed easily.  Psychiatric/Behavioral: Negative for dysphoric mood and sleep disturbance. The patient is not nervous/anxious.    Vitals:   02/13/17 0943  BP: 138/80  Pulse: 62  Resp: 20  SpO2: 96%  Weight: 130 lb 4 oz (59.1 kg)  Height: 5\' 4"  (1.626 m)   Wt Readings from Last 3 Encounters:  02/13/17 130 lb 4 oz (59.1 kg)  12/18/16 134 lb (60.8 kg)  12/11/16 142 lb 9.6 oz (64.7 kg)   Lab Results  Component Value Date   CREATININE 1.16 (H)  12/10/2016   CREATININE 0.93 05/12/2016   CREATININE 1.02 (H) 05/11/2016   Physical Exam  Constitutional: She is oriented to person, place, and time. She appears well-developed and well-nourished.  HENT:  Head: Normocephalic and atraumatic.  Neck: Normal range of motion. Neck supple.  Cardiovascular: Normal rate and regular rhythm.   Pulmonary/Chest: Effort normal. She has no wheezes. She has no rales.  Abdominal: Soft. She exhibits no distension. There is no tenderness.  Musculoskeletal: She exhibits  no edema or tenderness.  Neurological: She is alert and oriented to person, place, and time.  Skin: Skin is warm and dry.  Psychiatric: She has a normal mood and affect. Her behavior is normal.  Nursing note and vitals reviewed.  Assessment & Plan:  1: Chronic heart failure with reduced ejection fraction- - EF of 35-40% as of 05/11/16 ischemic in origin.  - NYHA Class II - Volume status stable, lungs clear, no edema - currently taking medications in a bubble pack and appears to be taking them correctly - hasn't seen cardiologist Rockey Situ) in quite some time. Patient has been very inconsistent with keeping appointments - adding some salt to her food and she was encouraged to not add any salt to anything if possible  2: HTN- - BP looks good today - saw PCP Nicki Reaper) 12/18/16  3: Memory change- - Follows closely with psychiatrist Gretel Acre) - is going to [place] to visit kids and will be travelling with her son. He would like her to move there but she doesn't want to - he has noticed that her memory is getting worse  - Using weekly blister packs delivered by her pharmacy  Return here in 3 months or sooner for any questions/problems before then.

## 2017-02-14 NOTE — Telephone Encounter (Signed)
App made with daughter

## 2017-05-16 ENCOUNTER — Encounter: Payer: Self-pay | Admitting: Family

## 2017-05-16 ENCOUNTER — Ambulatory Visit: Payer: PPO | Attending: Family | Admitting: Family

## 2017-05-16 ENCOUNTER — Ambulatory Visit: Payer: Self-pay | Admitting: Family

## 2017-05-16 VITALS — BP 119/74 | HR 81 | Resp 20 | Ht 64.0 in | Wt 129.2 lb

## 2017-05-16 DIAGNOSIS — N183 Chronic kidney disease, stage 3 (moderate): Secondary | ICD-10-CM | POA: Insufficient documentation

## 2017-05-16 DIAGNOSIS — F329 Major depressive disorder, single episode, unspecified: Secondary | ICD-10-CM | POA: Diagnosis not present

## 2017-05-16 DIAGNOSIS — Z85528 Personal history of other malignant neoplasm of kidney: Secondary | ICD-10-CM | POA: Diagnosis not present

## 2017-05-16 DIAGNOSIS — R413 Other amnesia: Secondary | ICD-10-CM

## 2017-05-16 DIAGNOSIS — Z7982 Long term (current) use of aspirin: Secondary | ICD-10-CM | POA: Insufficient documentation

## 2017-05-16 DIAGNOSIS — E785 Hyperlipidemia, unspecified: Secondary | ICD-10-CM | POA: Diagnosis not present

## 2017-05-16 DIAGNOSIS — E079 Disorder of thyroid, unspecified: Secondary | ICD-10-CM | POA: Insufficient documentation

## 2017-05-16 DIAGNOSIS — Z905 Acquired absence of kidney: Secondary | ICD-10-CM | POA: Insufficient documentation

## 2017-05-16 DIAGNOSIS — Z87442 Personal history of urinary calculi: Secondary | ICD-10-CM | POA: Insufficient documentation

## 2017-05-16 DIAGNOSIS — K219 Gastro-esophageal reflux disease without esophagitis: Secondary | ICD-10-CM | POA: Diagnosis not present

## 2017-05-16 DIAGNOSIS — F0390 Unspecified dementia without behavioral disturbance: Secondary | ICD-10-CM | POA: Insufficient documentation

## 2017-05-16 DIAGNOSIS — I2510 Atherosclerotic heart disease of native coronary artery without angina pectoris: Secondary | ICD-10-CM | POA: Diagnosis not present

## 2017-05-16 DIAGNOSIS — I130 Hypertensive heart and chronic kidney disease with heart failure and stage 1 through stage 4 chronic kidney disease, or unspecified chronic kidney disease: Secondary | ICD-10-CM | POA: Diagnosis not present

## 2017-05-16 DIAGNOSIS — Z8673 Personal history of transient ischemic attack (TIA), and cerebral infarction without residual deficits: Secondary | ICD-10-CM | POA: Diagnosis not present

## 2017-05-16 DIAGNOSIS — I10 Essential (primary) hypertension: Secondary | ICD-10-CM

## 2017-05-16 DIAGNOSIS — Z87891 Personal history of nicotine dependence: Secondary | ICD-10-CM | POA: Insufficient documentation

## 2017-05-16 DIAGNOSIS — I5022 Chronic systolic (congestive) heart failure: Secondary | ICD-10-CM | POA: Insufficient documentation

## 2017-05-16 DIAGNOSIS — F419 Anxiety disorder, unspecified: Secondary | ICD-10-CM | POA: Diagnosis not present

## 2017-05-16 DIAGNOSIS — I714 Abdominal aortic aneurysm, without rupture: Secondary | ICD-10-CM | POA: Insufficient documentation

## 2017-05-16 DIAGNOSIS — I252 Old myocardial infarction: Secondary | ICD-10-CM | POA: Diagnosis not present

## 2017-05-16 NOTE — Progress Notes (Signed)
Patient ID: Karen Collins, female    DOB: [birth year], 83 y.o.   MRN: [id number]  HPI  Karen Collins is a 83 y/o female with a history of HTN, MI, AAA, CKD stage 3, anxiety, memory loss, renal cell cancer and chronic heart failure.  Last echo done 05/11/16 with an EF of 35-40%, mild MR and no aortic stenosis. Last catheterization was done December 2011.  Admitted 12/10/16 due to acute CVA. Neurology consult obtained. Treated and discharged home with home health, PT & OT. Previous hospital admission was 05/10/16 with chest pain/elevated troponin & acute systolic HF. Was diuresed and sent home on diuretics.   Returns today for a follow-up visit without a chief complaint. She denies any fatigue, shortness of breath, chest pain, dizziness, weight gain, pedal edema or difficulty sleeping.   Past Medical History:  Diagnosis Date  . AAA (abdominal aortic aneurysm) ([place])    s/p stent   . Anxiety   . Arthritis   . CAD (coronary artery disease)   . Chronic kidney disease (CKD), stage III (moderate)   . Chronic systolic congestive heart failure (HCC)    EF of 35 %   . Dementia   . Depression   . Diverticulitis    H/O  . GERD (gastroesophageal reflux disease)   . GI bleed   . Hx of migraines   . Hx: UTI (urinary tract infection)   . Hyperlipidemia   . Hypertension   . Kidney stones   . MI (myocardial infarction) ([place])    Non-ST elevation MI  . Renal cell carcinoma   . Thyroid disease     Past Surgical History:  Procedure Laterality Date  . ABDOMINAL AORTIC ANEURYSM REPAIR     s/p stent  . ABDOMINAL AORTIC ENDOVASCULAR STENT GRAFT    . CARDIAC CATHETERIZATION    . CARDIAC CATHETERIZATION  09/18/2010  . CHOLECYSTECTOMY    . CORONARY STENT PLACEMENT  09/18/2010   CAD; MI s/p stent  . LAPAROSCOPIC PARTIAL NEPHRECTOMY  2010  . NEPHRECTOMY  10/2008   Right  . PARATHYROIDECTOMY    . SHOULDER SURGERY  07/2010   Right  . THYROIDECTOMY      Family History  Problem Relation Age  of Onset  . Arthritis Mother   . Heart disease Mother   . Pulmonary fibrosis Mother   . Heart disease Father   . Hodgkin's lymphoma Father   . Breast cancer Sister   . Hyperlipidemia Sister     Social History  Substance Use Topics  . Smoking status: Former Smoker    Packs/day: 1.00    Years: 30.00    Quit date: 10/01/2005  . Smokeless tobacco: Never Used  . Alcohol use 4.2 oz/week    4 Glasses of wine, 3 Standard drinks or equivalent per week     Comment: Wine 3-4 times a week     Allergies  Allergen Reactions  . Tramadol Rash   Prior to Admission medications   Medication Sig Start Date End Date Taking? Authorizing Provider  aspirin EC 81 MG tablet Take 81 mg by mouth daily.   Yes [provider]  atorvastatin (LIPITOR) 40 MG tablet Take 1 tablet (40 mg total) by mouth daily at 6 PM. 02/13/17  Yes Hackney, Otila Kluver A, FNP  busPIRone (BUSPAR) 5 MG tablet Take 1 tablet (5 mg total) by mouth 2 (two) times daily. 06/21/16  Yes Rainey Pines, MD  calcium carbonate (CALCIUM 600) 600 MG TABS Take 600 mg by  mouth daily.     Yes [provider]  carvedilol (COREG) 3.125 MG tablet TAKE ONE TABLET TWICE DAILY WITH A MEAL 02/13/17  Yes [name] A, FNP  cetirizine (ZYRTEC) 10 MG tablet Take 10 mg by mouth daily as needed for allergies.    Yes [provider]  citalopram (CELEXA) 20 MG tablet Take 1 tablet (20 mg total) by mouth daily. 06/21/16  Yes Rainey Pines, MD  clopidogrel (PLAVIX) 75 MG tablet Take 1 tablet (75 mg total) by mouth daily. 02/13/17  Yes Alisa Graff, FNP  furosemide (LASIX) 20 MG tablet TAKE ONE (1) TABLET EACH DAY 02/13/17  Yes [name] A, FNP  Multiple Vitamin (MULTIVITAMIN) tablet Take 1 tablet by mouth daily.     Yes [provider]  nitroGLYCERIN (NITROSTAT) 0.4 MG SL tablet Place 1 tablet (0.4 mg total) under the tongue every 5 (five) minutes as needed. 05/25/16  Yes Hackney, Aura Fey, FNP  Omega-3 Fatty Acids (FISH OIL) 1200 MG  CAPS Take 1,200 mg by mouth daily.   Yes [provider]  potassium chloride SA (K-DUR,KLOR-CON) 20 MEQ tablet TAKE ONE (1) TABLET EACH DAY 02/13/17  Yes Hackney, Tina A, FNP  sacubitril-valsartan (ENTRESTO) 24-26 MG Take 1 tablet by mouth 2 (two) times daily. 02/13/17  Yes Alisa Graff, FNP  traZODone (DESYREL) 50 MG tablet Take 1 tablet (50 mg total) by mouth at bedtime as needed for sleep. 06/21/16  Yes Rainey Pines, MD    Review of Systems  Constitutional: Negative for appetite change and fatigue.  HENT: Negative for congestion, postnasal drip and sore throat.   Eyes: Negative.   Respiratory: Negative for cough, chest tightness and shortness of breath.   Cardiovascular: Negative for chest pain, palpitations and leg swelling.  Gastrointestinal: Negative for abdominal distention and abdominal pain.  Endocrine: Negative.   Genitourinary: Negative.   Musculoskeletal: Negative for back pain and neck pain.  Skin: Negative.   Allergic/Immunologic: Negative.   Neurological: Negative for dizziness and light-headedness.       Memory issues   Hematological: Negative for adenopathy. Does not bruise/bleed easily.  Psychiatric/Behavioral: Negative for dysphoric mood and sleep disturbance (sleeping on 1 pillow). The patient is not nervous/anxious.    Vitals:   05/16/17 1311  BP: 119/74  Pulse: 81  Resp: 20  SpO2: 97%  Weight: 129 lb 4 oz (58.6 kg)  Height: 5\' 4"  (1.626 m)   Wt Readings from Last 3 Encounters:  05/16/17 129 lb 4 oz (58.6 kg)  02/13/17 130 lb 4 oz (59.1 kg)  12/18/16 134 lb (60.8 kg)    Lab Results  Component Value Date   CREATININE 1.16 (H) 12/10/2016   CREATININE 0.93 05/12/2016   CREATININE 1.02 (H) 05/11/2016   Physical Exam  Constitutional: She appears well-developed and well-nourished.  HENT:  Head: Normocephalic and atraumatic.  Neck: Normal range of motion. Neck supple.  Cardiovascular: Normal rate and regular rhythm.   Pulmonary/Chest: Effort  normal. She has no wheezes. She has no rales.  Abdominal: Soft. She exhibits no distension. There is no tenderness.  Musculoskeletal: She exhibits no edema or tenderness.  Neurological: She is alert.  Skin: Skin is warm and dry.  Psychiatric: She has a normal mood and affect. Her behavior is normal.  Nursing note and vitals reviewed.  Assessment & Plan:  1: Chronic heart failure with reduced ejection fraction- - EF of 35-40% as of 05/11/16 ischemic in origin.  - NYHA Class I - euvolemic today -  has an aide that is now assisting on a weekly basis with the care of the patient  - hasn't seen cardiologist Rockey Situ) in quite some time. Patient has been very inconsistent with keeping appointments - adding some salt to her food and she was encouraged to not add any salt to anything if possible  2: HTN- - BP looks good today - saw PCP Nicki Reaper) 12/18/16  3: Memory change- - Follows closely with psychiatrist Gretel Acre) - saw neurologist Manuella Ghazi) 01/16/17 - no longer driving but does say she walks down the street in her neighborhood; denies ever getting lost  Patient did not bring her medications nor a list. Each medication was verbally reviewed with the patient and she was encouraged to bring the bottles to every visit to confirm accuracy of list.  Return here in 3 months or sooner for any questions/problems before then.

## 2017-05-16 NOTE — Patient Instructions (Signed)
Continue weighing daily and call for an overnight weight gain of > 2 pounds or a weekly weight gain of >5 pounds. 

## 2017-05-21 ENCOUNTER — Ambulatory Visit: Payer: Self-pay | Admitting: Internal Medicine

## 2017-05-23 ENCOUNTER — Ambulatory Visit: Payer: Self-pay | Admitting: Internal Medicine

## 2017-05-28 ENCOUNTER — Other Ambulatory Visit (INDEPENDENT_AMBULATORY_CARE_PROVIDER_SITE_OTHER): Payer: PPO

## 2017-05-28 ENCOUNTER — Encounter (INDEPENDENT_AMBULATORY_CARE_PROVIDER_SITE_OTHER): Payer: PPO

## 2017-05-28 ENCOUNTER — Ambulatory Visit (INDEPENDENT_AMBULATORY_CARE_PROVIDER_SITE_OTHER): Payer: Self-pay | Admitting: Vascular Surgery

## 2017-06-06 ENCOUNTER — Other Ambulatory Visit: Payer: Self-pay | Admitting: Internal Medicine

## 2017-06-19 ENCOUNTER — Ambulatory Visit: Payer: Self-pay | Admitting: Internal Medicine

## 2017-07-18 ENCOUNTER — Telehealth: Payer: Self-pay

## 2017-07-18 ENCOUNTER — Ambulatory Visit (INDEPENDENT_AMBULATORY_CARE_PROVIDER_SITE_OTHER): Payer: PPO | Admitting: Internal Medicine

## 2017-07-18 ENCOUNTER — Encounter: Payer: Self-pay | Admitting: Internal Medicine

## 2017-07-18 VITALS — BP 120/80 | HR 84 | Temp 97.6°F | Resp 20 | Ht 64.17 in | Wt 130.0 lb

## 2017-07-18 DIAGNOSIS — E213 Hyperparathyroidism, unspecified: Secondary | ICD-10-CM

## 2017-07-18 DIAGNOSIS — Z23 Encounter for immunization: Secondary | ICD-10-CM | POA: Diagnosis not present

## 2017-07-18 DIAGNOSIS — F419 Anxiety disorder, unspecified: Secondary | ICD-10-CM | POA: Diagnosis not present

## 2017-07-18 DIAGNOSIS — I714 Abdominal aortic aneurysm, without rupture, unspecified: Secondary | ICD-10-CM

## 2017-07-18 DIAGNOSIS — Z1231 Encounter for screening mammogram for malignant neoplasm of breast: Secondary | ICD-10-CM

## 2017-07-18 DIAGNOSIS — D509 Iron deficiency anemia, unspecified: Secondary | ICD-10-CM

## 2017-07-18 DIAGNOSIS — Z9189 Other specified personal risk factors, not elsewhere classified: Secondary | ICD-10-CM

## 2017-07-18 DIAGNOSIS — F32A Depression, unspecified: Secondary | ICD-10-CM

## 2017-07-18 DIAGNOSIS — F329 Major depressive disorder, single episode, unspecified: Secondary | ICD-10-CM

## 2017-07-18 DIAGNOSIS — N183 Chronic kidney disease, stage 3 unspecified: Secondary | ICD-10-CM

## 2017-07-18 DIAGNOSIS — I6529 Occlusion and stenosis of unspecified carotid artery: Secondary | ICD-10-CM

## 2017-07-18 DIAGNOSIS — I10 Essential (primary) hypertension: Secondary | ICD-10-CM

## 2017-07-18 DIAGNOSIS — E7800 Pure hypercholesterolemia, unspecified: Secondary | ICD-10-CM

## 2017-07-18 DIAGNOSIS — R413 Other amnesia: Secondary | ICD-10-CM | POA: Diagnosis not present

## 2017-07-18 DIAGNOSIS — Z1239 Encounter for other screening for malignant neoplasm of breast: Secondary | ICD-10-CM

## 2017-07-18 LAB — LIPID PANEL
Cholesterol: 220 mg/dL — ABNORMAL HIGH (ref 0–200)
HDL: 72.8 mg/dL (ref >39.00)
LDL CALC: 130 mg/dL — AB (ref 0–99)
NONHDL: 147.2
Total CHOL/HDL Ratio: 3
Triglycerides: 87 mg/dL (ref 0.0–149.0)
VLDL: 17.4 mg/dL (ref 0.0–40.0)

## 2017-07-18 LAB — BASIC METABOLIC PANEL
BUN: 10 mg/dL (ref 6–23)
CO2: 28 meq/L (ref 19–32)
Calcium: 10.8 mg/dL — ABNORMAL HIGH (ref 8.4–10.5)
Chloride: 103 mEq/L (ref 96–112)
Creatinine, Ser: 1.15 mg/dL (ref 0.40–1.20)
GFR: 48.11 mL/min — ABNORMAL LOW (ref >60.00)
Glucose, Bld: 109 mg/dL — ABNORMAL HIGH (ref 70–99)
Potassium: 5 mEq/L (ref 3.5–5.1)
SODIUM: 137 meq/L (ref 135–145)

## 2017-07-18 LAB — HEPATIC FUNCTION PANEL
ALT: 10 U/L (ref 0–35)
AST: 14 U/L (ref 0–37)
Albumin: 4 g/dL (ref 3.5–5.2)
Alkaline Phosphatase: 75 U/L (ref 39–117)
BILIRUBIN TOTAL: 0.8 mg/dL (ref 0.2–1.2)
Bilirubin, Direct: 0.1 mg/dL (ref 0.0–0.3)
Total Protein: 6.6 g/dL (ref 6.0–8.3)

## 2017-07-18 LAB — TSH: TSH: 2.71 u[IU]/mL (ref 0.35–4.50)

## 2017-07-18 MED ORDER — PNEUMOCOCCAL 13-VAL CONJ VACC IM SUSP: 0.5000 mL | INTRAMUSCULAR | Status: DC

## 2017-07-18 NOTE — Progress Notes (Signed)
Patient ID: ICESIS RENN, female   DOB: 10-30-1933, 83 y.o.   MRN: [id number]   Subjective:    Patient ID: [name], female    DOB: 1934/01/01, 83 y.o.   MRN: [id number]  HPI  Patient here for a scheduled follow up.  She is accompanied by her caretaker Dickey Gave [phone]).  History obtained from both of them.  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  MRI obtained which revealed the above infarct, right vertebral artery occlusion an generalized atrophy.  The recommended continuing aspirin and plavix.  She has dementia.  Was on namenda.  Instructed to taper off.  Discussed today for her not to drive.  Also recommended assisted living facility.  She has declined and continues to decline.  She is also followed by psychiatry for generalized anxiety disorder.  Discussed with her today.  She feels better.  Eating.  Walking.  No chest pain.  No sob.  No acid reflux.  No abdominal pain. Bowels moving.  She brought in her medications.  She brought in a prepared blister pack, but states this is not the one she is using for this week.  The caretaker reports she is not going over as often as she was.  Not sure if she is taking her medication regularly.  Discussed with her.  Discussed my concern that she is not taking her medication regularly.     Past Medical History:  Diagnosis Date  . AAA (abdominal aortic aneurysm) ([place])    s/p stent   . Anxiety   . Arthritis   . CAD (coronary artery disease)   . Chronic kidney disease (CKD), stage III (moderate) ([facility])   . Chronic systolic congestive heart failure ([facility])    EF of 35 %   . Dementia   . Depression   . Diverticulitis    H/O  . GERD (gastroesophageal reflux disease)   . GI bleed   . Hx of migraines   . Hx: UTI (urinary tract infection)   . Hyperlipidemia   . Hypertension   . Kidney stones   . MI (myocardial infarction)  ([place])    Non-ST elevation MI  . Renal cell carcinoma   . Thyroid disease    Past Surgical History:  Procedure Laterality Date  . ABDOMINAL AORTIC ANEURYSM REPAIR     s/p stent  . ABDOMINAL AORTIC ENDOVASCULAR STENT GRAFT    . CARDIAC CATHETERIZATION    . CARDIAC CATHETERIZATION  09/18/2010  . CHOLECYSTECTOMY    . CORONARY STENT PLACEMENT  09/18/2010   CAD; MI s/p stent  . LAPAROSCOPIC PARTIAL NEPHRECTOMY  2010  . NEPHRECTOMY  10/2008   Right  . PARATHYROIDECTOMY    . SHOULDER SURGERY  07/2010   Right  . THYROIDECTOMY     Family History  Problem Relation Age of Onset  . Arthritis Mother   . Heart disease Mother   . Pulmonary fibrosis Mother   . Heart disease Father   . Hodgkin's lymphoma Father   . Breast cancer Sister   . Hyperlipidemia Sister    Social History   Social History  . Marital status: Divorced    Spouse name: N/A  . Number of children: N/A  . Years of education: N/A   Occupational History  . Retired Retired   Social History Main Topics  . Smoking status: Former Smoker    Packs/day: 1.00  Years: 30.00    Quit date: 10/01/2005  . Smokeless tobacco: Never Used  . Alcohol use 4.2 oz/week    4 Glasses of wine, 3 Standard drinks or equivalent per week     Comment: Wine 3-4 times a week   . Drug use: No  . Sexual activity: No   Other Topics Concern  . None   Social History Narrative   Married   Walks occasionally with house work    Outpatient Encounter Prescriptions as of 07/18/2017  Medication Sig  . aspirin EC 81 MG tablet Take 81 mg by mouth daily.  Marland Kitchen atorvastatin (LIPITOR) 40 MG tablet Take 1 tablet (40 mg total) by mouth daily at 6 PM.  . busPIRone (BUSPAR) 5 MG tablet Take 1 tablet (5 mg total) by mouth 2 (two) times daily.  . calcium carbonate (CALCIUM 600) 600 MG TABS Take 600 mg by mouth daily.    . carvedilol (COREG) 3.125 MG tablet TAKE ONE TABLET TWICE DAILY WITH A MEAL  . cetirizine (ZYRTEC) 10 MG tablet Take 10 mg by mouth  daily as needed for allergies.   . citalopram (CELEXA) 20 MG tablet Take 1 tablet (20 mg total) by mouth daily.  . furosemide (LASIX) 20 MG tablet TAKE ONE (1) TABLET EACH DAY  . Multiple Vitamin (MULTIVITAMIN) tablet Take 1 tablet by mouth daily.    . nitroGLYCERIN (NITROSTAT) 0.4 MG SL tablet Place 1 tablet (0.4 mg total) under the tongue every 5 (five) minutes as needed.  . Omega-3 Fatty Acids (FISH OIL) 1200 MG CAPS Take 1,200 mg by mouth daily.  . traZODone (DESYREL) 50 MG tablet Take 1 tablet (50 mg total) by mouth at bedtime as needed for sleep.  . [DISCONTINUED] clopidogrel (PLAVIX) 75 MG tablet Take 1 tablet (75 mg total) by mouth daily.  . [DISCONTINUED] potassium chloride SA (K-DUR,KLOR-CON) 20 MEQ tablet TAKE ONE (1) TABLET EACH DAY  . [DISCONTINUED] sacubitril-valsartan (ENTRESTO) 24-26 MG Take 1 tablet by mouth 2 (two) times daily.   Facility-Administered Encounter Medications as of 07/18/2017  Medication  . pneumococcal 13-valent conjugate vaccine (PREVNAR 13) injection 0.5 mL    Review of Systems  Constitutional: Negative for appetite change and unexpected weight change.  HENT: Negative for congestion and sinus pressure.   Respiratory: Negative for cough, chest tightness and shortness of breath.   Cardiovascular: Negative for chest pain, palpitations and leg swelling.  Gastrointestinal: Negative for abdominal pain, diarrhea, nausea and vomiting.  Genitourinary: Negative for difficulty urinating and dysuria.  Musculoskeletal: Negative for joint swelling and myalgias.  Skin: Negative for color change and rash.  Neurological: Negative for dizziness, light-headedness and headaches.  Psychiatric/Behavioral: Negative for agitation and dysphoric mood.       Objective:    Physical Exam  Constitutional: She appears well-developed and well-nourished. No distress.  HENT:  Nose: Nose normal.  Mouth/Throat: Oropharynx is clear and moist.  Neck: Neck supple. No thyromegaly  present.  Cardiovascular: Normal rate and regular rhythm.   Pulmonary/Chest: Breath sounds normal. No respiratory distress. She has no wheezes.  Abdominal: Soft. Bowel sounds are normal. There is no tenderness.  Musculoskeletal: She exhibits no edema or tenderness.  Lymphadenopathy:    She has no cervical adenopathy.  Skin: No rash noted. No erythema.  Psychiatric: She has a normal mood and affect. Her behavior is normal.    BP 120/80 (BP Location: Left Arm, Patient Position: Sitting, Cuff Size: Normal)   Pulse 84   Temp 97.6 F (36.4 C) (Oral)  Resp 20   Ht 5' 4.17" (1.63 m)   Wt 130 lb (59 kg)   SpO2 98%   BMI 22.19 kg/m  Wt Readings from Last 3 Encounters:  07/18/17 130 lb (59 kg)  05/16/17 129 lb 4 oz (58.6 kg)  02/13/17 130 lb 4 oz (59.1 kg)     Lab Results  Component Value Date   WBC 4.2 12/10/2016   HGB 15.2 12/10/2016   HCT 44.4 12/10/2016   PLT 225 12/10/2016   GLUCOSE 109 (H) 07/18/2017   CHOL 220 (H) 07/18/2017   TRIG 87.0 07/18/2017   HDL 72.80 07/18/2017   LDLCALC 130 (H) 07/18/2017   ALT 10 07/18/2017   AST 14 07/18/2017   NA 137 07/18/2017   K 5.0 07/18/2017   CL 103 07/18/2017   CREATININE 1.15 07/18/2017   BUN 10 07/18/2017   CO2 28 07/18/2017   TSH 2.71 07/18/2017   INR 0.98 12/10/2016   HGBA1C 5.1 12/11/2016    Ct Head Wo Contrast  Result Date: 12/10/2016 CLINICAL DATA:  Confusion and dysarthria EXAM: CT HEAD WITHOUT CONTRAST TECHNIQUE: Contiguous axial images were obtained from the base of the skull through the vertex without intravenous contrast. COMPARISON:  Brain MRI January 09, 2013 FINDINGS: Brain: There is mild diffuse atrophy, stable. There is no intracranial mass, hemorrhage, extra-axial fluid collection, or midline shift. There is mild patchy small vessel disease in the centra semiovale bilaterally. Elsewhere the gray-white compartments appear normal. No acute infarct evident. Vascular: No hyperdense vessel. There are foci of  atherosclerotic calcification in the carotid siphon regions. Skull: Bony calvarium appears intact. Sinuses/Orbits: There is mucosal thickening in multiple ethmoid air cells. Other visualized paranasal sinuses are clear. Visualized orbits appear symmetric bilaterally. Other: Mastoid air cells are clear. IMPRESSION: Stable mild atrophy with patchy periventricular small vessel disease. No intracranial mass, hemorrhage, or extra-axial fluid collection. No acute appearing infarct. Foci of arterial vascular calcification noted. There is mucosal thickening in multiple ethmoid air cells bilaterally. Electronically Signed   By: Lowella Grip III M.D.   On: 12/10/2016 11:33   Mr Jodene Nam Head Wo Contrast  Result Date: 12/11/2016 CLINICAL DATA:  New onset dysarthria and right upper extremity weakness beginning yesterday. EXAM: MRI HEAD WITHOUT CONTRAST MRA HEAD WITHOUT CONTRAST TECHNIQUE: Multiplanar, multiecho pulse sequences of the brain and surrounding structures were obtained without intravenous contrast. Angiographic images of the head were obtained using MRA technique without contrast. COMPARISON:  CT head without contrast 12/10/2016. MRI brain 01/09/2013. FINDINGS: MRI HEAD FINDINGS Brain: Scattered punctate left MCA territory acute infarcts are present. There scattered subcortical white matter infarcts. There is also infarction involving the posterior left insular cortex and sylvian fissure. Two additional punctate foci of acute cortical infarction are present in the left parietal lobe Vascular: The right vertebral artery flow void is absent. There is T1 shortening. This suggests slow or occluded flow. This may represent acute occlusion given the T1 shortening. Flow is present within the anterior circulation. Skull and upper cervical spine: The skullbase is normal. Midline sagittal structures are unremarkable. Sinuses/Orbits: Mild mucosal thickening is present in the ethmoid air cells bilaterally. The remaining  paranasal sinuses are clear. There is minimal fluid in the inferior left mastoid air cells. No obstructing nasopharyngeal lesion is present. A right lens replacement is noted. The globes and orbits are otherwise intact. MRA HEAD FINDINGS The internal carotid arteries are within normal limits from the high cervical segments through the ICA termini bilaterally. Left A1 segment is dominant.  Anterior communicating artery is patent. ACA branch vessels are within normal limits. The M1 segments are normal. There is a high-grade stenosis or occlusion of the superior right M2 division. Marked signal loss is present in the superior and inferior left M2 divisions proximally. More distal superior branch vessels are present. There is some artifact from patient motion at the level of the MCA bifurcations. Left vertebral artery is within normal limits. The left PICA origin is normal. The right vertebral artery is occluded. Minimal signal is present distally. A right AICA vessel is noted. The basilar artery is normal. Both posterior cerebral arteries originate from the basilar tip. Asymmetric attenuation of branch vessels is noted on the right. IMPRESSION: 1. Scattered foci of acute nonhemorrhagic infarction within the left MCA territory. 2. Acute infarcts involve the posterior left insular cortex and sylvian fissure. 3. Proximal M2 stenoses or occlusions just be on the bifurcations, left greater than right. 4. Right vertebral artery occlusion. This may be acute. No acute right cerebellar infarct is present. These results will be called to the ordering clinician or representative by the Radiologist Assistant, and communication documented in the PACS or zVision Dashboard. Electronically Signed   By: San Morelle M.D.   On: 12/11/2016 10:20   Mr Brain Wo Contrast  Result Date: 12/11/2016 CLINICAL DATA:  New onset dysarthria and right upper extremity weakness beginning yesterday. EXAM: MRI HEAD WITHOUT CONTRAST MRA HEAD  WITHOUT CONTRAST TECHNIQUE: Multiplanar, multiecho pulse sequences of the brain and surrounding structures were obtained without intravenous contrast. Angiographic images of the head were obtained using MRA technique without contrast. COMPARISON:  CT head without contrast 12/10/2016. MRI brain 01/09/2013. FINDINGS: MRI HEAD FINDINGS Brain: Scattered punctate left MCA territory acute infarcts are present. There scattered subcortical white matter infarcts. There is also infarction involving the posterior left insular cortex and sylvian fissure. Two additional punctate foci of acute cortical infarction are present in the left parietal lobe Vascular: The right vertebral artery flow void is absent. There is T1 shortening. This suggests slow or occluded flow. This may represent acute occlusion given the T1 shortening. Flow is present within the anterior circulation. Skull and upper cervical spine: The skullbase is normal. Midline sagittal structures are unremarkable. Sinuses/Orbits: Mild mucosal thickening is present in the ethmoid air cells bilaterally. The remaining paranasal sinuses are clear. There is minimal fluid in the inferior left mastoid air cells. No obstructing nasopharyngeal lesion is present. A right lens replacement is noted. The globes and orbits are otherwise intact. MRA HEAD FINDINGS The internal carotid arteries are within normal limits from the high cervical segments through the ICA termini bilaterally. Left A1 segment is dominant. Anterior communicating artery is patent. ACA branch vessels are within normal limits. The M1 segments are normal. There is a high-grade stenosis or occlusion of the superior right M2 division. Marked signal loss is present in the superior and inferior left M2 divisions proximally. More distal superior branch vessels are present. There is some artifact from patient motion at the level of the MCA bifurcations. Left vertebral artery is within normal limits. The left PICA origin  is normal. The right vertebral artery is occluded. Minimal signal is present distally. A right AICA vessel is noted. The basilar artery is normal. Both posterior cerebral arteries originate from the basilar tip. Asymmetric attenuation of branch vessels is noted on the right. IMPRESSION: 1. Scattered foci of acute nonhemorrhagic infarction within the left MCA territory. 2. Acute infarcts involve the posterior left insular cortex and sylvian fissure.  3. Proximal M2 stenoses or occlusions just be on the bifurcations, left greater than right. 4. Right vertebral artery occlusion. This may be acute. No acute right cerebellar infarct is present. These results will be called to the ordering clinician or representative by the Radiologist Assistant, and communication documented in the PACS or zVision Dashboard. Electronically Signed   By: San Morelle M.D.   On: 12/11/2016 10:20   US Carotid Bilateral  Result Date: 12/10/2016 CLINICAL DATA:  CVA. EXAM: BILATERAL CAROTID DUPLEX ULTRASOUND TECHNIQUE: Pearline Cables scale imaging, color Doppler and duplex ultrasound were performed of bilateral carotid and vertebral arteries in the neck. COMPARISON:  CT 12/10/2016. FINDINGS: Criteria: Quantification of carotid stenosis is based on velocity parameters that correlate the residual internal carotid diameter with NASCET-based stenosis levels, using the diameter of the distal internal carotid lumen as the denominator for stenosis measurement. The following velocity measurements were obtained: RIGHT ICA:  214/22 cm/sec CCA:  29/52 cm/sec SYSTOLIC ICA/CCA RATIO:  2.2 DIASTOLIC ICA/CCA RATIO:  2.0 ECA:  249 cm/sec LEFT ICA:  126/23 cm/sec CCA:  841/32 cm/sec SYSTOLIC ICA/CCA RATIO:  1.1 DIASTOLIC ICA/CCA RATIO:  1.2 ECA:  179 cm/sec RIGHT CAROTID ARTERY: Right common carotid artery and right carotid bifurcation moderate atherosclerotic vascular disease. Degree of stenosis 50-69%. RIGHT VERTEBRAL ARTERY:  Patent with antegrade flow. LEFT  CAROTID ARTERY: Mild to moderate left common carotid, carotid bifurcation atherosclerotic vascular disease. Degree of stenosis less than 50%. LEFT VERTEBRAL ARTERY:  Patent with antegrade flow. IMPRESSION: 1. Right common carotid and right carotid bifurcation moderate atherosclerotic vascular disease. Degree of stenosis 50-69%. 2. Mild moderate left common carotid, carotid bifurcation atherosclerotic vascular disease. Degree of stenosis less than 50%. Electronically Signed   By: Marcello Moores  Register   On: 12/10/2016 15:05       Assessment & Plan:   Problem List Items Addressed This Visit    AAA (abdominal aortic aneurysm) ([place])    Has been followed by vascular surgery.       Anemia, iron deficiency    Follow cbc.       Anxiety    Followed by psychiatry.        Carotid stenosis    Has been followed by [facility].       CKD (chronic kidney disease), stage III ([place])    Follow metabolic panel.  Avoid antiinflammatories.       Compliance with medication regimen    Discussed with her today regarding the importance of taking her medications regularly.  Caretaker plans to discuss with family.  Will need to discuss making sure she receives her medications daily.        Depression    Has been followed by psychiatry.        HTN (hypertension) ([facility])    Blood pressure under good control.  Continue same medication regimen.  Follow pressures.  Follow metabolic panel.        Relevant Orders   TSH (Completed)   Basic metabolic panel (Completed)   Hypercholesterolemia    On lipitor.  Low cholesterol diet and exercise.  Follow lipid panel and liver function tests.        Relevant Orders   Lipid panel (Completed)   Hepatic function panel (Completed)   Hyperparathyroidism ([place])    Has declined further evaluation.  Follow calcium level.        Memory change    Has documented dementia.  Has seen Dr Manuella Ghazi.  At her last visit, they instructed to taper off namenda.  Other Visit  Diagnoses    Screening for breast cancer    -  Primary   Relevant Orders   MM DIGITAL SCREENING BILATERAL   Need for vaccination against Streptococcus pneumoniae using pneumococcal conjugate vaccine 13       Relevant Medications   pneumococcal 13-valent conjugate vaccine (PREVNAR 13) injection 0.5 mL       [name], MD

## 2017-07-18 NOTE — Telephone Encounter (Signed)
Mammogram ordered pt informed

## 2017-07-18 NOTE — Telephone Encounter (Signed)
Need to schedule mammogram. Anytime in AM.

## 2017-07-18 NOTE — Telephone Encounter (Signed)
Patient in today for office visit wold like to make app for [facility]. Informed that someone would be calling her to make app.

## 2017-07-21 ENCOUNTER — Encounter: Payer: Self-pay | Admitting: Internal Medicine

## 2017-07-21 DIAGNOSIS — Z9189 Other specified personal risk factors, not elsewhere classified: Secondary | ICD-10-CM | POA: Insufficient documentation

## 2017-07-21 NOTE — Assessment & Plan Note (Signed)
Has documented dementia.  Has seen Dr Manuella Ghazi.  At her last visit, they instructed to taper off namenda.

## 2017-07-21 NOTE — Assessment & Plan Note (Signed)
Follow cbc.  

## 2017-07-21 NOTE — Assessment & Plan Note (Signed)
Discussed with her today regarding the importance of taking her medications regularly.  Caretaker plans to discuss with family.  Will need to discuss making sure she receives her medications daily.

## 2017-07-21 NOTE — Assessment & Plan Note (Signed)
Blood pressure under good control.  Continue same medication regimen.  Follow pressures.  Follow metabolic panel.   

## 2017-07-21 NOTE — Assessment & Plan Note (Signed)
Follow metabolic panel.  Avoid antiinflammatories.   

## 2017-07-21 NOTE — Assessment & Plan Note (Signed)
Has been followed by psychiatry.   

## 2017-07-21 NOTE — Assessment & Plan Note (Signed)
Has declined further evaluation.  Follow calcium level.

## 2017-07-21 NOTE — Assessment & Plan Note (Signed)
Has been followed by [facility].   °

## 2017-07-21 NOTE — Assessment & Plan Note (Signed)
On lipitor.  Low cholesterol diet and exercise.  Follow lipid panel and liver function tests.   

## 2017-07-21 NOTE — Assessment & Plan Note (Signed)
Followed by psychiatry 

## 2017-07-21 NOTE — Assessment & Plan Note (Signed)
Has been followed by vascular surgery.  

## 2017-07-22 ENCOUNTER — Other Ambulatory Visit: Payer: Self-pay | Admitting: Internal Medicine

## 2017-07-22 NOTE — Progress Notes (Signed)
Order placed for f/u calcium.  

## 2017-07-22 NOTE — Telephone Encounter (Signed)
Spoke to pt's daughter Sharyn Lull. Pt has dementia and not appropriate candidate for [facility]. Thank you

## 2017-07-23 DIAGNOSIS — E7800 Pure hypercholesterolemia, unspecified: Secondary | ICD-10-CM | POA: Diagnosis not present

## 2017-07-23 DIAGNOSIS — D509 Iron deficiency anemia, unspecified: Secondary | ICD-10-CM | POA: Diagnosis not present

## 2017-07-23 DIAGNOSIS — Z23 Encounter for immunization: Secondary | ICD-10-CM | POA: Diagnosis not present

## 2017-07-23 DIAGNOSIS — N183 Chronic kidney disease, stage 3 (moderate): Secondary | ICD-10-CM | POA: Diagnosis not present

## 2017-07-23 DIAGNOSIS — Z1231 Encounter for screening mammogram for malignant neoplasm of breast: Secondary | ICD-10-CM | POA: Diagnosis not present

## 2017-07-23 DIAGNOSIS — F329 Major depressive disorder, single episode, unspecified: Secondary | ICD-10-CM | POA: Diagnosis not present

## 2017-07-23 DIAGNOSIS — R413 Other amnesia: Secondary | ICD-10-CM | POA: Diagnosis not present

## 2017-07-23 DIAGNOSIS — F419 Anxiety disorder, unspecified: Secondary | ICD-10-CM | POA: Diagnosis not present

## 2017-07-23 DIAGNOSIS — I10 Essential (primary) hypertension: Secondary | ICD-10-CM | POA: Diagnosis not present

## 2017-07-23 DIAGNOSIS — I6529 Occlusion and stenosis of unspecified carotid artery: Secondary | ICD-10-CM | POA: Diagnosis not present

## 2017-07-23 DIAGNOSIS — E213 Hyperparathyroidism, unspecified: Secondary | ICD-10-CM | POA: Diagnosis not present

## 2017-07-23 DIAGNOSIS — I714 Abdominal aortic aneurysm, without rupture: Secondary | ICD-10-CM | POA: Diagnosis not present

## 2017-07-23 NOTE — Addendum Note (Signed)
Addended by: Francella Solian on: 07/23/2017 09:05 AM   Modules accepted: Orders

## 2017-08-02 ENCOUNTER — Ambulatory Visit
Admission: RE | Admit: 2017-08-02 | Discharge: 2017-08-02 | Disposition: A | Discharge status: Home / Self Care | Payer: PPO | Source: Ambulatory Visit | Attending: Internal Medicine | Admitting: Internal Medicine

## 2017-08-02 DIAGNOSIS — Z1231 Encounter for screening mammogram for malignant neoplasm of breast: Secondary | ICD-10-CM | POA: Diagnosis not present

## 2017-08-02 DIAGNOSIS — Z1239 Encounter for other screening for malignant neoplasm of breast: Secondary | ICD-10-CM

## 2017-08-16 ENCOUNTER — Ambulatory Visit: Payer: Self-pay | Admitting: Family

## 2017-10-25 ENCOUNTER — Encounter: Payer: Self-pay | Admitting: Internal Medicine

## 2017-10-25 ENCOUNTER — Telehealth: Payer: Self-pay

## 2017-10-25 DIAGNOSIS — Z0289 Encounter for other administrative examinations: Secondary | ICD-10-CM

## 2017-10-25 NOTE — Telephone Encounter (Signed)
Copied from [place]. Topic: Quick Communication - Appointment Cancellation >> Oct 25, 2017 10:18 AM Bea Graff, NT wrote: Patient called to cancel appointment scheduled for today. Patient has not rescheduled their appointment.    Route to department's PEC pool.

## 2017-12-10 ENCOUNTER — Other Ambulatory Visit: Payer: Self-pay

## 2017-12-10 ENCOUNTER — Emergency Department
Admission: EM | Admit: 2017-12-10 | Discharge: 2017-12-11 | Disposition: A | Discharge status: Home / Self Care | Payer: PPO | Attending: Emergency Medicine | Admitting: Emergency Medicine

## 2017-12-10 DIAGNOSIS — N183 Chronic kidney disease, stage 3 (moderate): Secondary | ICD-10-CM | POA: Diagnosis not present

## 2017-12-10 DIAGNOSIS — R55 Syncope and collapse: Secondary | ICD-10-CM | POA: Diagnosis not present

## 2017-12-10 DIAGNOSIS — Z87891 Personal history of nicotine dependence: Secondary | ICD-10-CM | POA: Insufficient documentation

## 2017-12-10 DIAGNOSIS — Z7982 Long term (current) use of aspirin: Secondary | ICD-10-CM | POA: Insufficient documentation

## 2017-12-10 DIAGNOSIS — I130 Hypertensive heart and chronic kidney disease with heart failure and stage 1 through stage 4 chronic kidney disease, or unspecified chronic kidney disease: Secondary | ICD-10-CM | POA: Insufficient documentation

## 2017-12-10 DIAGNOSIS — R109 Unspecified abdominal pain: Secondary | ICD-10-CM | POA: Diagnosis not present

## 2017-12-10 DIAGNOSIS — I5022 Chronic systolic (congestive) heart failure: Secondary | ICD-10-CM | POA: Insufficient documentation

## 2017-12-10 DIAGNOSIS — I252 Old myocardial infarction: Secondary | ICD-10-CM | POA: Diagnosis not present

## 2017-12-10 DIAGNOSIS — R197 Diarrhea, unspecified: Secondary | ICD-10-CM | POA: Diagnosis not present

## 2017-12-10 DIAGNOSIS — R112 Nausea with vomiting, unspecified: Secondary | ICD-10-CM | POA: Diagnosis not present

## 2017-12-10 DIAGNOSIS — Z955 Presence of coronary angioplasty implant and graft: Secondary | ICD-10-CM | POA: Insufficient documentation

## 2017-12-10 DIAGNOSIS — Z79899 Other long term (current) drug therapy: Secondary | ICD-10-CM | POA: Insufficient documentation

## 2017-12-10 DIAGNOSIS — E860 Dehydration: Secondary | ICD-10-CM | POA: Diagnosis not present

## 2017-12-10 DIAGNOSIS — E079 Disorder of thyroid, unspecified: Secondary | ICD-10-CM | POA: Diagnosis not present

## 2017-12-10 LAB — URINALYSIS, ROUTINE W REFLEX MICROSCOPIC
BILIRUBIN URINE: NEGATIVE
GLUCOSE, UA: NEGATIVE mg/dL
HGB URINE DIPSTICK: NEGATIVE
Ketones, ur: NEGATIVE mg/dL
Leukocytes, UA: NEGATIVE
Nitrite: NEGATIVE
PH: 5 (ref 5.0–8.0)
Protein, ur: NEGATIVE mg/dL
Specific Gravity, Urine: 1.011 (ref 1.005–1.030)

## 2017-12-10 LAB — COMPREHENSIVE METABOLIC PANEL
ALBUMIN: 3.7 g/dL (ref 3.5–5.0)
ALK PHOS: 62 U/L (ref 38–126)
ALT: 9 U/L — AB (ref 14–54)
AST: 19 U/L (ref 15–41)
Anion gap: 8 (ref 5–15)
BUN: 13 mg/dL (ref 6–20)
CALCIUM: 10 mg/dL (ref 8.9–10.3)
CHLORIDE: 108 mmol/L (ref 101–111)
CO2: 22 mmol/L (ref 22–32)
CREATININE: 1.04 mg/dL — AB (ref 0.44–1.00)
GFR calc non Af Amer: 49 mL/min — ABNORMAL LOW (ref >60)
GFR, EST AFRICAN AMERICAN: 57 mL/min — AB (ref >60)
GLUCOSE: 124 mg/dL — AB (ref 65–99)
Potassium: 3.4 mmol/L — ABNORMAL LOW (ref 3.5–5.1)
SODIUM: 138 mmol/L (ref 135–145)
Total Bilirubin: 1 mg/dL (ref 0.3–1.2)
Total Protein: 6.3 g/dL — ABNORMAL LOW (ref 6.5–8.1)

## 2017-12-10 LAB — CBC
HCT: 45.7 % (ref 35.0–47.0)
HEMOGLOBIN: 15.4 g/dL (ref 12.0–16.0)
MCH: 32.2 pg (ref 26.0–34.0)
MCHC: 33.8 g/dL (ref 32.0–36.0)
MCV: 95.5 fL (ref 80.0–100.0)
Platelets: 181 10*3/uL (ref 150–440)
RBC: 4.79 MIL/uL (ref 3.80–5.20)
RDW: 14.7 % — ABNORMAL HIGH (ref 11.5–14.5)
WBC: 4 10*3/uL (ref 3.6–11.0)

## 2017-12-10 LAB — LIPASE, BLOOD: Lipase: 35 U/L (ref 11–51)

## 2017-12-10 LAB — TROPONIN I: Troponin I: 0.04 ng/mL (ref <0.03)

## 2017-12-10 MED ORDER — SODIUM CHLORIDE 0.9 % IV BOLUS (SEPSIS)
1000.0000 mL | Freq: Once | INTRAVENOUS | Status: AC
  Administered 2017-12-10: 1000 mL via INTRAVENOUS

## 2017-12-10 MED ORDER — ONDANSETRON HCL 4 MG/2ML IJ SOLN
4.0000 mg | Freq: Once | INTRAMUSCULAR | Status: AC
  Administered 2017-12-10: 4 mg via INTRAVENOUS
  Filled 2017-12-10: qty 2

## 2017-12-10 NOTE — Discharge Instructions (Signed)
Please drink plenty of fluids and follow-up with your doctor in 2-3 days for recheck/reevaluation.  Return to the emergency department if you feel lightheaded, pass out, continued to have significant diarrhea or any other symptom personally concerning to yourself.

## 2017-12-10 NOTE — ED Triage Notes (Signed)
Pt to the er for diarrhea x 2 days, syncopal episode. Pt hit life alert and then EMS came out. Episode of vomiting, chest pain, hx of 2 MIs, memory issues, hx of TIA last year.

## 2017-12-10 NOTE — ED Provider Notes (Signed)
[facility] Emergency Department Provider Note  Time seen: 8:58 PM  I have reviewed the triage vital signs and the nursing notes.   HISTORY  Chief Complaint Diarrhea    HPI Karen Collins is a 84 y.o. female with a past medical history of AAA, anxiety, CAD, CKD, CHF, dementia, gastric reflux, hypertension, hyperlipidemia, presents to the emergency department after syncopal episode.  According to the patient and EMS report the patient has been expensing diarrhea over the past 2 days.  Had syncopal episode today, contacted her sister who called EMS to bring the patient to the emergency department.  Patient cannot recall how often she is having diarrhea states she has mild memory impairment.  Denies any vomiting.  Denies black or bloody stool.  Denies dysuria.  Denies chest pain.  Largely negative review of systems otherwise.  Awaiting sister arrival as well.  Patient coming from home.   Past Medical History:  Diagnosis Date  . AAA (abdominal aortic aneurysm) ([place])    s/p stent   . Anxiety   . Arthritis   . CAD (coronary artery disease)   . Chronic kidney disease (CKD), stage III (moderate) (HCC)   . Chronic systolic congestive heart failure (HCC)    EF of 35 %   . Dementia   . Depression   . Diverticulitis    H/O  . GERD (gastroesophageal reflux disease)   . GI bleed   . Hx of migraines   . Hx: UTI (urinary tract infection)   . Hyperlipidemia   . Hypertension   . Kidney stones   . MI (myocardial infarction) ([place])    Non-ST elevation MI  . Renal cell carcinoma   . Thyroid disease     Patient Active Problem List   Diagnosis Date Noted  . Compliance with medication regimen 07/21/2017  . Chronic systolic heart failure ([place]) 05/25/2016  . HTN (hypertension) 05/25/2016  . Hypokalemia 05/10/2016  . Hypocalcemia 05/10/2016  . Health care maintenance 11/15/2014  . Urinary frequency 07/11/2014  . Leukopenia 07/11/2014  . Sleeping difficulty  03/18/2014  . Adaptive colitis 03/11/2014  . Vaginitis 08/09/2013  . Hospital discharge follow-up 07/23/2013  . Anxiety 07/22/2013  . Depression 06/01/2013  . AAA (abdominal aortic aneurysm) ([place]) 06/01/2013  . History of renal cell cancer 06/01/2013  . Hyperparathyroidism ([place]) 06/01/2013  . CKD (chronic kidney disease), stage III ([place]) 06/01/2013  . Memory change 06/01/2013  . Abdominal pain 06/01/2013  . History of GI bleed 06/01/2013  . Anemia, iron deficiency 06/01/2013  . Portal vein aneurysm 06/01/2013  . ISCHEMIC CARDIOMYOPATHY 11/27/2010  . CAROTID BRUIT, LEFT 11/09/2010  . Hypercholesterolemia 11/08/2010  . Coronary atherosclerosis 11/08/2010  . Carotid stenosis 11/08/2010  . GI BLEEDING 11/08/2010    Past Surgical History:  Procedure Laterality Date  . ABDOMINAL AORTIC ANEURYSM REPAIR     s/p stent  . ABDOMINAL AORTIC ENDOVASCULAR STENT GRAFT    . CARDIAC CATHETERIZATION    . CARDIAC CATHETERIZATION  09/18/2010  . CHOLECYSTECTOMY    . CORONARY STENT PLACEMENT  09/18/2010   CAD; MI s/p stent  . LAPAROSCOPIC PARTIAL NEPHRECTOMY  2010  . NEPHRECTOMY  10/2008   Right  . PARATHYROIDECTOMY    . SHOULDER SURGERY  07/2010   Right  . THYROIDECTOMY      Prior to Admission medications   Medication Sig Start Date End Date Taking? Authorizing Provider  aspirin EC 81 MG tablet Take 81 mg by mouth daily.    [provider]  atorvastatin (LIPITOR) 40 MG tablet Take 1 tablet (40 mg total) by mouth daily at 6 PM. 02/13/17   Alisa Graff, FNP  busPIRone (BUSPAR) 5 MG tablet Take 1 tablet (5 mg total) by mouth 2 (two) times daily. 06/21/16   Rainey Pines, MD  calcium carbonate (CALCIUM 600) 600 MG TABS Take 600 mg by mouth daily.      [provider]  carvedilol (COREG) 3.125 MG tablet TAKE ONE TABLET TWICE DAILY WITH A MEAL 02/13/17   [name] A, FNP  cetirizine (ZYRTEC) 10 MG tablet Take 10 mg by mouth daily as needed for allergies.     [provider]  citalopram (CELEXA) 20 MG tablet Take 1 tablet (20 mg total) by mouth daily. 06/21/16   Rainey Pines, MD  furosemide (LASIX) 20 MG tablet TAKE ONE (1) TABLET EACH DAY 02/13/17   [name] A, FNP  Multiple Vitamin (MULTIVITAMIN) tablet Take 1 tablet by mouth daily.      [provider]  nitroGLYCERIN (NITROSTAT) 0.4 MG SL tablet Place 1 tablet (0.4 mg total) under the tongue every 5 (five) minutes as needed. 05/25/16   Alisa Graff, FNP  Omega-3 Fatty Acids (FISH OIL) 1200 MG CAPS Take 1,200 mg by mouth daily.    [provider]  traZODone (DESYREL) 50 MG tablet Take 1 tablet (50 mg total) by mouth at bedtime as needed for sleep. 06/21/16   Rainey Pines, MD    Allergies  Allergen Reactions  . Tramadol Rash    Family History  Problem Relation Age of Onset  . Arthritis Mother   . Heart disease Mother   . Pulmonary fibrosis Mother   . Heart disease Father   . Hodgkin's lymphoma Father   . Breast cancer Sister   . Hyperlipidemia Sister     Social History Social History   Tobacco Use  . Smoking status: Former Smoker    Packs/day: 1.00    Years: 30.00    Pack years: 30.00    Last attempt to quit: 10/01/2005    Years since quitting: 12.2  . Smokeless tobacco: Never Used  Substance Use Topics  . Alcohol use: Yes    Alcohol/week: 4.2 oz    Types: 4 Glasses of wine, 3 Standard drinks or equivalent per week    Comment: Wine 3-4 times a week   . Drug use: No    Review of Systems Constitutional: Positive for loss of consciousness Eyes: Negative for visual complaints ENT: Negative for congestion Cardiovascular: Negative for chest pain. Respiratory: Negative for shortness of breath. Gastrointestinal: States mild abdominal cramping.  Denies vomiting, positive for diarrhea times 2 days. Genitourinary: Negative for dysuria Musculoskeletal: Negative for musculoskeletal complaints Skin: Negative for skin complaints  Neurological: Negative for  headache All other ROS negative  ____________________________________________   PHYSICAL EXAM:  VITAL SIGNS: ED Triage Vitals  Enc Vitals Group     BP --      Pulse Rate 12/10/17 2056 64     Resp 12/10/17 2056 13     Temp --      Temp src --      SpO2 --      Weight 12/10/17 2057 125 lb (56.7 kg)     Height 12/10/17 2057 5\' 4"  (1.626 m)     Head Circumference --      Peak Flow --      Pain Score 12/10/17 2057 5     Pain Loc --  Pain Edu? --      Excl. in [place]? --     Constitutional: Alert. Well appearing and in no distress. Eyes: Normal exam ENT   Head: Normocephalic and atraumatic   Mouth/Throat: Mucous membranes are moist. Cardiovascular: Normal rate, regular rhythm.  Respiratory: Normal respiratory effort without tachypnea nor retractions. Breath sounds are clear Gastrointestinal: Soft, nontender to palpation.  No distention.  Patient does have hyperactive bowel sounds. Musculoskeletal: Nontender with normal range of motion in all extremities.  Neurologic:  Normal speech and language. No gross focal neurologic deficits  Skin:  Skin is warm, dry and intact.  Psychiatric: Mood and affect are normal.   ____________________________________________    EKG  EKG #1 reviewed and interpreted by myself shows sinus rhythm at 78 bpm with a narrow QRS, normal axis, slight QTC prolongation, nonspecific ST changes.  Electrical interference making interpretation somewhat difficult computer is reading acute MI I do not appreciate inferior elevations.  We will repeat an EKG.  EKG #2 reviewed and interpreted by myself shows normal sinus rhythm at 66 bpm with a narrow QRS, normal axis, prolonged PR interval consistent with first-degree AV block, slightly prolonged QTC.  ____________________________________________   INITIAL IMPRESSION / ASSESSMENT AND PLAN / ED COURSE  Pertinent labs & imaging results that were available during my care of the patient were reviewed by me  and considered in my medical decision making (see chart for details).  Patient presents to the emergency department after syncopal episode, diarrhea times 2 days.  Differential would include orthostatic syncope, dehydration, cardiogenic syncope, ACS, hypovolemia.  We will IV hydrate, treat with Zofran and continue to closely monitor.  We will check labs, stool culture if able.  We will continue to closely monitor throughout the patient's ER stay.  The patient's labs are largely at her baseline.  Troponin 0.04 although largely unchanged from baseline.  We will continue with IV hydration.  I discussed the results with the patient as well as family.  Suspect likely dehydration due to diarrhea leading to the event tonight.  Overall the patient appears very well, talkative and joking, nontoxic in appearance.  Urinalysis pending  Largely normal.  Discussed these results with the patient and family.  We will discharge home with plenty of oral hydration and PCP follow-up.  Patient and family agreeable to this plan of care.  ____________________________________________   FINAL CLINICAL IMPRESSION(S) / ED DIAGNOSES  Diarrhea Syncope    Harvest Dark, MD 12/10/17 2343

## 2017-12-10 NOTE — ED Notes (Signed)
Date and time results received: 12/10/17 2224  Test: Troponin Critical Value: 0.04  Name of Provider Notified: Dr. Kerman Passey  Orders Received? Or Actions Taken?: Acknowledged

## 2017-12-12 ENCOUNTER — Telehealth: Payer: Self-pay

## 2017-12-12 NOTE — Telephone Encounter (Signed)
Copied from [place] [id number]. Topic: General - Other >> Dec 11, 2017 10:04 AM Carolyn Stare wrote:  Pt daughter Sharyn Lull will be in town on 4/11 & 01/10/18 and would like for her Mom to see Dr Nicki Reaper while she is here. There is nothing available.   [phone]

## 2017-12-13 NOTE — Telephone Encounter (Signed)
Can we get her in one of those days?

## 2017-12-13 NOTE — Telephone Encounter (Signed)
The patient has been scheduled

## 2017-12-13 NOTE — Telephone Encounter (Signed)
Can you please schedule pt for 01/09/18 @ 11:30.  Pt daughter is aware of appt

## 2018-01-09 ENCOUNTER — Ambulatory Visit (INDEPENDENT_AMBULATORY_CARE_PROVIDER_SITE_OTHER): Payer: PPO | Admitting: Internal Medicine

## 2018-01-09 ENCOUNTER — Encounter: Payer: Self-pay | Admitting: Internal Medicine

## 2018-01-09 VITALS — BP 140/88 | HR 72 | Temp 97.9°F | Resp 18 | Wt 132.2 lb

## 2018-01-09 DIAGNOSIS — Z9189 Other specified personal risk factors, not elsewhere classified: Secondary | ICD-10-CM

## 2018-01-09 DIAGNOSIS — F331 Major depressive disorder, recurrent, moderate: Secondary | ICD-10-CM | POA: Diagnosis not present

## 2018-01-09 DIAGNOSIS — I5022 Chronic systolic (congestive) heart failure: Secondary | ICD-10-CM | POA: Diagnosis not present

## 2018-01-09 DIAGNOSIS — F419 Anxiety disorder, unspecified: Secondary | ICD-10-CM

## 2018-01-09 DIAGNOSIS — I6529 Occlusion and stenosis of unspecified carotid artery: Secondary | ICD-10-CM | POA: Diagnosis not present

## 2018-01-09 DIAGNOSIS — I2510 Atherosclerotic heart disease of native coronary artery without angina pectoris: Secondary | ICD-10-CM | POA: Diagnosis not present

## 2018-01-09 DIAGNOSIS — I10 Essential (primary) hypertension: Secondary | ICD-10-CM | POA: Diagnosis not present

## 2018-01-09 DIAGNOSIS — D509 Iron deficiency anemia, unspecified: Secondary | ICD-10-CM | POA: Diagnosis not present

## 2018-01-09 DIAGNOSIS — I714 Abdominal aortic aneurysm, without rupture, unspecified: Secondary | ICD-10-CM

## 2018-01-09 DIAGNOSIS — E7800 Pure hypercholesterolemia, unspecified: Secondary | ICD-10-CM

## 2018-01-09 DIAGNOSIS — N183 Chronic kidney disease, stage 3 unspecified: Secondary | ICD-10-CM

## 2018-01-09 DIAGNOSIS — E213 Hyperparathyroidism, unspecified: Secondary | ICD-10-CM

## 2018-01-09 DIAGNOSIS — D72819 Decreased white blood cell count, unspecified: Secondary | ICD-10-CM

## 2018-01-09 DIAGNOSIS — R413 Other amnesia: Secondary | ICD-10-CM

## 2018-01-09 LAB — HEPATIC FUNCTION PANEL
ALK PHOS: 70 U/L (ref 39–117)
ALT: 9 U/L (ref 0–35)
AST: 14 U/L (ref 0–37)
Albumin: 4.3 g/dL (ref 3.5–5.2)
BILIRUBIN DIRECT: 0.1 mg/dL (ref 0.0–0.3)
BILIRUBIN TOTAL: 0.8 mg/dL (ref 0.2–1.2)
Total Protein: 6.8 g/dL (ref 6.0–8.3)

## 2018-01-09 LAB — BASIC METABOLIC PANEL
BUN: 15 mg/dL (ref 6–23)
CO2: 26 mEq/L (ref 19–32)
CREATININE: 1.16 mg/dL (ref 0.40–1.20)
Calcium: 10.6 mg/dL — ABNORMAL HIGH (ref 8.4–10.5)
Chloride: 107 mEq/L (ref 96–112)
GFR: 47.58 mL/min — AB (ref >60.00)
GLUCOSE: 94 mg/dL (ref 70–99)
POTASSIUM: 4.6 meq/L (ref 3.5–5.1)
Sodium: 139 mEq/L (ref 135–145)

## 2018-01-09 NOTE — Progress Notes (Signed)
Patient ID: Karen Collins, female   DOB: 1933-11-12, 84 y.o.   MRN: [id number]   Subjective:    Patient ID: Karen Collins, female    DOB: 02/16/1934, 84 y.o.   MRN: [id number]  HPI  Patient here for a scheduled follow up.  She is accompanied by her daughter Sharyn Lull.  History obtained from both of them.  She is doing better.  Feels better.  Waking regularly.  A lot more active.  Eating well.  Has a good appetite.  No nausea or vomiting.  No chest pain.  No sob.  No acid reflux.  No abdominal pain.  Bowels moving.  Overall feels better.  Still memory issues. Repeats herself a lot.  Does not have a caretaker staying with her now.  Does not appear that she has been taking her medications regularly.  Daughter brings in a pill pack from 11/2017.  Still with a few tablets in the pack.  Discussed at length with the daughter and Ms Alviar.  Discussed concern regarding her safety and not taking her medications regularly.  Her children check on her daily.  They live in [place].  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     Past Medical History:  Diagnosis Date  . AAA (abdominal aortic aneurysm) ([place])    s/p stent   . Anxiety   . Arthritis   . CAD (coronary artery disease)   . Chronic kidney disease (CKD), stage III (moderate) (HCC)   . Chronic systolic congestive heart failure (HCC)    EF of 35 %   . Dementia   . Depression   . Diverticulitis    H/O  . GERD (gastroesophageal reflux disease)   . GI bleed   . Hx of migraines   . Hx: UTI (urinary tract infection)   . Hyperlipidemia   . Hypertension   . Kidney stones   . MI (myocardial infarction) ([place])    Non-ST elevation MI  . Renal cell carcinoma   . Thyroid disease    Past Surgical History:  Procedure Laterality Date  . ABDOMINAL AORTIC ANEURYSM REPAIR     s/p stent  . ABDOMINAL AORTIC ENDOVASCULAR STENT GRAFT    . CARDIAC CATHETERIZATION    . CARDIAC CATHETERIZATION  09/18/2010    . CHOLECYSTECTOMY    . CORONARY STENT PLACEMENT  09/18/2010   CAD; MI s/p stent  . LAPAROSCOPIC PARTIAL NEPHRECTOMY  2010  . NEPHRECTOMY  10/2008   Right  . PARATHYROIDECTOMY    . SHOULDER SURGERY  07/2010   Right  . THYROIDECTOMY     Family History  Problem Relation Age of Onset  . Arthritis Mother   . Heart disease Mother   . Pulmonary fibrosis Mother   . Heart disease Father   . Hodgkin's lymphoma Father   . Breast cancer Sister   . Hyperlipidemia Sister    Social History   Socioeconomic History  . Marital status: Divorced    Spouse name: Not on file  . Number of children: Not on file  . Years of education: Not on file  . Highest education level: Not on file  Occupational History  . Occupation: Retired    Fish farm manager: RETIRED  Social Needs  . Financial resource strain: Not on file  . Food insecurity:    Worry: Not on file    Inability: Not on file  . Transportation needs:    Medical: Not on file  Non-medical: Not on file  Tobacco Use  . Smoking status: Former Smoker    Packs/day: 1.00    Years: 30.00    Pack years: 30.00    Last attempt to quit: 10/01/2005    Years since quitting: 12.2  . Smokeless tobacco: Never Used  Substance and Sexual Activity  . Alcohol use: Yes    Alcohol/week: 4.2 oz    Types: 4 Glasses of wine, 3 Standard drinks or equivalent per week    Comment: Wine 3-4 times a week   . Drug use: No  . Sexual activity: Never  Lifestyle  . Physical activity:    Days per week: Not on file    Minutes per session: Not on file  . Stress: Not on file  Relationships  . Social connections:    Talks on phone: Not on file    Gets together: Not on file    Attends religious service: Not on file    Active member of club or organization: Not on file    Attends meetings of clubs or organizations: Not on file    Relationship status: Not on file  Other Topics Concern  . Not on file  Social History Narrative   Married   Walks occasionally with house  work    Outpatient Encounter Medications as of 01/09/2018  Medication Sig  . aspirin EC 81 MG tablet Take 81 mg by mouth daily.  Marland Kitchen atorvastatin (LIPITOR) 40 MG tablet Take 1 tablet (40 mg total) by mouth daily at 6 PM.  . carvedilol (COREG) 3.125 MG tablet TAKE ONE TABLET TWICE DAILY WITH A MEAL  . clopidogrel (PLAVIX) 75 MG tablet   . ENTRESTO 24-26 MG   . [DISCONTINUED] busPIRone (BUSPAR) 5 MG tablet Take 1 tablet (5 mg total) by mouth 2 (two) times daily.  . [DISCONTINUED] calcium carbonate (CALCIUM 600) 600 MG TABS Take 600 mg by mouth daily.    . [DISCONTINUED] cetirizine (ZYRTEC) 10 MG tablet Take 10 mg by mouth daily as needed for allergies.   . [DISCONTINUED] citalopram (CELEXA) 20 MG tablet Take 1 tablet (20 mg total) by mouth daily.  . [DISCONTINUED] furosemide (LASIX) 20 MG tablet TAKE ONE (1) TABLET EACH DAY  . [DISCONTINUED] Multiple Vitamin (MULTIVITAMIN) tablet Take 1 tablet by mouth daily.    . [DISCONTINUED] nitroGLYCERIN (NITROSTAT) 0.4 MG SL tablet Place 1 tablet (0.4 mg total) under the tongue every 5 (five) minutes as needed.  . [DISCONTINUED] Omega-3 Fatty Acids (FISH OIL) 1200 MG CAPS Take 1,200 mg by mouth daily.  . [DISCONTINUED] potassium chloride SA (K-DUR,KLOR-CON) 20 MEQ tablet   . [DISCONTINUED] traZODone (DESYREL) 50 MG tablet Take 1 tablet (50 mg total) by mouth at bedtime as needed for sleep.   No facility-administered encounter medications on file as of 01/09/2018.     Review of Systems  Constitutional: Negative for appetite change and unexpected weight change.  HENT: Negative for congestion and sinus pressure.   Respiratory: Negative for cough, chest tightness and shortness of breath.   Cardiovascular: Negative for chest pain, palpitations and leg swelling.  Gastrointestinal: Negative for abdominal pain, diarrhea, nausea and vomiting.  Genitourinary: Negative for difficulty urinating and dysuria.  Musculoskeletal: Negative for joint swelling and  myalgias.  Skin: Negative for color change and rash.  Neurological: Negative for dizziness, light-headedness and headaches.  Psychiatric/Behavioral: Negative for agitation and dysphoric mood.       Objective:    Physical Exam  Constitutional: She appears well-developed and well-nourished. No distress.  HENT:  Nose: Nose normal.  Mouth/Throat: Oropharynx is clear and moist.  Neck: Neck supple. No thyromegaly present.  Cardiovascular: Normal rate and regular rhythm.  Pulmonary/Chest: Breath sounds normal. No respiratory distress. She has no wheezes.  Abdominal: Soft. Bowel sounds are normal. There is no tenderness.  Musculoskeletal: She exhibits no edema or tenderness.  Lymphadenopathy:    She has no cervical adenopathy.  Skin: No rash noted. No erythema.  Psychiatric: She has a normal mood and affect. Her behavior is normal.  Repeats herself a lot.    BP 140/88 (BP Location: Right Arm, Patient Position: Sitting, Cuff Size: Normal)   Pulse 72   Temp 97.9 F (36.6 C) (Oral)   Resp 18   Wt 132 lb 3.2 oz (60 kg)   SpO2 97%   BMI 22.69 kg/m  Wt Readings from Last 3 Encounters:  01/09/18 132 lb 3.2 oz (60 kg)  12/10/17 125 lb (56.7 kg)  07/18/17 130 lb (59 kg)     Lab Results  Component Value Date   WBC 4.0 12/10/2017   HGB 15.4 12/10/2017   HCT 45.7 12/10/2017   PLT 181 12/10/2017   GLUCOSE 94 01/09/2018   CHOL 220 (H) 07/18/2017   TRIG 87.0 07/18/2017   HDL 72.80 07/18/2017   LDLCALC 130 (H) 07/18/2017   ALT 9 01/09/2018   AST 14 01/09/2018   NA 139 01/09/2018   K 4.6 01/09/2018   CL 107 01/09/2018   CREATININE 1.16 01/09/2018   BUN 15 01/09/2018   CO2 26 01/09/2018   TSH 2.71 07/18/2017   INR 0.98 12/10/2016   HGBA1C 5.1 12/11/2016       Assessment & Plan:   Problem List Items Addressed This Visit    AAA (abdominal aortic aneurysm) ([place])    Previously followed by vascular surgery.  Will need f/u scheduled.        Relevant Medications   ENTRESTO  24-26 MG   Anemia, iron deficiency    Follow cbc.        Anxiety    Has been followed by psychiatry.  Has not been taking any medication lately.  Family feels she is more lucid and doing better.  Since off medication, will remain off.  Follow closely.        Carotid stenosis    Previously evaluated by [facility] and cardiology.  Discussed the need for f/u with cardiology.        Relevant Medications   ENTRESTO 24-26 MG   Chronic systolic heart failure (HCC) - Primary (Chronic)    No evidence of volume overload now.  Will continue cardiac medications.  Has been off lasix and doing well.  Follow.  Continue entresto.        Relevant Medications   ENTRESTO 24-26 MG   Other Relevant Orders   Hepatic function panel (Completed)   Basic metabolic panel (Completed)   CKD (chronic kidney disease), stage III (HCC)    Avoid antiinflammatories.  Follow metabolic panel.        Compliance with medication regimen    Discussed this at length with the daughter and pt.  Discussed my concern regarding her not taking her medication.  Will have PACE evaluated.  Daughter agrees.  Will assess needs in the home.        Coronary atherosclerosis    Has seen Dr Rockey Situ.  Discussed the need for f/u with Dr Rockey Situ and with heart failure clinic.        Relevant Medications  ENTRESTO 24-26 MG   HTN (hypertension) (Chronic)    Blood pressure as outlined.  On no medication.  Restart listed medications.  Follow metabolic panel.       Relevant Medications   ENTRESTO 24-26 MG   Hypercholesterolemia    Restart lipitor.  Follow lipid panel and liver function tests.        Relevant Medications   ENTRESTO 24-26 MG   Hyperparathyroidism ([place])    Declined further evaluation.  Recheck calcium level.       Leukopenia    Follow cbc.        MDD (major depressive disorder), recurrent episode, moderate ([place])    Not on any medication currently.  Pt and family feel she is doing better.  Will remain off for now.   Follow.        Memory change    Has documented dementia.  Has seen Dr Manuella Ghazi.  Last visit with him, instructed to taper off namenda.  Follow.  Discussed her home situation.  Will have PACE evaluated.  Discussed concern regarding medication compliance.           I spent 40 minutes with the patient and more than 50% of the time was spent in consultation regarding the above.  Time spent discussing current concerns and going over medications and non compliance.  Time spent discussing plans for help and evaluation in the home and with going over treatment and f/u plan.     [name], MD

## 2018-01-10 ENCOUNTER — Other Ambulatory Visit: Payer: Self-pay | Admitting: Internal Medicine

## 2018-01-10 DIAGNOSIS — E213 Hyperparathyroidism, unspecified: Secondary | ICD-10-CM

## 2018-01-10 DIAGNOSIS — E7800 Pure hypercholesterolemia, unspecified: Secondary | ICD-10-CM

## 2018-01-10 NOTE — Progress Notes (Signed)
Orders placed for f/u labs.  

## 2018-01-12 ENCOUNTER — Encounter: Payer: Self-pay | Admitting: Internal Medicine

## 2018-01-12 DIAGNOSIS — F331 Major depressive disorder, recurrent, moderate: Secondary | ICD-10-CM | POA: Insufficient documentation

## 2018-01-12 NOTE — Assessment & Plan Note (Signed)
Follow cbc.  

## 2018-01-12 NOTE — Assessment & Plan Note (Signed)
Has documented dementia.  Has seen Dr Manuella Ghazi.  Last visit with him, instructed to taper off namenda.  Follow.  Discussed her home situation.  Will have PACE evaluated.  Discussed concern regarding medication compliance.

## 2018-01-12 NOTE — Assessment & Plan Note (Signed)
No evidence of volume overload now.  Will continue cardiac medications.  Has been off lasix and doing well.  Follow.  Continue entresto.

## 2018-01-12 NOTE — Assessment & Plan Note (Signed)
Has been followed by psychiatry.  Has not been taking any medication lately.  Family feels she is more lucid and doing better.  Since off medication, will remain off.  Follow closely.

## 2018-01-12 NOTE — Assessment & Plan Note (Signed)
Previously evaluated by [facility] and cardiology.  Discussed the need for f/u with cardiology.

## 2018-01-12 NOTE — Assessment & Plan Note (Signed)
Blood pressure as outlined.  On no medication.  Restart listed medications.  Follow metabolic panel.

## 2018-01-12 NOTE — Assessment & Plan Note (Signed)
Declined further evaluation.  Recheck calcium level.

## 2018-01-12 NOTE — Assessment & Plan Note (Signed)
Previously followed by vascular surgery.  Will need f/u scheduled.

## 2018-01-12 NOTE — Assessment & Plan Note (Signed)
Avoid antiinflammatories.  Follow metabolic panel.  

## 2018-01-12 NOTE — Assessment & Plan Note (Signed)
Restart lipitor.  Follow lipid panel and liver function tests.   

## 2018-01-12 NOTE — Assessment & Plan Note (Signed)
Has seen Dr Rockey Situ.  Discussed the need for f/u with Dr Rockey Situ and with heart failure clinic.

## 2018-01-12 NOTE — Assessment & Plan Note (Signed)
Discussed this at length with the daughter and pt.  Discussed my concern regarding her not taking her medication.  Will have PACE evaluated.  Daughter agrees.  Will assess needs in the home.

## 2018-01-12 NOTE — Assessment & Plan Note (Signed)
Not on any medication currently.  Pt and family feel she is doing better.  Will remain off for now.  Follow.

## 2018-01-21 ENCOUNTER — Telehealth: Payer: Self-pay

## 2018-01-21 NOTE — Telephone Encounter (Signed)
Copied from [place] [id number]. Topic: Inquiry >> Jan 21, 2018  2:28 PM Conception Chancy, NT wrote: Reason for CRM: patient daughter Sharyn Lull is calling and would like a call back in regards to [place] setting her mother up for the pace program. Please advise. [phone]

## 2018-01-22 NOTE — Telephone Encounter (Signed)
Returned patient daughter phone call and advised that referral to [facility] has been placed and that I would contact [facility] directly, and make sure referral received. Referral has been verified [facility] , has back log of referrals and is currently working to catch up. Advised patient daughter to call back if not heard from by 2 weeks.

## 2018-01-24 ENCOUNTER — Telehealth: Payer: Self-pay

## 2018-01-24 NOTE — Telephone Encounter (Signed)
Spoke with Butch Penny at [facility] she stated she is having a hard time reaching daughter has spoken with patient and patient is stating she does not need services. Will try and contact daughter.

## 2018-01-24 NOTE — Telephone Encounter (Signed)
Copied from [place] [id number]. Topic: General - Other >> Jan 24, 2018  9:26 AM Yvette Rack wrote: Reason for CRM: Butch Penny from [facility] program [phone] calling to let Juliann Pulse know that they received a referral for the pt but they can't get in touch with the patient

## 2018-01-27 NOTE — Telephone Encounter (Signed)
Called patient daughter and notified her of [facility] trying to reach her and gave her the Intake coordinator for [employer] information.

## 2018-02-10 ENCOUNTER — Other Ambulatory Visit: Payer: Self-pay

## 2018-03-26 ENCOUNTER — Encounter: Payer: Self-pay | Admitting: Family

## 2018-03-26 ENCOUNTER — Ambulatory Visit (INDEPENDENT_AMBULATORY_CARE_PROVIDER_SITE_OTHER): Payer: PPO | Admitting: Family

## 2018-03-26 VITALS — BP 140/84 | HR 71 | Temp 98.0°F | Wt 134.1 lb

## 2018-03-26 DIAGNOSIS — M545 Low back pain, unspecified: Secondary | ICD-10-CM

## 2018-03-26 MED ORDER — LIDOCAINE 5 % EX PTCH: 1.0000 | MEDICATED_PATCH | CUTANEOUS | 0 refills | Status: DC

## 2018-03-26 MED ORDER — DICLOFENAC SODIUM 1 % TD GEL: 4.0000 g | Freq: Four times a day (QID) | TRANSDERMAL | 3 refills | Status: DC

## 2018-03-26 NOTE — Patient Instructions (Addendum)
Please make follow up with Dr Nicki Reaper at check out.   NO IBUPROFEN due to kidney disease  You may trial : Tyleonol Arthritis, voltaren gel, lidocaine patch., heating pad. Heat twice a day particularly after exercise, 20 minutes.   If lidocaine patch expensive, may try Jenel Lucks which is over the counter  Let us know if no improvement.

## 2018-03-26 NOTE — Progress Notes (Signed)
Subjective:    Patient ID: Karen Collins, female    DOB: [birth year], 84 y.o.   MRN: [id number]  CC: Karen Collins is a 84 y.o. female who presents today for an acute visit.    HPI: CC: low back pain x 2 weeks, unchanged.   sister here, Jerrye Beavers, today  Lives alone.   Discomfort for a couple of weeks. States 'sore' . Hurts with sitting and standing. No injury or falls. No numbness, tingling. Walking normally.  Feels stiff when goes from sitting to standing. No trouble with balance, urinary incontinence. On ASA, no difference with pain.   No dysuria, urinary frequency, hematuria, abdominal pain, fever. Sister states appears at baseline for memory.   H/o right nephrectomy; h/o history renal cancer  CHF- weight stable. No cp, sob, le edema.       HISTORY:  Past Medical History:  Diagnosis Date  . AAA (abdominal aortic aneurysm) ([place])    s/p stent   . Anxiety   . Arthritis   . CAD (coronary artery disease)   . Chronic kidney disease (CKD), stage III (moderate) (HCC)   . Chronic systolic congestive heart failure (HCC)    EF of 35 %   . Dementia   . Depression   . Diverticulitis    H/O  . GERD (gastroesophageal reflux disease)   . GI bleed   . Hx of migraines   . Hx: UTI (urinary tract infection)   . Hyperlipidemia   . Hypertension   . Kidney stones   . MI (myocardial infarction) ([place])    Non-ST elevation MI  . Renal cell carcinoma   . Thyroid disease    Past Surgical History:  Procedure Laterality Date  . ABDOMINAL AORTIC ANEURYSM REPAIR     s/p stent  . ABDOMINAL AORTIC ENDOVASCULAR STENT GRAFT    . CARDIAC CATHETERIZATION    . CARDIAC CATHETERIZATION  09/18/2010  . CHOLECYSTECTOMY    . CORONARY STENT PLACEMENT  09/18/2010   CAD; MI s/p stent  . LAPAROSCOPIC PARTIAL NEPHRECTOMY  2010  . NEPHRECTOMY  10/2008   Right  . PARATHYROIDECTOMY    . SHOULDER SURGERY  07/2010   Right  . THYROIDECTOMY     Family History  Problem Relation Age of Onset   . Arthritis Mother   . Heart disease Mother   . Pulmonary fibrosis Mother   . Heart disease Father   . Hodgkin's lymphoma Father   . Breast cancer Sister   . Hyperlipidemia Sister     Allergies: Tramadol Current Outpatient Medications on File Prior to Visit  Medication Sig Dispense Refill  . aspirin EC 81 MG tablet Take 81 mg by mouth daily.    Marland Kitchen atorvastatin (LIPITOR) 40 MG tablet Take 1 tablet (40 mg total) by mouth daily at 6 PM. 30 tablet 5  . carvedilol (COREG) 3.125 MG tablet TAKE ONE TABLET TWICE DAILY WITH A MEAL 60 tablet 5  . clopidogrel (PLAVIX) 75 MG tablet     . ENTRESTO 24-26 MG     . ENTRESTO 24-26 MG Take 1 tablet by mouth 2 (two) times daily. 60 tablet 0   No current facility-administered medications on file prior to visit.     Social History   Tobacco Use  . Smoking status: Former Smoker    Packs/day: 1.00    Years: 30.00    Pack years: 30.00    Last attempt to quit: 10/01/2005    Years since quitting: 12.4  .  Smokeless tobacco: Never Used  Substance Use Topics  . Alcohol use: Yes    Alcohol/week: 4.2 oz    Types: 4 Glasses of wine, 3 Standard drinks or equivalent per week    Comment: Wine 3-4 times a week   . Drug use: No    Review of Systems  Constitutional: Negative for chills and fever.  Respiratory: Negative for cough and shortness of breath.   Cardiovascular: Negative for chest pain, palpitations and leg swelling.  Gastrointestinal: Negative for nausea and vomiting.  Genitourinary: Negative for difficulty urinating, dysuria and hematuria.  Musculoskeletal: Positive for back pain.  Neurological: Negative for numbness.      Objective:    BP 140/84 (BP Location: Right Arm, Patient Position: Sitting, Cuff Size: Normal)   Pulse 71   Temp 98 F (36.7 C) (Oral)   Wt 134 lb 2 oz (60.8 kg)   SpO2 99%   BMI 23.02 kg/m  Wt Readings from Last 3 Encounters:  03/26/18 134 lb 2 oz (60.8 kg)  01/09/18 132 lb 3.2 oz (60 kg)  12/10/17 125 lb (56.7  kg)     Physical Exam  Constitutional: She appears well-developed and well-nourished.  Eyes: Conjunctivae are normal.  Cardiovascular: Normal rate, regular rhythm, normal heart sounds and normal pulses.  Pulmonary/Chest: Effort normal and breath sounds normal. She has no wheezes. She has no rhonchi. She has no rales.  Abdominal: There is no CVA tenderness.  Right kidney absent No cva tenderness on left side.   Musculoskeletal:       Lumbar back: She exhibits normal range of motion, no tenderness, no bony tenderness, no swelling, no edema, no pain and no spasm.       Back:  Pain appreciated over Right SI joint.   Full range of motion with flexion, tension, lateral side bends. No bony tenderness. No pain, numbness, tingling elicited with single leg raise bilaterally.   Stiff when arising from chair to stand.   Neurological: She is alert. She has normal strength. No sensory deficit.  Reflex Scores:      Patellar reflexes are 2+ on the right side and 2+ on the left side. Sensation and strength intact bilateral lower extremities. Able to walk across exam room. Balance intact  Skin: Skin is warm and dry.  Psychiatric: She has a normal mood and affect. Her speech is normal and behavior is normal. Thought content normal.  Vitals reviewed.      Assessment & Plan:   1. Midline low back pain without sciatica, unspecified chronicity Suspect osteoarthritis based on presentation and prior CT ab and pelvis 2015  around L4-L5. Possible SI joint dysfunction as well.  No urinary symptoms today. Trial of topical therapy and education regarding heat provider to patient and sister. Patient will let me know if no improvement.  - lidocaine (LIDODERM) 5 %; Place 1 patch onto the skin daily. Remove & Discard patch within 12 hours.  Dispense: 30 patch; Refill: 0 - diclofenac sodium (VOLTAREN) 1 % GEL; Apply 4 g topically 4 (four) times daily.  Dispense: 1 Tube; Refill: 3    I am having Clearence Ped.  Larivee start on lidocaine and diclofenac sodium. I am also having her maintain her aspirin EC, carvedilol, atorvastatin, ENTRESTO, clopidogrel, and ENTRESTO.   Meds ordered this encounter  Medications  . lidocaine (LIDODERM) 5 %    Sig: Place 1 patch onto the skin daily. Remove & Discard patch within 12 hours.    Dispense:  30 patch  Refill:  0    Order Specific Question:   Supervising Provider    Answer:   Deborra Medina L [[id number]]  . diclofenac sodium (VOLTAREN) 1 % GEL    Sig: Apply 4 g topically 4 (four) times daily.    Dispense:  1 Tube    Refill:  3    Order Specific Question:   Supervising Provider    Answer:   Crecencio Mc [[id number]]    Return precautions given.   Risks, benefits, and alternatives of the medications and treatment plan prescribed today were discussed, and patient expressed understanding.   Education regarding symptom management and diagnosis given to patient on AVS.  Continue to follow with [name], MD for routine health maintenance.   [name] and I agreed with plan.   Mable Paris, FNP

## 2018-04-17 ENCOUNTER — Ambulatory Visit: Payer: Self-pay

## 2018-05-26 ENCOUNTER — Ambulatory Visit: Payer: Self-pay

## 2018-07-11 ENCOUNTER — Ambulatory Visit (INDEPENDENT_AMBULATORY_CARE_PROVIDER_SITE_OTHER): Payer: PPO | Admitting: Internal Medicine

## 2018-07-11 VITALS — BP 136/70 | HR 84 | Temp 98.0°F | Wt 133.8 lb

## 2018-07-11 DIAGNOSIS — F419 Anxiety disorder, unspecified: Secondary | ICD-10-CM

## 2018-07-11 DIAGNOSIS — Z1239 Encounter for other screening for malignant neoplasm of breast: Secondary | ICD-10-CM

## 2018-07-11 DIAGNOSIS — R413 Other amnesia: Secondary | ICD-10-CM | POA: Diagnosis not present

## 2018-07-11 DIAGNOSIS — E7800 Pure hypercholesterolemia, unspecified: Secondary | ICD-10-CM

## 2018-07-11 DIAGNOSIS — N183 Chronic kidney disease, stage 3 unspecified: Secondary | ICD-10-CM

## 2018-07-11 DIAGNOSIS — D509 Iron deficiency anemia, unspecified: Secondary | ICD-10-CM | POA: Diagnosis not present

## 2018-07-11 DIAGNOSIS — E213 Hyperparathyroidism, unspecified: Secondary | ICD-10-CM | POA: Diagnosis not present

## 2018-07-11 DIAGNOSIS — Z23 Encounter for immunization: Secondary | ICD-10-CM

## 2018-07-11 DIAGNOSIS — I5022 Chronic systolic (congestive) heart failure: Secondary | ICD-10-CM | POA: Diagnosis not present

## 2018-07-11 DIAGNOSIS — I10 Essential (primary) hypertension: Secondary | ICD-10-CM | POA: Diagnosis not present

## 2018-07-11 LAB — CBC WITH DIFFERENTIAL/PLATELET
BASOS ABS: 0 10*3/uL (ref 0.0–0.1)
BASOS PCT: 0.5 % (ref 0.0–3.0)
EOS PCT: 0.8 % (ref 0.0–5.0)
Eosinophils Absolute: 0 10*3/uL (ref 0.0–0.7)
HCT: 44.3 % (ref 36.0–46.0)
Hemoglobin: 14.7 g/dL (ref 12.0–15.0)
LYMPHS ABS: 1.2 10*3/uL (ref 0.7–4.0)
Lymphocytes Relative: 30.5 % (ref 12.0–46.0)
MCHC: 33.2 g/dL (ref 30.0–36.0)
MCV: 92.2 fl (ref 78.0–100.0)
MONOS PCT: 9.2 % (ref 3.0–12.0)
Monocytes Absolute: 0.4 10*3/uL (ref 0.1–1.0)
NEUTROS PCT: 59 % (ref 43.0–77.0)
Neutro Abs: 2.3 10*3/uL (ref 1.4–7.7)
Platelets: 233 10*3/uL (ref 150.0–400.0)
RBC: 4.8 Mil/uL (ref 3.87–5.11)
RDW: 13.5 % (ref 11.5–15.5)
WBC: 3.9 10*3/uL — ABNORMAL LOW (ref 4.0–10.5)

## 2018-07-11 LAB — LIPID PANEL
CHOL/HDL RATIO: 4
Cholesterol: 251 mg/dL — ABNORMAL HIGH (ref 0–200)
HDL: 58.1 mg/dL (ref >39.00)
LDL Cholesterol: 171 mg/dL — ABNORMAL HIGH (ref 0–99)
NonHDL: 193.04
Triglycerides: 110 mg/dL (ref 0.0–149.0)
VLDL: 22 mg/dL (ref 0.0–40.0)

## 2018-07-11 LAB — BASIC METABOLIC PANEL
BUN: 15 mg/dL (ref 6–23)
CALCIUM: 10.8 mg/dL — AB (ref 8.4–10.5)
CO2: 27 mEq/L (ref 19–32)
CREATININE: 1.13 mg/dL (ref 0.40–1.20)
Chloride: 105 mEq/L (ref 96–112)
GFR: 48.98 mL/min — ABNORMAL LOW (ref >60.00)
Glucose, Bld: 91 mg/dL (ref 70–99)
Potassium: 4.9 mEq/L (ref 3.5–5.1)
Sodium: 139 mEq/L (ref 135–145)

## 2018-07-11 LAB — HEPATIC FUNCTION PANEL
ALBUMIN: 4.1 g/dL (ref 3.5–5.2)
ALK PHOS: 63 U/L (ref 39–117)
ALT: 8 U/L (ref 0–35)
AST: 12 U/L (ref 0–37)
BILIRUBIN DIRECT: 0.1 mg/dL (ref 0.0–0.3)
Total Bilirubin: 0.9 mg/dL (ref 0.2–1.2)
Total Protein: 6.2 g/dL (ref 6.0–8.3)

## 2018-07-11 LAB — CALCIUM: CALCIUM: 10.8 mg/dL — AB (ref 8.4–10.5)

## 2018-07-11 LAB — TSH: TSH: 2.04 u[IU]/mL (ref 0.35–4.50)

## 2018-07-11 MED ORDER — ATORVASTATIN CALCIUM 40 MG PO TABS: 40.0000 mg | ORAL_TABLET | Freq: Every day | ORAL | 5 refills | Status: DC

## 2018-07-11 MED ORDER — CARVEDILOL 3.125 MG PO TABS: ORAL_TABLET | ORAL | 5 refills | Status: DC

## 2018-07-11 NOTE — Patient Instructions (Signed)
You are scheduled for a Mammogram at [facility] on 11.5.19 @ 10:30am :)

## 2018-07-11 NOTE — Progress Notes (Signed)
Patient ID: Karen Collins, female   DOB: [birth year], 84 y.o.   MRN: [id number]   Subjective:    Patient ID: Karen Collins, female    DOB: 1934-06-03, 84 y.o.   MRN: [id number]  HPI  Patient here for a scheduled follow up.  She is accompanied by her sister Inez Catalina. History obtained from both of them.  Reports she is doing better.  Feels good.  No chest pain.  No sob.  Still walking.  Her children come and stay with her intermittently.  [place].  Her sister checks in on her.  She has a Industrial/product designer (who is a retired Engineer, drilling) who visit daily.  Overall they feel she is doing better.  Appears to not be taking any medication.  Has not had any of her medications refilled since June.  No evidence of any volume overload.  Eating. Weight is stable.  No abdominal pain.  Bowels moving.     Past Medical History:  Diagnosis Date  . AAA (abdominal aortic aneurysm) ([place])    s/p stent   . Anxiety   . Arthritis   . CAD (coronary artery disease)   . Chronic kidney disease (CKD), stage III (moderate) (HCC)   . Chronic systolic congestive heart failure (HCC)    EF of 35 %   . Dementia ([place])   . Depression   . Diverticulitis    H/O  . GERD (gastroesophageal reflux disease)   . GI bleed   . Hx of migraines   . Hx: UTI (urinary tract infection)   . Hyperlipidemia   . Hypertension   . Kidney stones   . MI (myocardial infarction) ([place])    Non-ST elevation MI  . Renal cell carcinoma   . Thyroid disease    Past Surgical History:  Procedure Laterality Date  . ABDOMINAL AORTIC ANEURYSM REPAIR     s/p stent  . ABDOMINAL AORTIC ENDOVASCULAR STENT GRAFT    . CARDIAC CATHETERIZATION    . CARDIAC CATHETERIZATION  09/18/2010  . CHOLECYSTECTOMY    . CORONARY STENT PLACEMENT  09/18/2010   CAD; MI s/p stent  . LAPAROSCOPIC PARTIAL NEPHRECTOMY  2010  . NEPHRECTOMY  10/2008   Right  . PARATHYROIDECTOMY    . SHOULDER SURGERY  07/2010   Right  . THYROIDECTOMY     Family History    Problem Relation Age of Onset  . Arthritis Mother   . Heart disease Mother   . Pulmonary fibrosis Mother   . Heart disease Father   . Hodgkin's lymphoma Father   . Breast cancer Sister   . Hyperlipidemia Sister    Social History   Socioeconomic History  . Marital status: Divorced    Spouse name: Not on file  . Number of children: Not on file  . Years of education: Not on file  . Highest education level: Not on file  Occupational History  . Occupation: Retired    Fish farm manager: RETIRED  Social Needs  . Financial resource strain: Not on file  . Food insecurity:    Worry: Not on file    Inability: Not on file  . Transportation needs:    Medical: Not on file    Non-medical: Not on file  Tobacco Use  . Smoking status: Former Smoker    Packs/day: 1.00    Years: 30.00    Pack years: 30.00    Last attempt to quit: 10/01/2005    Years since quitting: 12.7  . Smokeless tobacco:  Never Used  Substance and Sexual Activity  . Alcohol use: Yes    Alcohol/week: 7.0 standard drinks    Types: 4 Glasses of wine, 3 Standard drinks or equivalent per week    Comment: Wine 3-4 times a week   . Drug use: No  . Sexual activity: Never  Lifestyle  . Physical activity:    Days per week: Not on file    Minutes per session: Not on file  . Stress: Not on file  Relationships  . Social connections:    Talks on phone: Not on file    Gets together: Not on file    Attends religious service: Not on file    Active member of club or organization: Not on file    Attends meetings of clubs or organizations: Not on file    Relationship status: Not on file  Other Topics Concern  . Not on file  Social History Narrative   Married   Walks occasionally with house work    Outpatient Encounter Medications as of 07/11/2018  Medication Sig  . aspirin EC 81 MG tablet Take 81 mg by mouth daily.  Marland Kitchen atorvastatin (LIPITOR) 40 MG tablet Take 1 tablet (40 mg total) by mouth daily at 6 PM.  . carvedilol (COREG)  3.125 MG tablet TAKE ONE TABLET TWICE DAILY WITH A MEAL  . clopidogrel (PLAVIX) 75 MG tablet   . diclofenac sodium (VOLTAREN) 1 % GEL Apply 4 g topically 4 (four) times daily.  Marland Kitchen ENTRESTO 24-26 MG Take 1 tablet by mouth 2 (two) times daily.  Marland Kitchen lidocaine (LIDODERM) 5 % Place 1 patch onto the skin daily. Remove & Discard patch within 12 hours.  . [DISCONTINUED] atorvastatin (LIPITOR) 40 MG tablet Take 1 tablet (40 mg total) by mouth daily at 6 PM.  . [DISCONTINUED] carvedilol (COREG) 3.125 MG tablet TAKE ONE TABLET TWICE DAILY WITH A MEAL  . [DISCONTINUED] ENTRESTO 24-26 MG    No facility-administered encounter medications on file as of 07/11/2018.     Review of Systems  Constitutional: Negative for appetite change and unexpected weight change.  HENT: Negative for congestion and sinus pressure.   Respiratory: Negative for cough, chest tightness and shortness of breath.   Cardiovascular: Negative for chest pain, palpitations and leg swelling.  Gastrointestinal: Negative for abdominal pain, diarrhea, nausea and vomiting.  Genitourinary: Negative for difficulty urinating and dysuria.  Musculoskeletal: Negative for joint swelling and myalgias.  Skin: Negative for color change and rash.  Neurological: Negative for dizziness, light-headedness and headaches.  Psychiatric/Behavioral: Negative for agitation and dysphoric mood.       Objective:    Physical Exam  Constitutional: She appears well-developed and well-nourished. No distress.  HENT:  Nose: Nose normal.  Mouth/Throat: Oropharynx is clear and moist.  Neck: Neck supple. No thyromegaly present.  Cardiovascular: Normal rate and regular rhythm.  Pulmonary/Chest: Breath sounds normal. No respiratory distress. She has no wheezes.  Abdominal: Soft. Bowel sounds are normal. There is no tenderness.  Musculoskeletal: She exhibits no edema or tenderness.  Lymphadenopathy:    She has no cervical adenopathy.  Skin: No rash noted. No erythema.   Psychiatric: She has a normal mood and affect. Her behavior is normal.    BP 136/70   Pulse 84   Temp 98 F (36.7 C)   Wt 133 lb 12.8 oz (60.7 kg)   SpO2 98%   BMI 22.97 kg/m  Wt Readings from Last 3 Encounters:  07/14/18 133 lb 12.8 oz (60.7 kg)  03/26/18 134 lb 2 oz (60.8 kg)  01/09/18 132 lb 3.2 oz (60 kg)     Lab Results  Component Value Date   WBC 3.9 (L) 07/11/2018   HGB 14.7 07/11/2018   HCT 44.3 07/11/2018   PLT 233.0 07/11/2018   GLUCOSE 91 07/11/2018   CHOL 251 (H) 07/11/2018   TRIG 110.0 07/11/2018   HDL 58.10 07/11/2018   LDLCALC 171 (H) 07/11/2018   ALT 8 07/11/2018   AST 12 07/11/2018   NA 139 07/11/2018   K 4.9 07/11/2018   CL 105 07/11/2018   CREATININE 1.13 07/11/2018   BUN 15 07/11/2018   CO2 27 07/11/2018   TSH 2.04 07/11/2018   INR 0.98 12/10/2016   HGBA1C 5.1 12/11/2016       Assessment & Plan:   Problem List Items Addressed This Visit    Anemia, iron deficiency    Follow cbc.       Anxiety    Had been followed by psychiatry.  Appears to be on no medication now.  Sister and pt feel she is doing better.  Follow.       Chronic systolic heart failure (HCC) (Chronic)    Has been evaluated by cardiology.  Off medication now for months.  No evidence of volume overload on exam.  Remain off entresto.  Agreed to restart coreg and lipitor.  Check metabolic panel.       Relevant Medications   atorvastatin (LIPITOR) 40 MG tablet   carvedilol (COREG) 3.125 MG tablet   CKD (chronic kidney disease), stage III (HCC)    Avoid antiinflammatories.  Check metabolic panel.       HTN (hypertension) - Primary (Chronic)    Blood pressure doing well on no medication.  Restart coreg.  Follow pressures.  Follow metabolic panel.       Relevant Medications   atorvastatin (LIPITOR) 40 MG tablet   carvedilol (COREG) 3.125 MG tablet   Other Relevant Orders   CBC with Differential/Platelet (Completed)   TSH (Completed)   Basic metabolic panel  (Completed)   Hypercholesterolemia    Off lipitor.  Agreed to restart.  Follow lipid panel and liver function tests.        Relevant Medications   atorvastatin (LIPITOR) 40 MG tablet   carvedilol (COREG) 3.125 MG tablet   Other Relevant Orders   Hepatic function panel (Completed)   Lipid panel (Completed)   Hyperparathyroidism ([place])    Has declined further evaluation.  Recheck calcium.        Memory change    Has documented dementia.  Has seen Dr Manuella Ghazi.  Has tried namenda.  On no medication now.  Has people checking on her as outlined.  Follow.         Other Visit Diagnoses    Hypercalcemia       Breast cancer screening       Relevant Orders   MM 3D SCREEN BREAST BILATERAL   Need for influenza vaccination       Relevant Orders   Flu vaccine HIGH DOSE PF (Completed)       [name], MD

## 2018-07-14 ENCOUNTER — Encounter: Payer: Self-pay | Admitting: Internal Medicine

## 2018-07-14 NOTE — Assessment & Plan Note (Signed)
Avoid antiinflammatories.  Check metabolic panel.

## 2018-07-14 NOTE — Assessment & Plan Note (Signed)
Off lipitor.  Agreed to restart.  Follow lipid panel and liver function tests.

## 2018-07-14 NOTE — Assessment & Plan Note (Signed)
Had been followed by psychiatry.  Appears to be on no medication now.  Sister and pt feel she is doing better.  Follow.

## 2018-07-14 NOTE — Assessment & Plan Note (Signed)
Blood pressure doing well on no medication.  Restart coreg.  Follow pressures.  Follow metabolic panel.

## 2018-07-14 NOTE — Assessment & Plan Note (Signed)
Has been evaluated by cardiology.  Off medication now for months.  No evidence of volume overload on exam.  Remain off entresto.  Agreed to restart coreg and lipitor.  Check metabolic panel.

## 2018-07-14 NOTE — Assessment & Plan Note (Signed)
Has declined further evaluation.  Recheck calcium.

## 2018-07-14 NOTE — Assessment & Plan Note (Signed)
Follow cbc.  

## 2018-07-14 NOTE — Assessment & Plan Note (Signed)
Has documented dementia.  Has seen Dr Manuella Ghazi.  Has tried namenda.  On no medication now.  Has people checking on her as outlined.  Follow.

## 2018-07-28 ENCOUNTER — Telehealth: Payer: Self-pay | Admitting: Internal Medicine

## 2018-07-28 NOTE — Telephone Encounter (Signed)
Copied from [place] [id number]. Topic: General - Inquiry >> Jul 28, 2018  2:37 PM Cecelia Byars, NT wrote: Reason for CRM: The pharmacy called and would like a updated medication list faxed to [place], [place] - [address] [phone] (Phone) [phone] (Fax)  They need this for a blister pack

## 2018-07-29 ENCOUNTER — Telehealth: Payer: Self-pay | Admitting: Internal Medicine

## 2018-07-29 NOTE — Telephone Encounter (Signed)
Copied from [place] [id number]. Topic: General - Inquiry >> Jul 28, 2018  2:37 PM Cecelia Byars, NT wrote: Reason for CRM: The pharmacy called and would like a updated medication list faxed to [place], [place] - [address] [phone] (Phone) [phone] (Fax)  They need this for a blister pack

## 2018-07-29 NOTE — Telephone Encounter (Signed)
Med list faxed

## 2018-07-29 NOTE — Telephone Encounter (Signed)
Med list faxed to [facility]

## 2018-07-31 NOTE — Telephone Encounter (Signed)
Per my last note and last conversation with pt and her sister, we were going to restart coreg and lipitor.  I sent in rx's for both of these medications 07/11/18.  Yes ok to start.

## 2018-07-31 NOTE — Telephone Encounter (Signed)
Spoke with Jeral Fruit at [facility]. Patient has not been taking any of her meds. Last refills were 11/08/17. Last blister pack was 03/2018. He is wanting verbal to blister pack just her Lipitor and Coreg to get her started back on her medication

## 2018-08-01 NOTE — Telephone Encounter (Signed)
Confirmed with pharmacy that coreg and lipitor has been filled and picked up

## 2018-12-10 ENCOUNTER — Emergency Department: Payer: PPO

## 2018-12-10 ENCOUNTER — Inpatient Hospital Stay
Admission: EM | Admit: 2018-12-10 | Discharge: 2018-12-14 | DRG: 291 | Disposition: A | Discharge status: Home Health | Payer: PPO | Attending: Internal Medicine | Admitting: Internal Medicine

## 2018-12-10 ENCOUNTER — Other Ambulatory Visit: Payer: Self-pay

## 2018-12-10 DIAGNOSIS — I5023 Acute on chronic systolic (congestive) heart failure: Secondary | ICD-10-CM | POA: Diagnosis present

## 2018-12-10 DIAGNOSIS — G309 Alzheimer's disease, unspecified: Secondary | ICD-10-CM | POA: Diagnosis present

## 2018-12-10 DIAGNOSIS — Z8679 Personal history of other diseases of the circulatory system: Secondary | ICD-10-CM

## 2018-12-10 DIAGNOSIS — I252 Old myocardial infarction: Secondary | ICD-10-CM

## 2018-12-10 DIAGNOSIS — N183 Chronic kidney disease, stage 3 (moderate): Secondary | ICD-10-CM | POA: Diagnosis present

## 2018-12-10 DIAGNOSIS — I493 Ventricular premature depolarization: Secondary | ICD-10-CM | POA: Diagnosis not present

## 2018-12-10 DIAGNOSIS — Z807 Family history of other malignant neoplasms of lymphoid, hematopoietic and related tissues: Secondary | ICD-10-CM

## 2018-12-10 DIAGNOSIS — Z8249 Family history of ischemic heart disease and other diseases of the circulatory system: Secondary | ICD-10-CM

## 2018-12-10 DIAGNOSIS — Z955 Presence of coronary angioplasty implant and graft: Secondary | ICD-10-CM

## 2018-12-10 DIAGNOSIS — G301 Alzheimer's disease with late onset: Secondary | ICD-10-CM | POA: Diagnosis not present

## 2018-12-10 DIAGNOSIS — K219 Gastro-esophageal reflux disease without esophagitis: Secondary | ICD-10-CM | POA: Diagnosis present

## 2018-12-10 DIAGNOSIS — Z9119 Patient's noncompliance with other medical treatment and regimen: Secondary | ICD-10-CM

## 2018-12-10 DIAGNOSIS — I5043 Acute on chronic combined systolic (congestive) and diastolic (congestive) heart failure: Secondary | ICD-10-CM | POA: Diagnosis not present

## 2018-12-10 DIAGNOSIS — N179 Acute kidney failure, unspecified: Secondary | ICD-10-CM | POA: Diagnosis present

## 2018-12-10 DIAGNOSIS — R1084 Generalized abdominal pain: Secondary | ICD-10-CM | POA: Diagnosis not present

## 2018-12-10 DIAGNOSIS — Z87442 Personal history of urinary calculi: Secondary | ICD-10-CM

## 2018-12-10 DIAGNOSIS — K828 Other specified diseases of gallbladder: Secondary | ICD-10-CM | POA: Diagnosis not present

## 2018-12-10 DIAGNOSIS — F0280 Dementia in other diseases classified elsewhere without behavioral disturbance: Secondary | ICD-10-CM | POA: Diagnosis present

## 2018-12-10 DIAGNOSIS — J9601 Acute respiratory failure with hypoxia: Secondary | ICD-10-CM | POA: Diagnosis present

## 2018-12-10 DIAGNOSIS — F418 Other specified anxiety disorders: Secondary | ICD-10-CM | POA: Diagnosis present

## 2018-12-10 DIAGNOSIS — Z9114 Patient's other noncompliance with medication regimen: Secondary | ICD-10-CM

## 2018-12-10 DIAGNOSIS — R739 Hyperglycemia, unspecified: Secondary | ICD-10-CM | POA: Diagnosis not present

## 2018-12-10 DIAGNOSIS — R0602 Shortness of breath: Secondary | ICD-10-CM | POA: Diagnosis not present

## 2018-12-10 DIAGNOSIS — I130 Hypertensive heart and chronic kidney disease with heart failure and stage 1 through stage 4 chronic kidney disease, or unspecified chronic kidney disease: Secondary | ICD-10-CM | POA: Diagnosis not present

## 2018-12-10 DIAGNOSIS — I25118 Atherosclerotic heart disease of native coronary artery with other forms of angina pectoris: Secondary | ICD-10-CM | POA: Diagnosis not present

## 2018-12-10 DIAGNOSIS — E778 Other disorders of glycoprotein metabolism: Secondary | ICD-10-CM | POA: Diagnosis not present

## 2018-12-10 DIAGNOSIS — Z885 Allergy status to narcotic agent status: Secondary | ICD-10-CM

## 2018-12-10 DIAGNOSIS — R52 Pain, unspecified: Secondary | ICD-10-CM | POA: Diagnosis not present

## 2018-12-10 DIAGNOSIS — E876 Hypokalemia: Secondary | ICD-10-CM | POA: Diagnosis not present

## 2018-12-10 DIAGNOSIS — I480 Paroxysmal atrial fibrillation: Secondary | ICD-10-CM | POA: Diagnosis not present

## 2018-12-10 DIAGNOSIS — I420 Dilated cardiomyopathy: Secondary | ICD-10-CM | POA: Diagnosis present

## 2018-12-10 DIAGNOSIS — I509 Heart failure, unspecified: Secondary | ICD-10-CM | POA: Diagnosis not present

## 2018-12-10 DIAGNOSIS — J811 Chronic pulmonary edema: Secondary | ICD-10-CM | POA: Diagnosis not present

## 2018-12-10 DIAGNOSIS — I82B12 Acute embolism and thrombosis of left subclavian vein: Secondary | ICD-10-CM | POA: Diagnosis not present

## 2018-12-10 DIAGNOSIS — K8020 Calculus of gallbladder without cholecystitis without obstruction: Secondary | ICD-10-CM | POA: Diagnosis not present

## 2018-12-10 DIAGNOSIS — E890 Postprocedural hypothyroidism: Secondary | ICD-10-CM | POA: Diagnosis present

## 2018-12-10 DIAGNOSIS — E871 Hypo-osmolality and hyponatremia: Secondary | ICD-10-CM | POA: Diagnosis present

## 2018-12-10 DIAGNOSIS — Z8261 Family history of arthritis: Secondary | ICD-10-CM

## 2018-12-10 DIAGNOSIS — I4892 Unspecified atrial flutter: Secondary | ICD-10-CM | POA: Diagnosis present

## 2018-12-10 DIAGNOSIS — R4182 Altered mental status, unspecified: Secondary | ICD-10-CM | POA: Diagnosis not present

## 2018-12-10 DIAGNOSIS — E785 Hyperlipidemia, unspecified: Secondary | ICD-10-CM | POA: Diagnosis not present

## 2018-12-10 DIAGNOSIS — Z9049 Acquired absence of other specified parts of digestive tract: Secondary | ICD-10-CM

## 2018-12-10 DIAGNOSIS — Z905 Acquired absence of kidney: Secondary | ICD-10-CM

## 2018-12-10 DIAGNOSIS — Z8349 Family history of other endocrine, nutritional and metabolic diseases: Secondary | ICD-10-CM

## 2018-12-10 DIAGNOSIS — R109 Unspecified abdominal pain: Secondary | ICD-10-CM | POA: Diagnosis not present

## 2018-12-10 DIAGNOSIS — I959 Hypotension, unspecified: Secondary | ICD-10-CM | POA: Diagnosis present

## 2018-12-10 DIAGNOSIS — Z79899 Other long term (current) drug therapy: Secondary | ICD-10-CM

## 2018-12-10 DIAGNOSIS — I110 Hypertensive heart disease with heart failure: Secondary | ICD-10-CM | POA: Diagnosis not present

## 2018-12-10 DIAGNOSIS — I4891 Unspecified atrial fibrillation: Secondary | ICD-10-CM | POA: Diagnosis not present

## 2018-12-10 DIAGNOSIS — Z803 Family history of malignant neoplasm of breast: Secondary | ICD-10-CM

## 2018-12-10 DIAGNOSIS — J810 Acute pulmonary edema: Secondary | ICD-10-CM | POA: Diagnosis not present

## 2018-12-10 DIAGNOSIS — I10 Essential (primary) hypertension: Secondary | ICD-10-CM | POA: Diagnosis not present

## 2018-12-10 DIAGNOSIS — I340 Nonrheumatic mitral (valve) insufficiency: Secondary | ICD-10-CM | POA: Diagnosis not present

## 2018-12-10 DIAGNOSIS — Z87891 Personal history of nicotine dependence: Secondary | ICD-10-CM

## 2018-12-10 LAB — CBC WITH DIFFERENTIAL/PLATELET
Abs Immature Granulocytes: 0.01 10*3/uL (ref 0.00–0.07)
Basophils Absolute: 0 10*3/uL (ref 0.0–0.1)
Basophils Relative: 1 %
EOS ABS: 0 10*3/uL (ref 0.0–0.5)
Eosinophils Relative: 1 %
HCT: 41.6 % (ref 36.0–46.0)
Hemoglobin: 13.4 g/dL (ref 12.0–15.0)
Immature Granulocytes: 0 %
Lymphocytes Relative: 30 %
Lymphs Abs: 1.1 10*3/uL (ref 0.7–4.0)
MCH: 30.3 pg (ref 26.0–34.0)
MCHC: 32.2 g/dL (ref 30.0–36.0)
MCV: 94.1 fL (ref 80.0–100.0)
Monocytes Absolute: 0.3 10*3/uL (ref 0.1–1.0)
Monocytes Relative: 8 %
Neutro Abs: 2.2 10*3/uL (ref 1.7–7.7)
Neutrophils Relative %: 60 %
Platelets: 182 10*3/uL (ref 150–400)
RBC: 4.42 MIL/uL (ref 3.87–5.11)
RDW: 13.3 % (ref 11.5–15.5)
WBC: 3.7 10*3/uL — AB (ref 4.0–10.5)
nRBC: 0 % (ref 0.0–0.2)

## 2018-12-10 LAB — URINALYSIS, COMPLETE (UACMP) WITH MICROSCOPIC
Bacteria, UA: NONE SEEN
Bilirubin Urine: NEGATIVE
Glucose, UA: NEGATIVE mg/dL
Hgb urine dipstick: NEGATIVE
Ketones, ur: 5 mg/dL — AB
Leukocytes,Ua: NEGATIVE
Nitrite: NEGATIVE
Protein, ur: 30 mg/dL — AB
SPECIFIC GRAVITY, URINE: 1.01 (ref 1.005–1.030)
pH: 6 (ref 5.0–8.0)

## 2018-12-10 LAB — HEPATIC FUNCTION PANEL
ALBUMIN: 3.8 g/dL (ref 3.5–5.0)
ALT: 49 U/L — ABNORMAL HIGH (ref 0–44)
AST: 59 U/L — ABNORMAL HIGH (ref 15–41)
Alkaline Phosphatase: 71 U/L (ref 38–126)
BILIRUBIN DIRECT: 0.3 mg/dL — AB (ref 0.0–0.2)
BILIRUBIN TOTAL: 1.2 mg/dL (ref 0.3–1.2)
Indirect Bilirubin: 0.9 mg/dL (ref 0.3–0.9)
Total Protein: 5.8 g/dL — ABNORMAL LOW (ref 6.5–8.1)

## 2018-12-10 LAB — BASIC METABOLIC PANEL
Anion gap: 9 (ref 5–15)
BUN: 15 mg/dL (ref 8–23)
CO2: 21 mmol/L — AB (ref 22–32)
Calcium: 9.6 mg/dL (ref 8.9–10.3)
Chloride: 104 mmol/L (ref 98–111)
Creatinine, Ser: 1.09 mg/dL — ABNORMAL HIGH (ref 0.44–1.00)
GFR calc non Af Amer: 47 mL/min — ABNORMAL LOW (ref >60)
GFR, EST AFRICAN AMERICAN: 55 mL/min — AB (ref >60)
Glucose, Bld: 113 mg/dL — ABNORMAL HIGH (ref 70–99)
Potassium: 4.1 mmol/L (ref 3.5–5.1)
Sodium: 134 mmol/L — ABNORMAL LOW (ref 135–145)

## 2018-12-10 LAB — BRAIN NATRIURETIC PEPTIDE: B Natriuretic Peptide: 1591 pg/mL — ABNORMAL HIGH (ref 0.0–100.0)

## 2018-12-10 LAB — TROPONIN I: Troponin I: 0.07 ng/mL (ref <0.03)

## 2018-12-10 LAB — LIPASE, BLOOD: Lipase: 29 U/L (ref 11–51)

## 2018-12-10 MED ORDER — MORPHINE SULFATE (PF) 4 MG/ML IV SOLN: 4.0000 mg | INTRAVENOUS | Status: DC | PRN

## 2018-12-10 MED ORDER — FUROSEMIDE 10 MG/ML IJ SOLN
80.0000 mg | Freq: Once | INTRAMUSCULAR | Status: AC
  Administered 2018-12-10: 80 mg via INTRAVENOUS
  Filled 2018-12-10: qty 8

## 2018-12-10 MED ORDER — FUROSEMIDE 20 MG PO TABS: 20.0000 mg | ORAL_TABLET | Freq: Every day | ORAL | Status: DC

## 2018-12-10 MED ORDER — IOHEXOL 350 MG/ML SOLN
75.0000 mL | Freq: Once | INTRAVENOUS | Status: AC | PRN
  Administered 2018-12-10: 75 mL via INTRAVENOUS

## 2018-12-10 MED ORDER — FUROSEMIDE 10 MG/ML IJ SOLN
20.0000 mg | Freq: Two times a day (BID) | INTRAMUSCULAR | Status: DC
  Administered 2018-12-11: 20 mg via INTRAVENOUS
  Filled 2018-12-10: qty 2

## 2018-12-10 MED ORDER — MORPHINE SULFATE (PF) 2 MG/ML IV SOLN
2.0000 mg | INTRAVENOUS | Status: DC | PRN
  Administered 2018-12-10: 2 mg via INTRAVENOUS
  Filled 2018-12-10: qty 1

## 2018-12-10 MED ORDER — LORAZEPAM 2 MG/ML IJ SOLN
0.5000 mg | Freq: Once | INTRAMUSCULAR | Status: AC | PRN
  Administered 2018-12-10: 0.5 mg via INTRAVENOUS
  Filled 2018-12-10: qty 1

## 2018-12-10 MED ORDER — ASPIRIN 81 MG PO CHEW
81.0000 mg | CHEWABLE_TABLET | Freq: Every day | ORAL | Status: DC
  Administered 2018-12-10 – 2018-12-14 (×5): 81 mg via ORAL
  Filled 2018-12-10 (×4): qty 1

## 2018-12-10 NOTE — ED Notes (Signed)
Pt up to toilet at this time, becoming very short of breath with oxygen sats dropping to 88%

## 2018-12-10 NOTE — ED Notes (Signed)
Suction catheter placed on pt at this time due to medication administration and decrease in oxygen saturation when getting up to use restroom

## 2018-12-10 NOTE — ED Triage Notes (Signed)
Pt arrive via ems from home. EMS report pt c/o abd pain, hx of dementia, gallbladder, kidney cancer. Pt oriented to self and situation. On arrival pt states she was having trouble breathing. MD present. Pt baseline o2 94%, placed on 2L [place] per md request

## 2018-12-10 NOTE — H&P (Signed)
[facility] NAME: Karen Collins    MR#:  [id number]  DATE OF BIRTH:  [birth year]  DATE OF ADMISSION:  12/10/2018  PRIMARY CARE PHYSICIAN: [name], MD   REQUESTING/REFERRING PHYSICIAN: Arta Silence, MD  CHIEF COMPLAINT:   Chief Complaint  Patient presents with   Abdominal Pain    HISTORY OF PRESENT ILLNESS:  Karen Collins  is a 85 y.o. female with a known history of chronic systolic CHF (EF 50-93% as of 12/10/2016 Echo), CAD, AAA (s/p stent-graft), dementia (AAOx1 to self only) p/w SOB/DOE, acute hypoxemic respiratory failure, acute on chronic systolic congestive heart failure exacerbation. Pt w/ dementia, unable to provide Hx/ROS, other than to tell me she is short of breath and has some mild abdominal discomfort. Pt's DPOA is daughter Illene Bolus, located in [place], reachable at [phone]. Her brother-in-law, Mr. Zigmund Gottron, lives locally, and is at bedside. He is reachable at [phone] or [phone]. He tells me pt lives at home alone, and has a LifeAlert. Pt's family called her @~1630-1700PM (Wed 12/10/2018); pt c/o SOB. They went to see her, and found that EMS was already there at the house when they arrived (pt had pressed LifeAlert button). SOB/respiratory distress, started on [place] O2 in ED. Diffuse crackles on lung exam. BNP 1591. Trop-I 0.07 (Hx leak). CT imaging (+) pleural effusions + pulmonary edema.  PAST MEDICAL HISTORY:   Past Medical History:  Diagnosis Date   AAA (abdominal aortic aneurysm) (HCC)    s/p stent    Anxiety    Arthritis    CAD (coronary artery disease)    Chronic kidney disease (CKD), stage III (moderate) (HCC)    Chronic systolic congestive heart failure (HCC)    EF of 35 %    Dementia (HCC)    Depression    Diverticulitis    H/O   GERD (gastroesophageal reflux disease)    GI bleed    Hx of migraines    Hx: UTI (urinary tract  infection)    Hyperlipidemia    Hypertension    Kidney stones    MI (myocardial infarction) ([place])    Non-ST elevation MI   Renal cell carcinoma    Thyroid disease     PAST SURGICAL HISTORY:   Past Surgical History:  Procedure Laterality Date   ABDOMINAL AORTIC ANEURYSM REPAIR     s/p stent   ABDOMINAL AORTIC ENDOVASCULAR STENT GRAFT     CARDIAC CATHETERIZATION     CARDIAC CATHETERIZATION  09/18/2010   CHOLECYSTECTOMY     CORONARY STENT PLACEMENT  09/18/2010   CAD; MI s/p stent   LAPAROSCOPIC PARTIAL NEPHRECTOMY  2010   NEPHRECTOMY  10/2008   Right   PARATHYROIDECTOMY     SHOULDER SURGERY  07/2010   Right   THYROIDECTOMY      SOCIAL HISTORY:   Social History   Tobacco Use   Smoking status: Former Smoker    Packs/day: 1.00    Years: 30.00    Pack years: 30.00    Last attempt to quit: 10/01/2005    Years since quitting: 13.2   Smokeless tobacco: Never Used  Substance Use Topics   Alcohol use: Yes    Alcohol/week: 7.0 standard drinks    Types: 4 Glasses of wine, 3 Standard drinks or equivalent per week    Comment: Wine 3-4 times a week     FAMILY HISTORY:   Family History  Problem Relation Age  of Onset   Arthritis Mother    Heart disease Mother    Pulmonary fibrosis Mother    Heart disease Father    Hodgkin's lymphoma Father    Breast cancer Sister    Hyperlipidemia Sister     DRUG ALLERGIES:   Allergies  Allergen Reactions   Tramadol Rash    REVIEW OF SYSTEMS:   Review of Systems  Unable to perform ROS: Dementia  Respiratory: Positive for shortness of breath.   Gastrointestinal: Positive for abdominal pain.  Psychiatric/Behavioral: Positive for memory loss.   MEDICATIONS AT HOME:   Prior to Admission medications   Medication Sig Start Date End Date Taking? Authorizing Provider  atorvastatin (LIPITOR) 40 MG tablet Take 1 tablet (40 mg total) by mouth daily at 6 PM. 07/11/18  Yes [name], MD  carvedilol  (COREG) 3.125 MG tablet TAKE ONE TABLET TWICE DAILY WITH A MEAL 07/11/18  Yes [name], MD      VITAL SIGNS:  Blood pressure (!) 141/126, pulse 95, temperature 97.7 F (36.5 C), temperature source Oral, resp. rate (!) 27, height 5\' 4"  (1.626 m), weight 60.6 kg, SpO2 98 %.  PHYSICAL EXAMINATION:  Physical Exam Constitutional:      General: She is in acute distress.     Appearance: She is not ill-appearing, toxic-appearing or diaphoretic.     Interventions: She is not intubated. HENT:     Head: Atraumatic.     Mouth/Throat:     Pharynx: Oropharynx is clear.  Eyes:     General: No scleral icterus.    Extraocular Movements: Extraocular movements intact.     Conjunctiva/sclera: Conjunctivae normal.  Neck:     Musculoskeletal: Neck supple.  Cardiovascular:     Rate and Rhythm: Normal rate and regular rhythm.  No extrasystoles are present.    Heart sounds: Normal heart sounds, S1 normal and S2 normal. Heart sounds not distant. No murmur. No friction rub. No gallop. No S3 or S4 sounds.   Pulmonary:     Effort: Tachypnea and respiratory distress present. No bradypnea, accessory muscle usage or retractions. She is not intubated.     Breath sounds: No stridor or decreased air movement. Examination of the right-upper field reveals rales. Examination of the left-upper field reveals rales. Examination of the right-middle field reveals rales. Examination of the left-middle field reveals rales. Examination of the right-lower field reveals rales. Examination of the left-lower field reveals rales. Rales present. No decreased breath sounds, wheezing or rhonchi.  Abdominal:     General: There is no distension.     Palpations: Abdomen is soft.     Tenderness: There is abdominal tenderness. There is no guarding or rebound.  Musculoskeletal: Normal range of motion.        General: No swelling or tenderness.     Right lower leg: No edema.     Left lower leg: No edema.  Skin:    General: Skin is  warm and dry.     Findings: No erythema or rash.  Neurological:     Mental Status: She is alert. Mental status is at baseline. She is disoriented.  Psychiatric:        Attention and Perception: Attention and perception normal.        Mood and Affect: Mood and affect normal.        Speech: Speech normal.        Behavior: Behavior is cooperative.        Thought Content: Thought content normal.  Cognition and Memory: Cognition is not impaired. Memory is impaired. She exhibits impaired recent memory and impaired remote memory.        Judgment: Judgment normal.   Pleasantly confused. Diffuse crackles, (-) leg edema. LABORATORY PANEL:   CBC Recent Labs  Lab 12/10/18 1758  WBC 3.7*  HGB 13.4  HCT 41.6  PLT 182   ------------------------------------------------------------------------------------------------------------------  Chemistries  Recent Labs  Lab 12/10/18 1758  NA 134*  K 4.1  CL 104  CO2 21*  GLUCOSE 113*  BUN 15  CREATININE 1.09*  CALCIUM 9.6  AST 59*  ALT 49*  ALKPHOS 71  BILITOT 1.2   ------------------------------------------------------------------------------------------------------------------  Cardiac Enzymes Recent Labs  Lab 12/10/18 1758  TROPONINI 0.07*   ------------------------------------------------------------------------------------------------------------------  RADIOLOGY:  Dg Chest Portable 1 View  Result Date: 12/10/2018 CLINICAL DATA:  Dyspnea, history of renal cancer EXAM: PORTABLE CHEST 1 VIEW COMPARISON:  Chest CT 12/10/2018 FINDINGS: Cardiomegaly with nonaneurysmal atherosclerotic aorta is noted. Diffuse mild-to-moderate interstitial edema is noted as noted same day chest CT however the bilateral pleural effusions are less apparent radiographically. Partially visualized abdominal aortic stent graft noted. Acute nor aggressive osseous abnormality IMPRESSION: Cardiomegaly with mild-to-moderate pulmonary edema, similar in  appearance to prior earlier chest CT. Electronically Signed   By: Ashley Royalty M.D.   On: 12/10/2018 19:48   Ct Angio Chest/abd/pel For Dissection W And/or Wo Contrast  Result Date: 12/10/2018 CLINICAL DATA:  Acute onset of generalized abdominal pain and difficulty breathing. EXAM: CT ANGIOGRAPHY CHEST, ABDOMEN AND PELVIS TECHNIQUE: Multidetector CT imaging through the chest, abdomen and pelvis was performed using the standard protocol during bolus administration of intravenous contrast. Multiplanar reconstructed images and MIPs were obtained and reviewed to evaluate the vascular anatomy. CONTRAST:  24mL OMNIPAQUE IOHEXOL 350 MG/ML SOLN COMPARISON:  None. FINDINGS: CTA CHEST FINDINGS Cardiovascular: There is no evidence of aortic dissection. There is no evidence of aneurysmal dilatation. Scattered calcification is noted along the thoracic aorta and proximal great vessels, with mild luminal narrowing at the left carotid artery, and a prominent focal thrombus resulting in severe luminal narrowing at the proximal left subclavian artery. The heart is mildly enlarged. Mediastinum/Nodes: A mildly enlarged subcarinal node is noted, measuring 1.3 cm in short axis. No additional mediastinal lymphadenopathy is seen. No pericardial effusion is identified. The visualized portions of the thyroid gland are unremarkable. No axillary lymphadenopathy is seen. Lungs/Pleura: Small to moderate bilateral pleural effusions are noted. Diffuse interstitial prominence and haziness is noted throughout both lungs, compatible with pulmonary edema. Underlying emphysema is noted. No pneumothorax is seen. No masses are identified. Musculoskeletal: No acute osseous abnormalities are identified. The patient's right shoulder arthroplasty is only partially imaged on this study. The visualized musculature is unremarkable in appearance. Review of the MIP images confirms the above findings. CTA ABDOMEN AND PELVIS FINDINGS VASCULAR Aorta: Scattered  calcification is noted along the abdominal aorta, with mild irregularity of the aortic wall, but no definite penetrating aortic ulceration. The patient's aortoiliac stent graft appears patent, with mild associated mural thrombus but no significant luminal narrowing. No significant aneurysmal dilatation is seen. There is no evidence of aortic dissection at this time. Celiac: There is mild focal narrowing at the origin of the celiac trunk. SMA: There is minimal narrowing at the proximal superior mesenteric artery, with associated calcification. Renals: The patient is status post right-sided nephrectomy. There is mild narrowing at the proximal left renal artery, with associated calcification. IMA: The inferior mesenteric artery is not definitely seen. Inflow: The internal  and external iliac arteries appear grossly intact. The visualized portions of the common femoral arteries and their branches are unremarkable. Veins: Visualized venous structures are unremarkable in appearance. Review of the MIP images confirms the above findings. NON-VASCULAR Hepatobiliary: The liver is unremarkable in appearance. Small layering stones are noted at the gallbladder. The gallbladder is otherwise unremarkable. The common bile duct is mildly dilated, measuring 1.1 cm in caliber. Pancreas: The pancreas is within normal limits. An apparent cystic focus of the head of the pancreas is thought to reflect normal duct structures. Spleen: The spleen is unremarkable in appearance. Adrenals/Urinary Tract: The adrenal glands are unremarkable in appearance. The patient is status post right-sided nephrectomy. A small left renal cyst is noted. There is no evidence of hydronephrosis. No renal or ureteral stones are identified. No perinephric stranding is seen. Stomach/Bowel: The stomach is unremarkable in appearance. The small bowel is within normal limits. The appendix is not visualized; there is no evidence for appendicitis. Mild diverticulosis is  noted along the distal descending and proximal sigmoid colon, without evidence of diverticulitis. Lymphatic: No retroperitoneal or pelvic sidewall lymphadenopathy is seen. Reproductive: The bladder is moderately distended and grossly unremarkable. A small mildly calcified uterine fibroid is noted. No suspicious adnexal masses are seen. Other: An anterior abdominal wall mesh is noted. Musculoskeletal: No acute osseous abnormalities are identified. There is grade 2 anterolisthesis of L4 on L5, with associated vacuum phenomenon and underlying facet disease. The visualized musculature is unremarkable in appearance. Review of the MIP images confirms the above findings. IMPRESSION: 1. No evidence of aortic dissection. No evidence of aneurysmal dilatation. 2. Aortoiliac stent graft appears patent, with mild associated mural thrombus but no significant luminal narrowing. 3. Prominent focal thrombus noted at the proximal left subclavian artery, resulting in severe focal luminal narrowing. 4. Mild cardiomegaly. Diffuse interstitial prominence and haziness throughout both lungs, compatible with pulmonary edema. Small to moderate bilateral pleural effusions noted. Underlying emphysema noted. 5. Mildly enlarged subcarinal node, measuring 1.3 cm in short axis, of uncertain significance. 6. Mild narrowing at the proximal left renal artery, and mild narrowing at the origin of the celiac trunk. 7. Mild diverticulosis along the distal descending and proximal sigmoid colon, without evidence of diverticulitis. 8. Cholelithiasis. Gallbladder otherwise unremarkable. 9. Mildly dilated common bile duct, measuring 1.1 cm in caliber. Would correlate with LFTs. 10. Small left renal cyst noted. 11. Small mildly calcified uterine fibroid. Aortic Atherosclerosis (ICD10-I70.0) and Emphysema (ICD10-J43.9). Electronically Signed   By: Garald Balding M.D.   On: 12/10/2018 19:31   US Abdomen Limited Ruq  Result Date: 12/10/2018 CLINICAL DATA:   Cholelithiasis on CT. EXAM: ULTRASOUND ABDOMEN LIMITED RIGHT UPPER QUADRANT COMPARISON:  CT 12/10/2018 FINDINGS: Gallbladder: Multiple stones and sludge layering in the gallbladder. Largest stones measure up to about 5 mm diameter. No gallbladder wall thickening or edema. Murphy's sign is negative. Common bile duct: Diameter: 6 mm, normal Liver: No focal lesion identified. Within normal limits in parenchymal echogenicity. Portal vein is patent on color Doppler imaging with normal direction of blood flow towards the liver. Incidental note of small right pleural effusion. IMPRESSION: Cholelithiasis and gallbladder sludge without additional changes to suggest cholecystitis. Right pleural effusion. Electronically Signed   By: Lucienne Capers M.D.   On: 12/10/2018 21:18   IMPRESSION AND PLAN:   A/P: 15F w/ PMHx chronic systolic CHF (EF 74-12% as of 12/10/2016 Echo), CAD, AAA (s/p stent-graft), dementia (AAOx1 to self only) p/w SOB/DOE, acute hypoxemic respiratory failure, acute on chronic systolic  congestive heart failure exacerbation. Abdominal discomfort. Hyponatremia, hyperglycemia, Cr elevation/CKD III, transaminasemia, mild hyperbilirubinemia, hypoproteinemia, BNP elevation, Troponin elevation, leukopenia (w/o neutropenia). -SOB/DOE, acute hypoxemic respiratory failure, acute on chronic systolic congestive heart failure exacerbation, hyponatremia, BNP elevation, Troponin elevation: SpO2 stable at rest. Rapidly desaturates with minimal activity. Started on [place] O2 in ED. Diffuse crackles on lung exam. Na+ 134, free water excess. BNP 1591. Trop-I 0.07 (Hx leak, 0.04-0.10 in 05/2016). CT imaging (+) pleural effusions + pulmonary edema. Trend Trop-I. Cardiac monitoring, pulse ox. Lasix, I&O, daily weight, free water restriction, O2. ASA, morphine, NTG. Last Echo 11/2016, rpt pending. Known depressed EF, start Lisinopril low-dose as BP tolerates. c/w Lipitor, Coreg. -Abdominal discomfort, transaminasemia, mild  hyperbilirubinemia: Endorses mild diffuse abdominal discomfort, (-) N/V/D. RUQ U/S (+) "Cholelithiasis and gallbladder sludge without additional changes to suggest cholecystitis." AST + ALT mildly elevated, < 2x ULN (statin resumed). May have passed stone. Unclear. Pain ctrl, symptomatic mgmt. -Cr elevation, CKD III: Cr 1.09. At baseline. Monitor BMP, avoid nephrotoxins. -Leukopenia: WBC 3.7, ANC 2200, (-) neutropenia. WBC 3.9 (07/11/2018). -c/w home meds/formulary subs as tolerated. -FEN/GI: Cardiac free water restricted diet as tolerated. -DVT PPx: Lovenox. -Code status: Full code. -Disposition: Admission, > 2 midnights.   All the records are reviewed and case discussed with ED provider. Management plans discussed with the patient, family and they are in agreement.  CODE STATUS: Full code.  TOTAL TIME TAKING CARE OF THIS PATIENT: 75 minutes.    Arta Silence M.D on 12/10/2018 at 10:11 PM  Between 7am to 6pm - Pager - [phone]  After 6pm go to [email or web address] - Proofreader  [facility]  Office  [phone]  CC: Primary care physician; [name], MD   Note: This dictation was prepared with Dragon dictation along with smaller phrase technology. Any transcriptional errors that result from this process are unintentional.

## 2018-12-10 NOTE — ED Provider Notes (Signed)
[facility] Emergency Department Provider Note ____________________________________________   First MD Initiated Contact with Patient 12/10/18 1737     (approximate)  I have reviewed the triage vital signs and the nursing notes.   HISTORY  Chief Complaint Abdominal Pain  Level 5 caveat: History of present illness limited due to dementia  HPI Karen Collins is a 85 y.o. female with PMH as noted below who presents with abdominal pain, acute onset sometime today and associated with shortness of breath.  It is mostly in her lower abdomen and bilateral.  The patient denies vomiting or any change in her bowel movements.  She is unsure if she has had pain like this before or exactly when it started and states "I'm in the moment right now."  Past Medical History:  Diagnosis Date  . AAA (abdominal aortic aneurysm) ([place])    s/p stent   . Anxiety   . Arthritis   . CAD (coronary artery disease)   . Chronic kidney disease (CKD), stage III (moderate) (HCC)   . Chronic systolic congestive heart failure (HCC)    EF of 35 %   . Dementia ([place])   . Depression   . Diverticulitis    H/O  . GERD (gastroesophageal reflux disease)   . GI bleed   . Hx of migraines   . Hx: UTI (urinary tract infection)   . Hyperlipidemia   . Hypertension   . Kidney stones   . MI (myocardial infarction) ([place])    Non-ST elevation MI  . Renal cell carcinoma   . Thyroid disease     Patient Active Problem List   Diagnosis Date Noted  . MDD (major depressive disorder), recurrent episode, moderate ([place]) 01/12/2018  . Compliance with medication regimen 07/21/2017  . Chronic systolic heart failure ([place]) 05/25/2016  . HTN (hypertension) 05/25/2016  . Hypokalemia 05/10/2016  . Hypocalcemia 05/10/2016  . Health care maintenance 11/15/2014  . Urinary frequency 07/11/2014  . Leukopenia 07/11/2014  . Sleeping difficulty 03/18/2014  . Anxiety 07/22/2013  . AAA (abdominal aortic  aneurysm) ([place]) 06/01/2013  . History of renal cell cancer 06/01/2013  . Hyperparathyroidism ([place]) 06/01/2013  . CKD (chronic kidney disease), stage III ([place]) 06/01/2013  . Memory change 06/01/2013  . History of GI bleed 06/01/2013  . Anemia, iron deficiency 06/01/2013  . Portal vein aneurysm 06/01/2013  . ISCHEMIC CARDIOMYOPATHY 11/27/2010  . CAROTID BRUIT, LEFT 11/09/2010  . Hypercholesterolemia 11/08/2010  . Coronary atherosclerosis 11/08/2010  . Carotid stenosis 11/08/2010  . GI BLEEDING 11/08/2010    Past Surgical History:  Procedure Laterality Date  . ABDOMINAL AORTIC ANEURYSM REPAIR     s/p stent  . ABDOMINAL AORTIC ENDOVASCULAR STENT GRAFT    . CARDIAC CATHETERIZATION    . CARDIAC CATHETERIZATION  09/18/2010  . CHOLECYSTECTOMY    . CORONARY STENT PLACEMENT  09/18/2010   CAD; MI s/p stent  . LAPAROSCOPIC PARTIAL NEPHRECTOMY  2010  . NEPHRECTOMY  10/2008   Right  . PARATHYROIDECTOMY    . SHOULDER SURGERY  07/2010   Right  . THYROIDECTOMY      Prior to Admission medications   Medication Sig Start Date End Date Taking? Authorizing Provider  atorvastatin (LIPITOR) 40 MG tablet Take 1 tablet (40 mg total) by mouth daily at 6 PM. 07/11/18  Yes [name], MD  carvedilol (COREG) 3.125 MG tablet TAKE ONE TABLET TWICE DAILY WITH A MEAL 07/11/18  Yes [name], MD    Allergies Tramadol  Family History  Problem Relation Age of Onset  . Arthritis Mother   . Heart disease Mother   . Pulmonary fibrosis Mother   . Heart disease Father   . Hodgkin's lymphoma Father   . Breast cancer Sister   . Hyperlipidemia Sister     Social History Social History   Tobacco Use  . Smoking status: Former Smoker    Packs/day: 1.00    Years: 30.00    Pack years: 30.00    Last attempt to quit: 10/01/2005    Years since quitting: 13.2  . Smokeless tobacco: Never Used  Substance Use Topics  . Alcohol use: Yes    Alcohol/week: 7.0 standard drinks    Types: 4 Glasses of  wine, 3 Standard drinks or equivalent per week    Comment: Wine 3-4 times a week   . Drug use: No    Review of Systems Level 5 caveat: Review of systems limited due to dementia Constitutional: No fever. Cardiovascular: Denies chest pain. Respiratory: Denies shortness of breath. Gastrointestinal: No vomiting or diarrhea.  Genitourinary: Negative for dysuria.  Musculoskeletal: Negative for back pain. Neurological: Negative for headache.   ____________________________________________   PHYSICAL EXAM:  VITAL SIGNS: ED Triage Vitals [12/10/18 1740]  Enc Vitals Group     BP      Pulse      Resp      Temp      Temp src      SpO2 99 %     Weight      Height      Head Circumference      Peak Flow      Pain Score      Pain Loc      Pain Edu?      Excl. in [place]?     Constitutional: Alert and oriented to person and place.  Uncomfortable appearing but in no acute distress. Eyes: Conjunctivae are normal.  No scleral icterus. Head: Atraumatic. Nose: No congestion/rhinnorhea. Mouth/Throat: Mucous membranes are moist.   Neck: Normal range of motion.  Cardiovascular: Normal rate, regular rhythm. Grossly normal heart sounds.  Good peripheral circulation. Respiratory: Increased respiratory effort.  No retractions. Lungs CTAB. Gastrointestinal: Soft with mild bilateral lower quadrant tenderness. No distention.  Genitourinary: No flank tenderness. Musculoskeletal: No lower extremity edema.  Extremities warm and well perfused.  Neurologic:  Normal speech and language. No gross focal neurologic deficits are appreciated.  Skin:  Skin is warm and dry. No rash noted. Psychiatric: Mood and affect are normal. Speech and behavior are normal.  ____________________________________________   LABS (all labs ordered are listed, but only abnormal results are displayed)  Labs Reviewed  BASIC METABOLIC PANEL - Abnormal; Notable for the following components:      Result Value   Sodium 134 (*)     CO2 21 (*)    Glucose, Bld 113 (*)    Creatinine, Ser 1.09 (*)    GFR calc non Af Amer 47 (*)    GFR calc Af Amer 55 (*)    All other components within normal limits  HEPATIC FUNCTION PANEL - Abnormal; Notable for the following components:   Total Protein 5.8 (*)    AST 59 (*)    ALT 49 (*)    Bilirubin, Direct 0.3 (*)    All other components within normal limits  CBC WITH DIFFERENTIAL/PLATELET - Abnormal; Notable for the following components:   WBC 3.7 (*)    All other components within normal limits  TROPONIN I -  Abnormal; Notable for the following components:   Troponin I 0.07 (*)    All other components within normal limits  URINALYSIS, COMPLETE (UACMP) WITH MICROSCOPIC - Abnormal; Notable for the following components:   Color, Urine YELLOW (*)    APPearance CLEAR (*)    Ketones, ur 5 (*)    Protein, ur 30 (*)    All other components within normal limits  BRAIN NATRIURETIC PEPTIDE - Abnormal; Notable for the following components:   B Natriuretic Peptide 1,591.0 (*)    All other components within normal limits  LIPASE, BLOOD   ____________________________________________  EKG  ED ECG REPORT I, Arta Silence, the attending physician, personally viewed and interpreted this ECG.  Date: 12/10/2018 EKG Time: 1753 Rate: 94 Rhythm: normal sinus rhythm QRS Axis: normal Intervals: normal ST/T Wave abnormalities: Nonspecific lateral ST abnormalities Narrative Interpretation: Nonspecific abnormalities with no significant change when compared to EKG of 12/10/2017; no evidence of acute ischemia  ____________________________________________  RADIOLOGY  CXR: Cardiomegaly with pulmonary edema CT angio chest/abdomen/pelvis: Multiple chronic appearing vascular findings.  Cholelithiasis.  Dilated CBD. US abdomen RUQ: Pending  ____________________________________________   PROCEDURES  Procedure(s) performed: No  Procedures  Critical Care performed: Yes  CRITICAL  CARE Performed by: Arta Silence   Total critical care time: 20 minutes  Critical care time was exclusive of separately billable procedures and treating other patients.  Critical care was necessary to treat or prevent imminent or life-threatening deterioration.  Critical care was time spent personally by me on the following activities: development of treatment plan with patient and/or surrogate as well as nursing, discussions with consultants, evaluation of patient's response to treatment, examination of patient, obtaining history from patient or surrogate, ordering and performing treatments and interventions, ordering and review of laboratory studies, ordering and review of radiographic studies, pulse oximetry and re-evaluation of patient's condition. ____________________________________________   INITIAL IMPRESSION / ASSESSMENT AND PLAN / ED COURSE  Pertinent labs & imaging results that were available during my care of the patient were reviewed by me and considered in my medical decision making (see chart for details).  85 year old female with PMH as noted above including CAD, AAA status post stenting, chronic kidney disease, and cancer presents with bilateral lower abdominal pain starting sometime today and associated with shortness of breath.  I reviewed the past medical records in [place].  The patient was last seen in the ED 1 year ago with a syncopal episode.  She has had no further ED visits or admissions since then.  On exam the patient is somewhat anxious and uncomfortable appearing but in no acute distress.  The abdomen is soft with some discomfort on palpation of the lower abdomen but no peritoneal signs.  Lungs are clear.  The remainder of the exam is unremarkable.  Overall given the patient's relatively well appearance and stable vital signs I have a low suspicion for AAA rupture or other vascular etiology although this is still of concern.  Differential also includes  diverticulitis, colitis, UTI/cystitis, volvulus, bowel obstruction, gastritis, or less likely hepatobiliary cause.  We will obtain lab work-up, CT abdomen, chest x-ray, and give analgesia and antiemetic.  ----------------------------------------- 8:40 PM on 12/10/2018 -----------------------------------------  CT shows multiple chronic appearing vascular findings which are not consistent with the patient's acute presentation.  She also has cholelithiasis and a dilated CBD although this may be chronic as well.  Lab work-up reveals mildly elevated LFTs.  UA is negative.  Troponin is slightly elevated but the patient has a history  of this.  CT and chest x-ray also reveal evidence of pulmonary edema.  The patient is comfortable at rest but with even minimal exertion such as getting up, she becomes acutely short of breath and hypoxic to the high 80s.  Therefore, the patient will need admission for diuresis to treat CHF exacerbation.  I will add on a right upper quadrant ultrasound to further evaluate the gallbladder and biliary tree.  However, the patient no longer has any significant abdominal pain.  I discussed the plan of care with the patient and her brother in law who both agree.  The brother-in-law is attempting to ascertain who the patient's power of attorney or healthcare proxy is but he believes it is his daughter.  I signed the patient out to the hospitalist Dr. Justice Deeds for admission. ____________________________________________   FINAL CLINICAL IMPRESSION(S) / ED DIAGNOSES  Final diagnoses:  Cholelithiasis  Acute respiratory failure with hypoxia (HCC)  Acute on chronic congestive heart failure, unspecified heart failure type ([place])      NEW MEDICATIONS STARTED DURING THIS VISIT:  New Prescriptions   No medications on file     Note:  This document was prepared using Dragon voice recognition software and may include unintentional dictation errors.    Arta Silence,  MD 12/10/18 2046

## 2018-12-10 NOTE — ED Notes (Signed)
Pt up to toilet to urinate at this time  

## 2018-12-11 ENCOUNTER — Inpatient Hospital Stay: Payer: PPO

## 2018-12-11 ENCOUNTER — Inpatient Hospital Stay (HOSPITAL_COMMUNITY)
Admit: 2018-12-11 | Discharge: 2018-12-11 | Disposition: A | Discharge status: Home / Self Care | Payer: PPO | Attending: Internal Medicine | Admitting: Internal Medicine

## 2018-12-11 DIAGNOSIS — I340 Nonrheumatic mitral (valve) insufficiency: Secondary | ICD-10-CM

## 2018-12-11 LAB — ECHOCARDIOGRAM COMPLETE
Height: 64 in
Weight: 2137.6 oz

## 2018-12-11 LAB — TROPONIN I
TROPONIN I: 0.11 ng/mL — AB (ref <0.03)
TROPONIN I: 0.11 ng/mL — AB (ref <0.03)

## 2018-12-11 LAB — PROCALCITONIN: Procalcitonin: 0.25 ng/mL

## 2018-12-11 LAB — PHOSPHORUS: Phosphorus: 2.8 mg/dL (ref 2.5–4.6)

## 2018-12-11 LAB — MAGNESIUM: Magnesium: 2.3 mg/dL (ref 1.7–2.4)

## 2018-12-11 MED ORDER — SENNOSIDES-DOCUSATE SODIUM 8.6-50 MG PO TABS
1.0000 | ORAL_TABLET | Freq: Every evening | ORAL | Status: DC | PRN
  Filled 2018-12-11: qty 1

## 2018-12-11 MED ORDER — ONDANSETRON HCL 4 MG PO TABS
4.0000 mg | ORAL_TABLET | Freq: Four times a day (QID) | ORAL | Status: DC | PRN
  Filled 2018-12-11: qty 1

## 2018-12-11 MED ORDER — CARVEDILOL 3.125 MG PO TABS
3.1250 mg | ORAL_TABLET | Freq: Two times a day (BID) | ORAL | Status: DC
  Administered 2018-12-11 – 2018-12-14 (×7): 3.125 mg via ORAL
  Filled 2018-12-11 (×7): qty 1

## 2018-12-11 MED ORDER — FUROSEMIDE 10 MG/ML IJ SOLN
20.0000 mg | Freq: Two times a day (BID) | INTRAMUSCULAR | Status: DC
  Administered 2018-12-11: 20 mg via INTRAVENOUS
  Filled 2018-12-11 (×2): qty 2

## 2018-12-11 MED ORDER — ENOXAPARIN SODIUM 40 MG/0.4ML ~~LOC~~ SOLN
40.0000 mg | SUBCUTANEOUS | Status: DC
  Administered 2018-12-11 (×2): 40 mg via SUBCUTANEOUS
  Filled 2018-12-11 (×2): qty 0.4

## 2018-12-11 MED ORDER — ACETAMINOPHEN 325 MG PO TABS
650.0000 mg | ORAL_TABLET | Freq: Four times a day (QID) | ORAL | Status: DC | PRN
  Filled 2018-12-11: qty 2

## 2018-12-11 MED ORDER — ATORVASTATIN CALCIUM 20 MG PO TABS
40.0000 mg | ORAL_TABLET | Freq: Every day | ORAL | Status: DC
  Administered 2018-12-11 – 2018-12-13 (×3): 40 mg via ORAL
  Filled 2018-12-11 (×4): qty 2

## 2018-12-11 MED ORDER — LISINOPRIL 5 MG PO TABS
2.5000 mg | ORAL_TABLET | Freq: Every day | ORAL | Status: DC
  Administered 2018-12-11 – 2018-12-12 (×2): 2.5 mg via ORAL
  Filled 2018-12-11 (×2): qty 1

## 2018-12-11 MED ORDER — ACETAMINOPHEN 650 MG RE SUPP
650.0000 mg | Freq: Four times a day (QID) | RECTAL | Status: DC | PRN
  Filled 2018-12-11: qty 1

## 2018-12-11 MED ORDER — FUROSEMIDE 10 MG/ML IJ SOLN
INTRAMUSCULAR | Status: AC
  Administered 2018-12-11: 08:00:00
  Filled 2018-12-11: qty 4

## 2018-12-11 MED ORDER — BISACODYL 5 MG PO TBEC
5.0000 mg | DELAYED_RELEASE_TABLET | Freq: Every day | ORAL | Status: DC | PRN
  Filled 2018-12-11: qty 1

## 2018-12-11 MED ORDER — ONDANSETRON HCL 4 MG/2ML IJ SOLN: 4.0000 mg | Freq: Four times a day (QID) | INTRAMUSCULAR | Status: DC | PRN

## 2018-12-11 NOTE — Progress Notes (Signed)
*  PRELIMINARY RESULTS* Echocardiogram 2D Echocardiogram has been performed.  Karen Collins 12/11/2018, 1:49 PM

## 2018-12-11 NOTE — ED Notes (Signed)
Pt repositioned in bed and given a diet coke.

## 2018-12-11 NOTE — Progress Notes (Signed)
[place] [facility] [place] NAME: Karen Collins    MR#:  [id number]  DATE OF BIRTH:  [birth year]  SUBJECTIVE:  CHIEF COMPLAINT:   Chief Complaint  Patient presents with   Abdominal Pain   -Patient is alert and oriented to self at this time.  Some impulsive behavior, has a sitter at bedside. -Came in with left groin pain.  Much improved now.  No nausea or vomiting.  Noted to be in CHF.  REVIEW OF SYSTEMS:  Review of Systems  Constitutional: Positive for malaise/fatigue. Negative for chills and fever.  HENT: Negative for congestion, ear discharge, hearing loss and nosebleeds.   Eyes: Negative for blurred vision and double vision.  Respiratory: Negative for cough, shortness of breath and wheezing.   Cardiovascular: Negative for chest pain, palpitations and leg swelling.  Gastrointestinal: Positive for abdominal pain. Negative for constipation, diarrhea, nausea and vomiting.  Genitourinary: Negative for dysuria.  Musculoskeletal: Negative for myalgias.  Neurological: Negative for dizziness, focal weakness, seizures, weakness and headaches.  Psychiatric/Behavioral: Negative for depression.    DRUG ALLERGIES:   Allergies  Allergen Reactions   Tramadol Rash    VITALS:  Blood pressure 119/88, pulse 85, temperature (!) 97.4 F (36.3 C), temperature source Oral, resp. rate (!) 28, height 5\' 4"  (1.626 m), weight 60.6 kg, SpO2 100 %.  PHYSICAL EXAMINATION:  Physical Exam   GENERAL:  85 y.o.-year-old patient lying in the bed with no acute distress.  EYES: Pupils equal, round, reactive to light and accommodation. No scleral icterus. Extraocular muscles intact.  HEENT: Head atraumatic, normocephalic. Oropharynx and nasopharynx clear.  NECK:  Supple, no jugular venous distention. No thyroid enlargement, no tenderness.  LUNGS: Normal breath sounds bilaterally, no wheezing, rales,rhonchi or crepitation. No use of accessory muscles of  respiration.  Decreased bibasilar breath sounds CARDIOVASCULAR: S1, S2 normal. No  rubs, or gallops.  2/6 systolic murmur is present ABDOMEN: Soft, nontender, nondistended. Bowel sounds present. No organomegaly or mass.  EXTREMITIES: No pedal edema, cyanosis, or clubbing.  NEUROLOGIC: Cranial nerves II through XII are intact. Muscle strength 5/5 in all extremities. Sensation intact. Gait not checked.  Overall weakness noted PSYCHIATRIC: The patient is alert and oriented x self and person.  Impulsive SKIN: No obvious rash, lesion, or ulcer.    LABORATORY PANEL:   CBC Recent Labs  Lab 12/10/18 1758  WBC 3.7*  HGB 13.4  HCT 41.6  PLT 182   ------------------------------------------------------------------------------------------------------------------  Chemistries  Recent Labs  Lab 12/10/18 1758 12/10/18 2310  NA 134*  --   K 4.1  --   CL 104  --   CO2 21*  --   GLUCOSE 113*  --   BUN 15  --   CREATININE 1.09*  --   CALCIUM 9.6  --   MG  --  2.3  AST 59*  --   ALT 49*  --   ALKPHOS 71  --   BILITOT 1.2  --    ------------------------------------------------------------------------------------------------------------------  Cardiac Enzymes Recent Labs  Lab 12/11/18 0200  TROPONINI 0.11*   ------------------------------------------------------------------------------------------------------------------  RADIOLOGY:  Dg Chest Portable 1 View  Result Date: 12/10/2018 CLINICAL DATA:  Dyspnea, history of renal cancer EXAM: PORTABLE CHEST 1 VIEW COMPARISON:  Chest CT 12/10/2018 FINDINGS: Cardiomegaly with nonaneurysmal atherosclerotic aorta is noted. Diffuse mild-to-moderate interstitial edema is noted as noted same day chest CT however the bilateral pleural effusions are less apparent radiographically. Partially visualized abdominal aortic stent graft noted. Acute nor aggressive  osseous abnormality IMPRESSION: Cardiomegaly with mild-to-moderate pulmonary edema, similar in  appearance to prior earlier chest CT. Electronically Signed   By: Ashley Royalty M.D.   On: 12/10/2018 19:48   Ct Angio Chest/abd/pel For Dissection W And/or Wo Contrast  Result Date: 12/10/2018 CLINICAL DATA:  Acute onset of generalized abdominal pain and difficulty breathing. EXAM: CT ANGIOGRAPHY CHEST, ABDOMEN AND PELVIS TECHNIQUE: Multidetector CT imaging through the chest, abdomen and pelvis was performed using the standard protocol during bolus administration of intravenous contrast. Multiplanar reconstructed images and MIPs were obtained and reviewed to evaluate the vascular anatomy. CONTRAST:  88mL OMNIPAQUE IOHEXOL 350 MG/ML SOLN COMPARISON:  None. FINDINGS: CTA CHEST FINDINGS Cardiovascular: There is no evidence of aortic dissection. There is no evidence of aneurysmal dilatation. Scattered calcification is noted along the thoracic aorta and proximal great vessels, with mild luminal narrowing at the left carotid artery, and a prominent focal thrombus resulting in severe luminal narrowing at the proximal left subclavian artery. The heart is mildly enlarged. Mediastinum/Nodes: A mildly enlarged subcarinal node is noted, measuring 1.3 cm in short axis. No additional mediastinal lymphadenopathy is seen. No pericardial effusion is identified. The visualized portions of the thyroid gland are unremarkable. No axillary lymphadenopathy is seen. Lungs/Pleura: Small to moderate bilateral pleural effusions are noted. Diffuse interstitial prominence and haziness is noted throughout both lungs, compatible with pulmonary edema. Underlying emphysema is noted. No pneumothorax is seen. No masses are identified. Musculoskeletal: No acute osseous abnormalities are identified. The patient's right shoulder arthroplasty is only partially imaged on this study. The visualized musculature is unremarkable in appearance. Review of the MIP images confirms the above findings. CTA ABDOMEN AND PELVIS FINDINGS VASCULAR Aorta: Scattered  calcification is noted along the abdominal aorta, with mild irregularity of the aortic wall, but no definite penetrating aortic ulceration. The patient's aortoiliac stent graft appears patent, with mild associated mural thrombus but no significant luminal narrowing. No significant aneurysmal dilatation is seen. There is no evidence of aortic dissection at this time. Celiac: There is mild focal narrowing at the origin of the celiac trunk. SMA: There is minimal narrowing at the proximal superior mesenteric artery, with associated calcification. Renals: The patient is status post right-sided nephrectomy. There is mild narrowing at the proximal left renal artery, with associated calcification. IMA: The inferior mesenteric artery is not definitely seen. Inflow: The internal and external iliac arteries appear grossly intact. The visualized portions of the common femoral arteries and their branches are unremarkable. Veins: Visualized venous structures are unremarkable in appearance. Review of the MIP images confirms the above findings. NON-VASCULAR Hepatobiliary: The liver is unremarkable in appearance. Small layering stones are noted at the gallbladder. The gallbladder is otherwise unremarkable. The common bile duct is mildly dilated, measuring 1.1 cm in caliber. Pancreas: The pancreas is within normal limits. An apparent cystic focus of the head of the pancreas is thought to reflect normal duct structures. Spleen: The spleen is unremarkable in appearance. Adrenals/Urinary Tract: The adrenal glands are unremarkable in appearance. The patient is status post right-sided nephrectomy. A small left renal cyst is noted. There is no evidence of hydronephrosis. No renal or ureteral stones are identified. No perinephric stranding is seen. Stomach/Bowel: The stomach is unremarkable in appearance. The small bowel is within normal limits. The appendix is not visualized; there is no evidence for appendicitis. Mild diverticulosis is  noted along the distal descending and proximal sigmoid colon, without evidence of diverticulitis. Lymphatic: No retroperitoneal or pelvic sidewall lymphadenopathy is seen. Reproductive: The bladder  is moderately distended and grossly unremarkable. A small mildly calcified uterine fibroid is noted. No suspicious adnexal masses are seen. Other: An anterior abdominal wall mesh is noted. Musculoskeletal: No acute osseous abnormalities are identified. There is grade 2 anterolisthesis of L4 on L5, with associated vacuum phenomenon and underlying facet disease. The visualized musculature is unremarkable in appearance. Review of the MIP images confirms the above findings. IMPRESSION: 1. No evidence of aortic dissection. No evidence of aneurysmal dilatation. 2. Aortoiliac stent graft appears patent, with mild associated mural thrombus but no significant luminal narrowing. 3. Prominent focal thrombus noted at the proximal left subclavian artery, resulting in severe focal luminal narrowing. 4. Mild cardiomegaly. Diffuse interstitial prominence and haziness throughout both lungs, compatible with pulmonary edema. Small to moderate bilateral pleural effusions noted. Underlying emphysema noted. 5. Mildly enlarged subcarinal node, measuring 1.3 cm in short axis, of uncertain significance. 6. Mild narrowing at the proximal left renal artery, and mild narrowing at the origin of the celiac trunk. 7. Mild diverticulosis along the distal descending and proximal sigmoid colon, without evidence of diverticulitis. 8. Cholelithiasis. Gallbladder otherwise unremarkable. 9. Mildly dilated common bile duct, measuring 1.1 cm in caliber. Would correlate with LFTs. 10. Small left renal cyst noted. 11. Small mildly calcified uterine fibroid. Aortic Atherosclerosis (ICD10-I70.0) and Emphysema (ICD10-J43.9). Electronically Signed   By: Garald Balding M.D.   On: 12/10/2018 19:31   US Abdomen Limited Ruq  Result Date: 12/10/2018 CLINICAL DATA:   Cholelithiasis on CT. EXAM: ULTRASOUND ABDOMEN LIMITED RIGHT UPPER QUADRANT COMPARISON:  CT 12/10/2018 FINDINGS: Gallbladder: Multiple stones and sludge layering in the gallbladder. Largest stones measure up to about 5 mm diameter. No gallbladder wall thickening or edema. Murphy's sign is negative. Common bile duct: Diameter: 6 mm, normal Liver: No focal lesion identified. Within normal limits in parenchymal echogenicity. Portal vein is patent on color Doppler imaging with normal direction of blood flow towards the liver. Incidental note of small right pleural effusion. IMPRESSION: Cholelithiasis and gallbladder sludge without additional changes to suggest cholecystitis. Right pleural effusion. Electronically Signed   By: Lucienne Capers M.D.   On: 12/10/2018 21:18    EKG:   Orders placed or performed during the hospital encounter of 12/10/18   ED EKG   ED EKG    ASSESSMENT AND PLAN:   85 year old female with multiple medical problems including hypertension, CAD, hypothyroidism, depression anxiety, AAA, CKD, CHF and dementia presents to hospital secondary to abdominal pain.  1.  Lower abdominal pain-musculoskeletal pain in the left groin. -CT of the abdomen showing gallstones but no acute cholecystitis symptoms -Symptoms have resolved and patient is tolerating diet well.  2.  Acute on chronic systolic CHF exacerbation-mid to telemetry -Strict input and output monitoring.  Daily weights -Salt restriction and fluid restriction. -BNP is significantly elevated.  Continue IV Lasix and monitor -Continue cardiac medications.  Patient on Coreg, statin and lisinopril -Needing 2 to 3 L oxygen acutely.  Wean off as tolerated  3.  Elevated troponins-likely demand ischemia not an STEMI.  Patient denies any chest pain  4.  Confusion and disorientation-likely secondary to underlying dementia.  No family available, unknown baseline.  CT head to rule out any acute underlying factors. -UA negative for  any infection  5.  DVT prophylaxis-on Lovenox  Physical therapy consult requested   All the records are reviewed and case discussed with Care Management/Social Workerr. Management plans discussed with the patient, family and they are in agreement.  CODE STATUS: Full Code  TOTAL  TIME TAKING CARE OF THIS PATIENT: 38 minutes.   POSSIBLE D/C IN 2 DAYS, DEPENDING ON CLINICAL CONDITION.   Gladstone Lighter M.D on 12/11/2018 at 2:48 PM  Between 7am to 6pm - Pager - [phone]  After 6pm go to [email or web address] - password [email or web address]  Office  [phone]  CC: Primary care physician; [name], MD

## 2018-12-11 NOTE — ED Notes (Signed)
Call family when room assignment

## 2018-12-11 NOTE — ED Notes (Signed)
Pt took a bottle of nitroglycerin out of her purse with this tech and Andy,EDT in the rm. The bottle of medicine was immediately taken from pt and shown to [place]. Medicine placed into a clear plastic bag and placed in pt belongings bag.

## 2018-12-11 NOTE — Plan of Care (Signed)
  Problem: Education: Goal: Knowledge of General Education information will improve Description Including pain rating scale, medication(s)/side effects and non-pharmacologic comfort measures Outcome: Progressing   Problem: Health Behavior/Discharge Planning: Goal: Ability to manage health-related needs will improve Outcome: Progressing   Problem: Clinical Measurements: Goal: Ability to maintain clinical measurements within normal limits will improve Outcome: Progressing Goal: Will remain free from infection Outcome: Progressing Goal: Diagnostic test results will improve Outcome: Progressing Goal: Respiratory complications will improve Outcome: Progressing Goal: Cardiovascular complication will be avoided Outcome: Progressing   Problem: Activity: Goal: Risk for activity intolerance will decrease Outcome: Progressing   Problem: Nutrition: Goal: Adequate nutrition will be maintained Outcome: Progressing   Problem: Coping: Goal: Level of anxiety will decrease Outcome: Progressing   Problem: Elimination: Goal: Will not experience complications related to bowel motility Outcome: Progressing Goal: Will not experience complications related to urinary retention Outcome: Progressing   Problem: Pain Managment: Goal: General experience of comfort will improve Outcome: Progressing   Problem: Safety: Goal: Ability to remain free from injury will improve Outcome: Progressing   Problem: Skin Integrity: Goal: Risk for impaired skin integrity will decrease Outcome: Progressing   Problem: Skin Integrity: Goal: Risk for impaired skin integrity will decrease Outcome: Progressing   Problem: Safety: Goal: Ability to remain free from injury will improve Outcome: Progressing   Problem: Pain Managment: Goal: General experience of comfort will improve Outcome: Progressing

## 2018-12-11 NOTE — ED Notes (Signed)
Pt is settled with redirection, and following directions. Pt was given lunch.

## 2018-12-11 NOTE — ED Notes (Signed)
Dr. Lesly Dukes notified of elevated troponin of 0.11. no new verbal orders received.

## 2018-12-11 NOTE — ED Notes (Signed)
Pt has sitter for safety. Pt is Alert disoriented x3. Pt family at bedside.

## 2018-12-11 NOTE — ED Notes (Signed)
Pt has power of attorney brought in and left in room by son.

## 2018-12-11 NOTE — ED Notes (Signed)
External urethral catheter in place. Pt is confused, alert to self only. Pt repeatedly asks same questions. Pt pleasant, cooperative. Oxygen infusing at 2lpm via [place] currently. Pt with sinus rhythm noted on cardiac monitor. Call bell at right side, bed in low locked position, yellow non skid socks placed on feet, and yellow fall risk arm band placed on pt.

## 2018-12-11 NOTE — ED Notes (Signed)
ED TO INPATIENT HANDOFF REPORT  ED Nurse Name and Phone #: Rubin Payor [phone]  S Name/Age/Gender Karen Collins 85 y.o. female Room/Bed: ED31A/ED31A  Code Status   Code Status: Full Code  Home/SNF/Other Nursing Home Patient oriented to: self, place, time and situation Is this baseline? Yes   Triage Complete: Triage complete  Chief Complaint Abd Pain  Triage Note Pt arrive via ems from home. EMS report pt c/o abd pain, hx of dementia, gallbladder, kidney cancer. Pt oriented to self and situation. On arrival pt states she was having trouble breathing. MD present. Pt baseline o2 94%, placed on 2L [place] per md request   Allergies Allergies  Allergen Reactions  . Tramadol Rash    Level of Care/Admitting Diagnosis ED Disposition    ED Disposition Condition [facility] [place] [facility] Area: [place] [[id number]]  Level of Care: Telemetry [5]  Diagnosis: Acute hypoxemic respiratory failure [facility]) [[id number]]  Admitting Physician: Arta Silence [[id number]]  Attending Physician: Arta Silence [[id number]]  Estimated length of stay: past midnight tomorrow  Certification:: I certify this patient will need inpatient services for at least 2 midnights  PT Class (Do Not Modify): Inpatient [101]  PT Acc Code (Do Not Modify): Private [1]       B Medical/Surgery History Past Medical History:  Diagnosis Date  . AAA (abdominal aortic aneurysm) ([place])    s/p stent   . Anxiety   . Arthritis   . CAD (coronary artery disease)   . Chronic kidney disease (CKD), stage III (moderate) (HCC)   . Chronic systolic congestive heart failure (HCC)    EF of 35 %   . Dementia ([place])   . Depression   . Diverticulitis    H/O  . GERD (gastroesophageal reflux disease)   . GI bleed   . Hx of migraines   . Hx: UTI (urinary tract infection)   . Hyperlipidemia   . Hypertension   . Kidney stones   . MI (myocardial infarction) ([place])    Non-ST elevation MI  . Renal cell  carcinoma   . Thyroid disease    Past Surgical History:  Procedure Laterality Date  . ABDOMINAL AORTIC ANEURYSM REPAIR     s/p stent  . ABDOMINAL AORTIC ENDOVASCULAR STENT GRAFT    . CARDIAC CATHETERIZATION    . CARDIAC CATHETERIZATION  09/18/2010  . CHOLECYSTECTOMY    . CORONARY STENT PLACEMENT  09/18/2010   CAD; MI s/p stent  . LAPAROSCOPIC PARTIAL NEPHRECTOMY  2010  . NEPHRECTOMY  10/2008   Right  . PARATHYROIDECTOMY    . SHOULDER SURGERY  07/2010   Right  . THYROIDECTOMY       A IV Location/Drains/Wounds Patient Lines/Drains/Airways Status   Active Line/Drains/Airways    Name:   Placement date:   Placement time:   Site:   Days:   Peripheral IV 12/10/18 Right Antecubital   12/10/18    1741    Antecubital   1          Intake/Output Last 24 hours  Intake/Output Summary (Last 24 hours) at 12/11/2018 1440 Last data filed at 12/11/2018 0200 Gross per 24 hour  Intake -  Output 2300 ml  Net -2300 ml    Labs/Imaging Results for orders placed or performed during the hospital encounter of 12/10/18 (from the past 48 hour(s))  Basic metabolic panel     Status: Abnormal   Collection Time: 12/10/18  5:58 PM  Result Value Ref Range   Sodium  134 (L) 135 - 145 mmol/L   Potassium 4.1 3.5 - 5.1 mmol/L   Chloride 104 98 - 111 mmol/L   CO2 21 (L) 22 - 32 mmol/L   Glucose, Bld 113 (H) 70 - 99 mg/dL   BUN 15 8 - 23 mg/dL   Creatinine, Ser 1.09 (H) 0.44 - 1.00 mg/dL   Calcium 9.6 8.9 - 10.3 mg/dL   GFR calc non Af Amer 47 (L) >60 mL/min   GFR calc Af Amer 55 (L) >60 mL/min   Anion gap 9 5 - 15    Comment: Performed at [facility], [address] [place], [place] [address]  Hepatic function panel     Status: Abnormal   Collection Time: 12/10/18  5:58 PM  Result Value Ref Range   Total Protein 5.8 (L) 6.5 - 8.1 g/dL   Albumin 3.8 3.5 - 5.0 g/dL   AST 59 (H) 15 - 41 U/L   ALT 49 (H) 0 - 44 U/L   Alkaline Phosphatase 71 38 - 126 U/L   Total Bilirubin 1.2 0.3 - 1.2  mg/dL   Bilirubin, Direct 0.3 (H) 0.0 - 0.2 mg/dL   Indirect Bilirubin 0.9 0.3 - 0.9 mg/dL    Comment: Performed at [facility], [address] [place], [place] [address]  Lipase, blood     Status: None   Collection Time: 12/10/18  5:58 PM  Result Value Ref Range   Lipase 29 11 - 51 U/L    Comment: Performed at [facility], [address] [place], [place] [address]  CBC with Differential     Status: Abnormal   Collection Time: 12/10/18  5:58 PM  Result Value Ref Range   WBC 3.7 (L) 4.0 - 10.5 K/uL   RBC 4.42 3.87 - 5.11 MIL/uL   Hemoglobin 13.4 12.0 - 15.0 g/dL   HCT 41.6 36.0 - 46.0 %   MCV 94.1 80.0 - 100.0 fL   MCH 30.3 26.0 - 34.0 pg   MCHC 32.2 30.0 - 36.0 g/dL   RDW 13.3 11.5 - 15.5 %   Platelets 182 150 - 400 K/uL   nRBC 0.0 0.0 - 0.2 %   Neutrophils Relative % 60 %   Neutro Abs 2.2 1.7 - 7.7 K/uL   Lymphocytes Relative 30 %   Lymphs Abs 1.1 0.7 - 4.0 K/uL   Monocytes Relative 8 %   Monocytes Absolute 0.3 0.1 - 1.0 K/uL   Eosinophils Relative 1 %   Eosinophils Absolute 0.0 0.0 - 0.5 K/uL   Basophils Relative 1 %   Basophils Absolute 0.0 0.0 - 0.1 K/uL   Immature Granulocytes 0 %   Abs Immature Granulocytes 0.01 0.00 - 0.07 K/uL    Comment: Performed at [facility], [address] [place], [place] [address]  Troponin I - Once     Status: Abnormal   Collection Time: 12/10/18  5:58 PM  Result Value Ref Range   Troponin I 0.07 (HH) <0.03 ng/mL    Comment: CRITICAL RESULT CALLED TO, READ BACK BY AND VERIFIED WITH LORRIE LEMONS 12/10/18 @ [place] Performed at [facility], [address] [place],  [address]   Urinalysis, Complete w Microscopic     Status: Abnormal   Collection Time: 12/10/18  5:58 PM  Result Value Ref Range   Color, Urine YELLOW (A) YELLOW   APPearance CLEAR (A) CLEAR   Specific Gravity, Urine 1.010 1.005 - 1.030   pH 6.0 5.0 -  8.0   Glucose, UA NEGATIVE NEGATIVE mg/dL   Hgb urine dipstick  NEGATIVE NEGATIVE   Bilirubin Urine NEGATIVE NEGATIVE   Ketones, ur 5 (A) NEGATIVE mg/dL   Protein, ur 30 (A) NEGATIVE mg/dL   Nitrite NEGATIVE NEGATIVE   Leukocytes,Ua NEGATIVE NEGATIVE   RBC / HPF 0-5 0 - 5 RBC/hpf   WBC, UA 0-5 0 - 5 WBC/hpf   Bacteria, UA NONE SEEN NONE SEEN   Squamous Epithelial / LPF 0-5 0 - 5    Comment: Performed at [facility], [address] [place], [place] [address]  Brain natriuretic peptide     Status: Abnormal   Collection Time: 12/10/18  5:58 PM  Result Value Ref Range   B Natriuretic Peptide 1,591.0 (H) 0.0 - 100.0 pg/mL    Comment: Performed at [facility], [address] [place], [place] [address]  Magnesium     Status: None   Collection Time: 12/10/18 11:10 PM  Result Value Ref Range   Magnesium 2.3 1.7 - 2.4 mg/dL    Comment: Performed at [facility], [address] [place], [place] [address]  Phosphorus     Status: None   Collection Time: 12/10/18 11:10 PM  Result Value Ref Range   Phosphorus 2.8 2.5 - 4.6 mg/dL    Comment: Performed at [facility], [address] [place], [place] [address]  Troponin I - Once     Status: Abnormal   Collection Time: 12/10/18 11:10 PM  Result Value Ref Range   Troponin I 0.11 (HH) <0.03 ng/mL    Comment: CRITICAL VALUE NOTED. VALUE IS CONSISTENT WITH PREVIOUSLY REPORTED/CALLED VALUE SMA Performed at [facility], [address] [place], [place] [address]   Procalcitonin - Baseline     Status: None   Collection Time: 12/10/18 11:10 PM  Result Value Ref Range   Procalcitonin 0.25 ng/mL    Comment:        Interpretation: PCT (Procalcitonin) <= 0.5 ng/mL: Systemic infection (sepsis) is not likely. Local bacterial infection is possible. (NOTE)       Sepsis PCT Algorithm           Lower Respiratory Tract                                      Infection PCT Algorithm    ----------------------------     ----------------------------         PCT < 0.25  ng/mL                PCT < 0.10 ng/mL         Strongly encourage             Strongly discourage   discontinuation of antibiotics    initiation of antibiotics    ----------------------------     -----------------------------       PCT 0.25 - 0.50 ng/mL            PCT 0.10 - 0.25 ng/mL               OR       >80% decrease in PCT            Discourage initiation of  antibiotics      Encourage discontinuation           of antibiotics    ----------------------------     -----------------------------         PCT >= 0.50 ng/mL              PCT 0.26 - 0.50 ng/mL               AND        <80% decrease in PCT             Encourage initiation of                                             antibiotics       Encourage continuation           of antibiotics    ----------------------------     -----------------------------        PCT >= 0.50 ng/mL                  PCT > 0.50 ng/mL               AND         increase in PCT                  Strongly encourage                                      initiation of antibiotics    Strongly encourage escalation           of antibiotics                                     -----------------------------                                           PCT <= 0.25 ng/mL                                                 OR                                        > 80% decrease in PCT                                     Discontinue / Do not initiate                                             antibiotics Performed at [facility], [address] [place], [place] [address]   Troponin I - Once     Status: Abnormal   Collection Time: 12/11/18  2:00 AM  Result Value Ref Range   Troponin I 0.11 (HH) <0.03 ng/mL    Comment: CRITICAL VALUE NOTED. VALUE IS CONSISTENT WITH PREVIOUSLY REPORTED/CALLED VALUE SMA Performed at [facility], [address] [place], [place] [address]    Dg Chest Portable 1  View  Result Date: 12/10/2018 CLINICAL DATA:  Dyspnea, history of renal cancer EXAM: PORTABLE CHEST 1 VIEW COMPARISON:  Chest CT 12/10/2018 FINDINGS: Cardiomegaly with nonaneurysmal atherosclerotic aorta is noted. Diffuse mild-to-moderate interstitial edema is noted as noted same day chest CT however the bilateral pleural effusions are less apparent radiographically. Partially visualized abdominal aortic stent graft noted. Acute nor aggressive osseous abnormality IMPRESSION: Cardiomegaly with mild-to-moderate pulmonary edema, similar in appearance to prior earlier chest CT. Electronically Signed   By: Ashley Royalty M.D.   On: 12/10/2018 19:48   Ct Angio Chest/abd/pel For Dissection W And/or Wo Contrast  Result Date: 12/10/2018 CLINICAL DATA:  Acute onset of generalized abdominal pain and difficulty breathing. EXAM: CT ANGIOGRAPHY CHEST, ABDOMEN AND PELVIS TECHNIQUE: Multidetector CT imaging through the chest, abdomen and pelvis was performed using the standard protocol during bolus administration of intravenous contrast. Multiplanar reconstructed images and MIPs were obtained and reviewed to evaluate the vascular anatomy. CONTRAST:  86mL OMNIPAQUE IOHEXOL 350 MG/ML SOLN COMPARISON:  None. FINDINGS: CTA CHEST FINDINGS Cardiovascular: There is no evidence of aortic dissection. There is no evidence of aneurysmal dilatation. Scattered calcification is noted along the thoracic aorta and proximal great vessels, with mild luminal narrowing at the left carotid artery, and a prominent focal thrombus resulting in severe luminal narrowing at the proximal left subclavian artery. The heart is mildly enlarged. Mediastinum/Nodes: A mildly enlarged subcarinal node is noted, measuring 1.3 cm in short axis. No additional mediastinal lymphadenopathy is seen. No pericardial effusion is identified. The visualized portions of the thyroid gland are unremarkable. No axillary lymphadenopathy is seen. Lungs/Pleura: Small to moderate  bilateral pleural effusions are noted. Diffuse interstitial prominence and haziness is noted throughout both lungs, compatible with pulmonary edema. Underlying emphysema is noted. No pneumothorax is seen. No masses are identified. Musculoskeletal: No acute osseous abnormalities are identified. The patient's right shoulder arthroplasty is only partially imaged on this study. The visualized musculature is unremarkable in appearance. Review of the MIP images confirms the above findings. CTA ABDOMEN AND PELVIS FINDINGS VASCULAR Aorta: Scattered calcification is noted along the abdominal aorta, with mild irregularity of the aortic wall, but no definite penetrating aortic ulceration. The patient's aortoiliac stent graft appears patent, with mild associated mural thrombus but no significant luminal narrowing. No significant aneurysmal dilatation is seen. There is no evidence of aortic dissection at this time. Celiac: There is mild focal narrowing at the origin of the celiac trunk. SMA: There is minimal narrowing at the proximal superior mesenteric artery, with associated calcification. Renals: The patient is status post right-sided nephrectomy. There is mild narrowing at the proximal left renal artery, with associated calcification. IMA: The inferior mesenteric artery is not definitely seen. Inflow: The internal and external iliac arteries appear grossly intact. The visualized portions of the common femoral arteries and their branches are unremarkable. Veins: Visualized venous structures are unremarkable in appearance. Review of the MIP images confirms the above findings. NON-VASCULAR Hepatobiliary: The liver is unremarkable in appearance. Small layering stones are noted at the gallbladder. The gallbladder is otherwise unremarkable. The common bile duct is mildly dilated, measuring 1.1 cm in caliber. Pancreas: The pancreas is within normal limits. An apparent cystic focus of the head of  the pancreas is thought to reflect  normal duct structures. Spleen: The spleen is unremarkable in appearance. Adrenals/Urinary Tract: The adrenal glands are unremarkable in appearance. The patient is status post right-sided nephrectomy. A small left renal cyst is noted. There is no evidence of hydronephrosis. No renal or ureteral stones are identified. No perinephric stranding is seen. Stomach/Bowel: The stomach is unremarkable in appearance. The small bowel is within normal limits. The appendix is not visualized; there is no evidence for appendicitis. Mild diverticulosis is noted along the distal descending and proximal sigmoid colon, without evidence of diverticulitis. Lymphatic: No retroperitoneal or pelvic sidewall lymphadenopathy is seen. Reproductive: The bladder is moderately distended and grossly unremarkable. A small mildly calcified uterine fibroid is noted. No suspicious adnexal masses are seen. Other: An anterior abdominal wall mesh is noted. Musculoskeletal: No acute osseous abnormalities are identified. There is grade 2 anterolisthesis of L4 on L5, with associated vacuum phenomenon and underlying facet disease. The visualized musculature is unremarkable in appearance. Review of the MIP images confirms the above findings. IMPRESSION: 1. No evidence of aortic dissection. No evidence of aneurysmal dilatation. 2. Aortoiliac stent graft appears patent, with mild associated mural thrombus but no significant luminal narrowing. 3. Prominent focal thrombus noted at the proximal left subclavian artery, resulting in severe focal luminal narrowing. 4. Mild cardiomegaly. Diffuse interstitial prominence and haziness throughout both lungs, compatible with pulmonary edema. Small to moderate bilateral pleural effusions noted. Underlying emphysema noted. 5. Mildly enlarged subcarinal node, measuring 1.3 cm in short axis, of uncertain significance. 6. Mild narrowing at the proximal left renal artery, and mild narrowing at the origin of the celiac trunk. 7.  Mild diverticulosis along the distal descending and proximal sigmoid colon, without evidence of diverticulitis. 8. Cholelithiasis. Gallbladder otherwise unremarkable. 9. Mildly dilated common bile duct, measuring 1.1 cm in caliber. Would correlate with LFTs. 10. Small left renal cyst noted. 11. Small mildly calcified uterine fibroid. Aortic Atherosclerosis (ICD10-I70.0) and Emphysema (ICD10-J43.9). Electronically Signed   By: Garald Balding M.D.   On: 12/10/2018 19:31   US Abdomen Limited Ruq  Result Date: 12/10/2018 CLINICAL DATA:  Cholelithiasis on CT. EXAM: ULTRASOUND ABDOMEN LIMITED RIGHT UPPER QUADRANT COMPARISON:  CT 12/10/2018 FINDINGS: Gallbladder: Multiple stones and sludge layering in the gallbladder. Largest stones measure up to about 5 mm diameter. No gallbladder wall thickening or edema. Murphy's sign is negative. Common bile duct: Diameter: 6 mm, normal Liver: No focal lesion identified. Within normal limits in parenchymal echogenicity. Portal vein is patent on color Doppler imaging with normal direction of blood flow towards the liver. Incidental note of small right pleural effusion. IMPRESSION: Cholelithiasis and gallbladder sludge without additional changes to suggest cholecystitis. Right pleural effusion. Electronically Signed   By: Lucienne Capers M.D.   On: 12/10/2018 21:18    Pending Labs Unresulted Labs (From admission, onward)    Start     Ordered   12/12/18 0737  Basic metabolic panel  Daily,   STAT     12/11/18 0112   12/11/18 0009  Calcium, ionized  Once,   STAT     12/11/18 0009   12/10/18 2026  Prealbumin  Add-on,   AD    Question:  Specimen collection method  Answer:  Unit=Unit collect   12/10/18 2046          Vitals/Pain Today's Vitals   12/11/18 0545 12/11/18 0600 12/11/18 1046 12/11/18 1217  BP:  (!) 149/93  119/88  Pulse: 88 90  85  Resp: 20 (!) 25  (!)  28  Temp:    (!) 97.4 F (36.3 C)  TempSrc:    Oral  SpO2: 99% 99% 97% 100%  Weight:      Height:       PainSc:  Asleep 2      Isolation Precautions No active isolations  Medications Medications  furosemide (LASIX) injection 20 mg (20 mg Intravenous Given 12/11/18 0844)    Followed by  furosemide (LASIX) tablet 20 mg (has no administration in time range)  aspirin chewable tablet 81 mg (81 mg Oral Given 12/10/18 2214)  senna-docusate (Senokot-S) tablet 1 tablet (has no administration in time range)  bisacodyl (DULCOLAX) EC tablet 5 mg (has no administration in time range)  ondansetron (ZOFRAN) tablet 4 mg (has no administration in time range)    Or  ondansetron (ZOFRAN) injection 4 mg (has no administration in time range)  lisinopril (PRINIVIL,ZESTRIL) tablet 2.5 mg (has no administration in time range)  enoxaparin (LOVENOX) injection 40 mg (40 mg Subcutaneous Given 12/11/18 0119)  acetaminophen (TYLENOL) tablet 650 mg (has no administration in time range)    Or  acetaminophen (TYLENOL) suppository 650 mg (has no administration in time range)  atorvastatin (LIPITOR) tablet 40 mg (has no administration in time range)  carvedilol (COREG) tablet 3.125 mg (has no administration in time range)  morphine 2 MG/ML injection 2 mg (2 mg Intravenous Given 12/10/18 2225)    Or  morphine 4 MG/ML injection 4 mg ( Intravenous See Alternative 12/10/18 2225)  furosemide (LASIX) 10 MG/ML injection (has no administration in time range)  iohexol (OMNIPAQUE) 350 MG/ML injection 75 mL (75 mLs Intravenous Contrast Given 12/10/18 1852)  furosemide (LASIX) injection 80 mg (80 mg Intravenous Given 12/10/18 2228)  LORazepam (ATIVAN) injection 0.5 mg (0.5 mg Intravenous Given 12/10/18 2222)    Mobility walks with device High fall risk   Focused Assessments    R Recommendations: See Admitting Provider Note  Report given to:   Additional Notes:

## 2018-12-12 DIAGNOSIS — G301 Alzheimer's disease with late onset: Secondary | ICD-10-CM

## 2018-12-12 DIAGNOSIS — F0280 Dementia in other diseases classified elsewhere without behavioral disturbance: Secondary | ICD-10-CM

## 2018-12-12 DIAGNOSIS — J9601 Acute respiratory failure with hypoxia: Secondary | ICD-10-CM

## 2018-12-12 DIAGNOSIS — J810 Acute pulmonary edema: Secondary | ICD-10-CM

## 2018-12-12 DIAGNOSIS — I420 Dilated cardiomyopathy: Secondary | ICD-10-CM

## 2018-12-12 DIAGNOSIS — K8020 Calculus of gallbladder without cholecystitis without obstruction: Secondary | ICD-10-CM

## 2018-12-12 DIAGNOSIS — I4891 Unspecified atrial fibrillation: Secondary | ICD-10-CM

## 2018-12-12 DIAGNOSIS — I4892 Unspecified atrial flutter: Secondary | ICD-10-CM

## 2018-12-12 DIAGNOSIS — I25118 Atherosclerotic heart disease of native coronary artery with other forms of angina pectoris: Secondary | ICD-10-CM

## 2018-12-12 LAB — CBC WITH DIFFERENTIAL/PLATELET
Abs Immature Granulocytes: 0.01 10*3/uL (ref 0.00–0.07)
Basophils Absolute: 0 10*3/uL (ref 0.0–0.1)
Basophils Relative: 0 %
Eosinophils Absolute: 0.1 10*3/uL (ref 0.0–0.5)
Eosinophils Relative: 1 %
HCT: 44.5 % (ref 36.0–46.0)
Hemoglobin: 14.7 g/dL (ref 12.0–15.0)
Immature Granulocytes: 0 %
Lymphocytes Relative: 34 %
Lymphs Abs: 1.7 10*3/uL (ref 0.7–4.0)
MCH: 30.8 pg (ref 26.0–34.0)
MCHC: 33 g/dL (ref 30.0–36.0)
MCV: 93.3 fL (ref 80.0–100.0)
MONO ABS: 0.5 10*3/uL (ref 0.1–1.0)
Monocytes Relative: 9 %
Neutro Abs: 2.7 10*3/uL (ref 1.7–7.7)
Neutrophils Relative %: 56 %
Platelets: 218 10*3/uL (ref 150–400)
RBC: 4.77 MIL/uL (ref 3.87–5.11)
RDW: 13.6 % (ref 11.5–15.5)
WBC: 5 10*3/uL (ref 4.0–10.5)
nRBC: 0 % (ref 0.0–0.2)

## 2018-12-12 LAB — PROTIME-INR
INR: 1.1 (ref 0.8–1.2)
Prothrombin Time: 13.6 seconds (ref 11.4–15.2)

## 2018-12-12 LAB — TROPONIN I
Troponin I: 0.18 ng/mL (ref <0.03)
Troponin I: 0.17 ng/mL (ref <0.03)
Troponin I: 0.15 ng/mL (ref <0.03)

## 2018-12-12 LAB — BASIC METABOLIC PANEL
Anion gap: 8 (ref 5–15)
BUN: 26 mg/dL — ABNORMAL HIGH (ref 8–23)
CO2: 25 mmol/L (ref 22–32)
Calcium: 9.7 mg/dL (ref 8.9–10.3)
Chloride: 107 mmol/L (ref 98–111)
Creatinine, Ser: 1.48 mg/dL — ABNORMAL HIGH (ref 0.44–1.00)
GFR calc Af Amer: 38 mL/min — ABNORMAL LOW (ref >60)
GFR calc non Af Amer: 33 mL/min — ABNORMAL LOW (ref >60)
GLUCOSE: 110 mg/dL — AB (ref 70–99)
Potassium: 3.2 mmol/L — ABNORMAL LOW (ref 3.5–5.1)
Sodium: 140 mmol/L (ref 135–145)

## 2018-12-12 LAB — CALCIUM, IONIZED: Calcium, Ionized, Serum: 5.5 mg/dL (ref 4.5–5.6)

## 2018-12-12 LAB — MAGNESIUM: Magnesium: 2.1 mg/dL (ref 1.7–2.4)

## 2018-12-12 LAB — HEPARIN LEVEL (UNFRACTIONATED): Heparin Unfractionated: 0.2 IU/mL — ABNORMAL LOW (ref 0.30–0.70)

## 2018-12-12 LAB — TSH: TSH: 2.933 u[IU]/mL (ref 0.350–4.500)

## 2018-12-12 LAB — APTT: aPTT: 28 seconds (ref 24–36)

## 2018-12-12 LAB — CKMB (ARMC ONLY): CK, MB: 6.2 ng/mL — ABNORMAL HIGH (ref 0.5–5.0)

## 2018-12-12 LAB — BRAIN NATRIURETIC PEPTIDE: B Natriuretic Peptide: 501 pg/mL — ABNORMAL HIGH (ref 0.0–100.0)

## 2018-12-12 MED ORDER — HYDROCODONE-ACETAMINOPHEN 5-325 MG PO TABS: 1.0000 | ORAL_TABLET | Freq: Four times a day (QID) | ORAL | Status: DC | PRN

## 2018-12-12 MED ORDER — HEPARIN BOLUS VIA INFUSION
2700.0000 [IU] | Freq: Once | INTRAVENOUS | Status: AC
  Administered 2018-12-12: 2700 [IU] via INTRAVENOUS
  Filled 2018-12-12: qty 2700

## 2018-12-12 MED ORDER — LISINOPRIL 5 MG PO TABS
2.5000 mg | ORAL_TABLET | Freq: Every day | ORAL | Status: DC
  Administered 2018-12-14: 2.5 mg via ORAL
  Filled 2018-12-12: qty 1

## 2018-12-12 MED ORDER — HEPARIN (PORCINE) 25000 UT/250ML-% IV SOLN
850.0000 [IU]/h | INTRAVENOUS | Status: DC
  Administered 2018-12-12: 750 [IU]/h via INTRAVENOUS
  Administered 2018-12-13: 850 [IU]/h via INTRAVENOUS
  Filled 2018-12-12 (×2): qty 250

## 2018-12-12 MED ORDER — POTASSIUM CHLORIDE 20 MEQ PO PACK
40.0000 meq | PACK | Freq: Once | ORAL | Status: AC
  Administered 2018-12-12: 40 meq via ORAL
  Filled 2018-12-12: qty 2

## 2018-12-12 MED ORDER — SODIUM CHLORIDE 0.9 % IV BOLUS
250.0000 mL | Freq: Once | INTRAVENOUS | Status: AC
  Administered 2018-12-12: 250 mL via INTRAVENOUS

## 2018-12-12 MED ORDER — POTASSIUM CHLORIDE CRYS ER 20 MEQ PO TBCR
40.0000 meq | EXTENDED_RELEASE_TABLET | Freq: Once | ORAL | Status: AC
  Administered 2018-12-12: 40 meq via ORAL
  Filled 2018-12-12: qty 2

## 2018-12-12 MED ORDER — ENOXAPARIN SODIUM 30 MG/0.3ML ~~LOC~~ SOLN: 30.0000 mg | SUBCUTANEOUS | Status: DC

## 2018-12-12 MED ORDER — FUROSEMIDE 10 MG/ML IJ SOLN
20.0000 mg | Freq: Every day | INTRAMUSCULAR | Status: DC
  Filled 2018-12-12: qty 2

## 2018-12-12 NOTE — Consult Note (Signed)
ANTICOAGULATION CONSULT NOTE - Initial Consult  Pharmacy Consult for Heparin Drip  Indication: atrial fibrillation  Allergies  Allergen Reactions  . Tramadol Rash    Patient Measurements: Height: 5\' 4"  (162.6 cm) Weight: 117 lb 4.6 oz (53.2 kg) IBW/kg (Calculated) : 54.7   Vital Signs: Temp: 98 F (36.7 C) (03/13 0620) Temp Source: Oral (03/13 0620) BP: 109/80 (03/13 0926) Pulse Rate: 92 (03/13 0926)  Labs: Recent Labs    12/10/18 1758 12/10/18 2310 12/11/18 0200 12/12/18 0502 12/12/18 0810  HGB 13.4  --   --   --  14.7  HCT 41.6  --   --   --  44.5  PLT 182  --   --   --  218  CREATININE 1.09*  --   --  1.48*  --   CKMB  --   --   --   --  6.2*  TROPONINI 0.07* 0.11* 0.11*  --  0.18*    Estimated Creatinine Clearance: 24.6 mL/min (A) (by C-G formula based on SCr of 1.48 mg/dL (H)).   Medical History: Past Medical History:  Diagnosis Date  . AAA (abdominal aortic aneurysm) ([place])    s/p stent   . Anxiety   . Arthritis   . CAD (coronary artery disease)   . Chronic kidney disease (CKD), stage III (moderate) (HCC)   . Chronic systolic congestive heart failure (HCC)    EF of 35 %   . Dementia ([place])   . Depression   . Diverticulitis    H/O  . GERD (gastroesophageal reflux disease)   . GI bleed   . Hx of migraines   . Hx: UTI (urinary tract infection)   . Hyperlipidemia   . Hypertension   . Kidney stones   . MI (myocardial infarction) ([place])    Non-ST elevation MI  . Renal cell carcinoma   . Thyroid disease     Medications:   Scheduled:  . aspirin  81 mg Oral Daily  . atorvastatin  40 mg Oral q1800  . carvedilol  3.125 mg Oral BID WC  . [START ON 12/13/2018] furosemide  20 mg Intravenous Daily  . [START ON 12/14/2018] lisinopril  2.5 mg Oral Daily   Infusions:    Assessment: Pharmacy consulted for Heparin dosing and monitoring in 85 yo female with new onset Afib. Patient has low EF and is at a high risk for stroke.   Goal of Therapy:  HL  level: 0.3-0.7 Monitor platelets by anticoagulation protocol: Yes   Plan:  Baseline labs ordered. Give 2700 units bolus x 1 Start heparin infusion at 750 units/hr Check anti-Xa level in 8 hours and daily while on heparin Continue to monitor H&H and platelets   Cadence Haslam A Asti Mackley 12/12/2018,1:14 PM

## 2018-12-12 NOTE — TOC Initial Note (Signed)
Transition of Care [facility]) - Initial/Assessment Note    Patient Details  Name: Karen Collins MRN: [id number] Date of Birth: 04/21/1934  Transition of Care [facility]) CM/SW Contact:    Shelbie Hutching, RN Phone Number: 12/12/2018, 2:54 PM  Clinical Narrative:                 Patient is from home and lives alone, she has 2 sisters that are local that are able to help her, they provide transportation for her.  Patient has not been able to drive since her heart attack a couple of years ago.  Patient is not set up with the heart failure clinic.  Patient is interested in home health with heart failure protocol after discharge.  CMS approved home health agency list given to patient for her to choose.  At base line patient is independent and requires no assistive devices.  Patient is current with her PCP [name].  Expected Discharge Plan: [place]     Patient Goals and CMS Choice Patient states their goals for this hospitalization and ongoing recovery are:: Wants to go home when she feels better CMS Medicare.gov Compare Post Acute Care list provided to:: Patient Choice offered to / list presented to : Patient  Expected Discharge Plan and Services Expected Discharge Plan: [place] Discharge Planning Services: CM Consult Post Acute Care Choice: [place] arrangements for the past 2 months: Single Family Home                          Prior Living Arrangements/Services Living arrangements for the past 2 months: Single Family Home Lives with:: Self   Do you feel safe going back to the place where you live?: Yes      Need for Family Participation in Patient Care: Yes (Comment) Care giver support system in place?: Yes (comment)      Activities of Daily Living Home Assistive Devices/Equipment: None ADL Screening (condition at time of admission) Patient's cognitive ability adequate to safely complete daily activities?: Yes Is the  patient deaf or have difficulty hearing?: Yes Does the patient have difficulty seeing, even when wearing glasses/contacts?: No Does the patient have difficulty concentrating, remembering, or making decisions?: No Patient able to express need for assistance with ADLs?: Yes Does the patient have difficulty dressing or bathing?: No Independently performs ADLs?: Yes (appropriate for developmental age) Does the patient have difficulty walking or climbing stairs?: No Weakness of Legs: None Weakness of Arms/Hands: None  Permission Sought/Granted Permission sought to share information with : Case Manager, Other (comment) Permission granted to share information with : Yes, Verbal Permission Granted     Permission granted to share info w AGENCY: [facility] of choice        Emotional Assessment Appearance:: Appears stated age Attitude/Demeanor/Rapport: Engaged Affect (typically observed): Accepting Orientation: : Oriented to Self, Oriented to Place, Oriented to  Time, Oriented to Situation Alcohol / Substance Use: Not Applicable Psych Involvement: No (comment)  Admission diagnosis:  Acute respiratory failure with hypoxia ([place]) [J96.01] Cholelithiasis [K80.20] Acute on chronic congestive heart failure, unspecified heart failure type [facility]) [I50.9] Patient Active Problem List   Diagnosis Date Noted  . Acute hypoxemic respiratory failure ([place]) 12/10/2018  . MDD (major depressive disorder), recurrent episode, moderate ([place]) 01/12/2018  . Compliance with medication regimen 07/21/2017  . Chronic systolic heart failure ([place]) 05/25/2016  . HTN (hypertension) 05/25/2016  . Hypokalemia 05/10/2016  .  Hypocalcemia 05/10/2016  . Health care maintenance 11/15/2014  . Urinary frequency 07/11/2014  . Leukopenia 07/11/2014  . Sleeping difficulty 03/18/2014  . Anxiety 07/22/2013  . AAA (abdominal aortic aneurysm) ([place]) 06/01/2013  . History of renal cell cancer 06/01/2013  .  Hyperparathyroidism ([place]) 06/01/2013  . CKD (chronic kidney disease), stage III ([place]) 06/01/2013  . Memory change 06/01/2013  . History of GI bleed 06/01/2013  . Anemia, iron deficiency 06/01/2013  . Portal vein aneurysm 06/01/2013  . ISCHEMIC CARDIOMYOPATHY 11/27/2010  . CAROTID BRUIT, LEFT 11/09/2010  . Hypercholesterolemia 11/08/2010  . Coronary atherosclerosis 11/08/2010  . Carotid stenosis 11/08/2010  . GI BLEEDING 11/08/2010   PCP:  [name], MD Pharmacy:   [id number] - [place], [place] - [address] [place] [address] Phone: [phone] Fax: [phone]     Social Determinants of Health (SDOH) Interventions    Readmission Risk Interventions 30 Day Unplanned Readmission Risk Score     ED to Hosp-Admission (Current) from 12/10/2018 in [place] (2A)  30 Day Unplanned Readmission Risk Score (%)  15 Filed at 12/12/2018 1200     This score is the patient's risk of an unplanned readmission within 30 days of being discharged (0 -100%). The score is based on dignosis, age, lab data, medications, orders, and past utilization.   Low:  0-14.9   Medium: 15-21.9   High: 22-29.9   Extreme: 30 and above       No flowsheet data found.

## 2018-12-12 NOTE — TOC Progression Note (Signed)
Transition of Care [facility]) - Progression Note    Patient Details  Name: Karen Collins MRN: [id number] Date of Birth: 04-Apr-1934  Transition of Care [facility]) CM/SW Contact  Elza Rafter, RN Phone Number: 12/12/2018, 3:26 PM  Clinical Narrative:   Late entry from 0900-Spoke with patient about home health agencies.  As patient has an EF <20% this RNCM explained the ReDS Vest and patient was very interested and decided to chose [facility].  Would benefit from RN, SW, and ReDS Vest.  Referral made to [place]. Patient does have short term memory issues.  She will ask the same question every minute or so.  Medication compliance concerns.  Daughter is coming in from [place] this evening.  Brother in law at bedside and stated that she has a care giver that comes and assists patient here and there.  Family may look into increased PCS.     Expected Discharge Plan: [place]    Expected Discharge Plan and Services Expected Discharge Plan: [place] Discharge Planning Services: CM Consult Post Acute Care Choice: [place] arrangements for the past 2 months: Single Family Home                     HH Arranged: RN, PT, Social Work     Social Determinants of Health (SDOH) Interventions    Readmission Risk Interventions 30 Day Unplanned Readmission Risk Score     ED to Hosp-Admission (Current) from 12/10/2018 in [place] (2A)  30 Day Unplanned Readmission Risk Score (%)  15 Filed at 12/12/2018 1200     This score is the patient's risk of an unplanned readmission within 30 days of being discharged (0 -100%). The score is based on dignosis, age, lab data, medications, orders, and past utilization.   Low:  0-14.9   Medium: 15-21.9   High: 22-29.9   Extreme: 30 and above       No flowsheet data found.

## 2018-12-12 NOTE — Progress Notes (Addendum)
CCMD called and reported that pt change to SR to a-fib/a-flutter. On assessment pt was asymtomatic. VSS except BP of 92/65. Page prime. Will continue to monitor.  Update 0441: Talked to Doctor Mansy and ordered to do STAT EKG, TSH, potassium and mag level and do a one time bolus of 250 normal saline. Will continue to monitor.  Update 0537: Dr. Sidney Ace was notified of EKG resulted. BMP was not resulted yet. Will continue to monitor.  Update 0600: Dr. Sidney Ace ordered Troponin every 6 hours for 3 occurrences,  CKMB STAT, and ECHO. Also ordered that if potassium < 4 to give 40 mEq potassium oral once and if mag < 2 to give 2 gram of IV mag sulfate once. Will notify incoming shift. Will continue to monitor.  Update 0659: Pt potassium was at 3.2 and Mag of 2.1. Place an order for one time 40 mEq oral one time. Will continue to monitor.

## 2018-12-12 NOTE — Care Management Important Message (Signed)
Important Message  Patient Details  Name: Karen Collins MRN: [id number] Date of Birth: 1934/06/05   Medicare Important Message Given:  Yes    Dannette Barbara 12/12/2018, 10:19 AM

## 2018-12-12 NOTE — Evaluation (Signed)
Physical Therapy Evaluation Patient Details Name: Karen Collins MRN: [id number] DOB: 09/27/1934 Today's Date: 12/12/2018   History of Present Illness  85 year old female with multiple medical problems including hypertension, CAD, hypothyroidism, depression anxiety, AAA, CKD, CHF and dementia presents to hospital secondary to abdominal pain. Admitted with respiratory failure and is being treated for new onset Afib.  Clinical Impression  Pt did well with PT exam and showed good overall confidence with prolonged bout of ambulation (~200 ft) w/o AD.  Her biggest issue today was HR changes and fluctuations during the effort (from 90s at rest up to 140s with ambulation, typically staying in the 110s.)  Pt pleasantly confused t/o the session, but was able to give some seemingly reliable background info and hold appropriate (though at times repeated) conversation.  Pt states that she is home alone most of the time but that family or friends do check in daily.  If this is the case she should be able to return home with HHPT once medically ready for d/c.    Follow Up Recommendations Home health PT;Supervision - Intermittent    Equipment Recommendations  None recommended by PT    Recommendations for Other Services       Precautions / Restrictions Precautions Precautions: Fall Restrictions Weight Bearing Restrictions: No      Mobility  Bed Mobility Overal bed mobility: Independent             General bed mobility comments: Pt easily transitions from supine to sitting w/o assist  Transfers Overall transfer level: Independent Equipment used: None             General transfer comment: Pt able to rise w/o assist, showed good confidence  Ambulation/Gait Ambulation/Gait assistance: Modified independent (Device/Increase time) Gait Distance (Feet): 200 Feet Assistive device: None       General Gait Details: Pt on room air t/o the effort, sats stay in the mid 90s.  HR however  was labile and ranged from ~100 to as high as 140 at some points.  Pt fatigued after the effort, but did not have any LOBs or overt safety issues during prologed walk   Stairs            Wheelchair Mobility    Modified Rankin (Stroke Patients Only)       Balance Overall balance assessment: Modified Independent                                           Pertinent Vitals/Pain Pain Assessment: No/denies pain(has been having some abdominal discomfort, not now)    Home Living Family/patient expects to be discharged to:: Private residence Living Arrangements: Alone Available Help at Discharge: Family;Friend(s);Neighbor(Pt reports a good network that can help her)   Home Access: Stairs to enter Entrance Stairs-Rails: Left Entrance Stairs-Number of Steps: 3 Home Layout: One level Home Equipment: (per pt none)      Prior Function Level of Independence: Independent         Comments: Pt does not drive, out of the home minimally but reports that she manages around the home well with family and neighbors assisting     Hand Dominance        Extremity/Trunk Assessment   Upper Extremity Assessment Upper Extremity Assessment: Overall WFL for tasks assessed;Generalized weakness    Lower Extremity Assessment Lower Extremity Assessment: Overall WFL for tasks assessed;Generalized weakness  Communication   Communication: No difficulties  Cognition Arousal/Alertness: Awake/alert Behavior During Therapy: WFL for tasks assessed/performed Overall Cognitive Status: History of cognitive impairments - at baseline                                 General Comments: pt does not know date, repeats herself t/o session      General Comments      Exercises     Assessment/Plan    PT Assessment Patient needs continued PT services  PT Problem List Decreased strength;Decreased balance;Decreased activity tolerance;Decreased mobility;Decreased  cognition;Decreased safety awareness;Cardiopulmonary status limiting activity       PT Treatment Interventions DME instruction;Gait training;Stair training;Functional mobility training;Therapeutic activities;Therapeutic exercise;Neuromuscular re-education;Balance training;Cognitive remediation;Patient/family education    PT Goals (Current goals can be found in the Care Plan section)  Acute Rehab PT Goals Patient Stated Goal: go home PT Goal Formulation: With patient Time For Goal Achievement: 12/26/18 Potential to Achieve Goals: Good    Frequency Min 2X/week   Barriers to discharge        Co-evaluation               AM-PAC PT "6 Clicks" Mobility  Outcome Measure Help needed turning from your back to your side while in a flat bed without using bedrails?: None Help needed moving from lying on your back to sitting on the side of a flat bed without using bedrails?: None Help needed moving to and from a bed to a chair (including a wheelchair)?: None Help needed standing up from a chair using your arms (e.g., wheelchair or bedside chair)?: None Help needed to walk in hospital room?: None Help needed climbing 3-5 steps with a railing? : A Little 6 Click Score: 23    End of Session Equipment Utilized During Treatment: Gait belt Activity Tolerance: Patient limited by fatigue;Patient tolerated treatment well Patient left: with chair alarm set;with call bell/phone within reach Nurse Communication: Mobility status(vitals during ambulation) PT Visit Diagnosis: Muscle weakness (generalized) (M62.81);Difficulty in walking, not elsewhere classified (R26.2)    Time: 0017-4944 PT Time Calculation (min) (ACUTE ONLY): 18 min   Charges:   PT Evaluation $PT Eval Low Complexity: 1 Low          Kreg Shropshire, DPT 12/12/2018, 3:52 PM

## 2018-12-12 NOTE — Plan of Care (Signed)
  Problem: Activity: Goal: Risk for activity intolerance will decrease Outcome: Progressing   Problem: Safety: Goal: Ability to remain free from injury will improve Outcome: Progressing   

## 2018-12-12 NOTE — Plan of Care (Signed)
°  Problem: Clinical Measurements: °Goal: Respiratory complications will improve °Outcome: Progressing °  °Problem: Pain Managment: °Goal: General experience of comfort will improve °Outcome: Progressing °  °Problem: Safety: °Goal: Ability to remain free from injury will improve °Outcome: Progressing °  °

## 2018-12-12 NOTE — Consult Note (Addendum)
Cardiology Consultation:   Patient ID: Karen Collins MRN: [id number]; DOB: [birth year]  Admit date: 12/10/2018 Date of Consult: 12/12/2018  Primary Care Provider: [name], MD Primary Cardiologist: [name], [facility] Primary Electrophysiologist:  None    Patient Profile:   Karen Collins is a 85 y.o. female with a hx of CAD with NSTEMI in 08/2010 and 03/2012 with occlusion of the left circumflex status post PCI/overlapping stents with residual stenosis, ICM/chronic systolic CHF with EF (6599) of 35% and thought to be out of proportion to CAD, AAA s/p stenting, V malformations of the colon, prior GI bleed requiring transfusion that persisted requiring readmission for GI bleed, CKD, cancer, memory issues / dementia, and anxiety who is being seen today for the evaluation of acute on chronic CHF at the request of Dr. Tressia Miners.  History of Present Illness:   Karen Collins is an 85 year old female with PMH as above.  She is status post cardiac cath 03/2012 for NSTEMI with cath showing an occluded mid left circumflex at previously placed stents with very good collaterals, 60% mid RCA stenosis, unchanged from before and EF 35%.  Medical management was advised at that time.   Previously followed by [name] with last visit 08/2012 and for routine follow-up of CAD.  Per review of medical records, she has not seen a cardiologist since that time other than her consultation by Dr. Fletcher Anon when admitted to [facility] 05/2016 for SOB likely 2/2 acute on chronic CHF.  At that time, echo showed LVEF 35 to 40%, left ventricle mildly dilated, mild concentric hypertrophy, akinesis of the inferolateral and inferior myocardium, G1 DD.  Per 11/2016 bubble study, a trivial pericardial effusion was noted.  As patient has not seen a cardiologist since that time and is declining in mental status, it does not appear that she has had further workup or cardiac management since that time.  On 12/10/2018, the patient  presented to [facility] ED with complaint of acute onset abdominal pain that started same day and was associated with shortness of breath.  The abdominal pain was described as located in the bilateral lower abdominal region.  She denied any nausea, emesis, or changes in urinary or bowel status.  EKG showed normal sinus rhythm, rate 94, no specific change when compared to EKG 11/2017.  Chest x-ray showed cardiomegaly with pulmonary edema.  CT angiogram of the chest/abdomen/pelvis showed cholelithiasis. Vitals significant for hypoxia and hypotension.   Past Medical History:  Diagnosis Date   AAA (abdominal aortic aneurysm) (HCC)    s/p stent    Anxiety    Arthritis    CAD (coronary artery disease)    Chronic kidney disease (CKD), stage III (moderate) (HCC)    Chronic systolic congestive heart failure (HCC)    EF of 35 %    Dementia (HCC)    Depression    Diverticulitis    H/O   GERD (gastroesophageal reflux disease)    GI bleed    Hx of migraines    Hx: UTI (urinary tract infection)    Hyperlipidemia    Hypertension    Kidney stones    MI (myocardial infarction) ([place])    Non-ST elevation MI   Renal cell carcinoma    Thyroid disease     Past Surgical History:  Procedure Laterality Date   ABDOMINAL AORTIC ANEURYSM REPAIR     s/p stent   ABDOMINAL AORTIC ENDOVASCULAR STENT GRAFT     CARDIAC CATHETERIZATION     CARDIAC CATHETERIZATION  09/18/2010  CHOLECYSTECTOMY     CORONARY STENT PLACEMENT  09/18/2010   CAD; MI s/p stent   LAPAROSCOPIC PARTIAL NEPHRECTOMY  2010   NEPHRECTOMY  10/2008   Right   PARATHYROIDECTOMY     SHOULDER SURGERY  07/2010   Right   THYROIDECTOMY       Home Medications:  Prior to Admission medications   Medication Sig Start Date End Date Taking? Authorizing Provider  atorvastatin (LIPITOR) 40 MG tablet Take 1 tablet (40 mg total) by mouth daily at 6 PM. 07/11/18  Yes [name], MD  carvedilol (COREG) 3.125 MG tablet TAKE  ONE TABLET TWICE DAILY WITH A MEAL 07/11/18  Yes [name], MD    Inpatient Medications: Scheduled Meds:  aspirin  81 mg Oral Daily   atorvastatin  40 mg Oral q1800   carvedilol  3.125 mg Oral BID WC   enoxaparin (LOVENOX) injection  30 mg Subcutaneous Q24H   [START ON 12/13/2018] furosemide  20 mg Intravenous Daily   lisinopril  2.5 mg Oral Daily   Continuous Infusions:  PRN Meds: acetaminophen **OR** acetaminophen, bisacodyl, morphine injection **OR** morphine injection, ondansetron **OR** ondansetron (ZOFRAN) IV, senna-docusate  Allergies:    Allergies  Allergen Reactions   Tramadol Rash    Social History:   Social History   Socioeconomic History   Marital status: Divorced    Spouse name: Not on file   Number of children: Not on file   Years of education: Not on file   Highest education level: Not on file  Occupational History   Occupation: Retired    Fish farm manager: RETIRED  Scientist, product/process development strain: Not on file   Food insecurity:    Worry: Not on file    Inability: Not on file   Transportation needs:    Medical: Not on file    Non-medical: Not on file  Tobacco Use   Smoking status: Former Smoker    Packs/day: 1.00    Years: 30.00    Pack years: 30.00    Last attempt to quit: 10/01/2005    Years since quitting: 13.2   Smokeless tobacco: Never Used  Substance and Sexual Activity   Alcohol use: Yes    Alcohol/week: 7.0 standard drinks    Types: 4 Glasses of wine, 3 Standard drinks or equivalent per week    Comment: Wine 3-4 times a week    Drug use: No   Sexual activity: Never  Lifestyle   Physical activity:    Days per week: Not on file    Minutes per session: Not on file   Stress: Not on file  Relationships   Social connections:    Talks on phone: Not on file    Gets together: Not on file    Attends religious service: Not on file    Active member of club or organization: Not on file    Attends meetings of  clubs or organizations: Not on file    Relationship status: Not on file   Intimate partner violence:    Fear of current or ex partner: Not on file    Emotionally abused: Not on file    Physically abused: Not on file    Forced sexual activity: Not on file  Other Topics Concern   Not on file  Social History Narrative   Married   Walks occasionally with house work    Family History:    Family History  Problem Relation Age of Onset   Arthritis Mother  Heart disease Mother    Pulmonary fibrosis Mother    Heart disease Father    Hodgkin's lymphoma Father    Breast cancer Sister    Hyperlipidemia Sister      ROS:  Please see the history of present illness.   Review of Systems  Unable to perform ROS: Dementia  Constitutional: Negative for malaise/fatigue.       "I feel fine"  Respiratory: Positive for cough and shortness of breath. Negative for hemoptysis.   Gastrointestinal: Negative for abdominal pain.       Denies abdominal pain today and not TTP  Genitourinary: Negative for flank pain.  Neurological: Negative for weakness.  Psychiatric/Behavioral: Positive for memory loss.       "I don't remember why I am here, to be honest with you."  All other systems reviewed and are negative.   All other ROS reviewed and negative.     Physical Exam/Data:   Vitals:   12/12/18 0419 12/12/18 0421 12/12/18 0620 12/12/18 0926  BP: (!) 89/69 92/65 105/72 109/80  Pulse: 85 86 82 92  Resp:   18 18  Temp:   98 F (36.7 C)   TempSrc:   Oral   SpO2: 95% 93% 96% 99%  Weight:   53.2 kg   Height:        Intake/Output Summary (Last 24 hours) at 12/12/2018 1012 Last data filed at 12/12/2018 5366 Gross per 24 hour  Intake 120 ml  Output 1250 ml  Net -1130 ml   Filed Weights   12/10/18 1748 12/12/18 0620  Weight: 60.6 kg 53.2 kg   Body mass index is 20.13 kg/m.  General:  Frail and elderly female, cachetic   HEENT: on [place] oxygen Neck: JVD not assessed  Vascular: Radial  pulses 2+ bilaterally   Cardiac:  IRIR; tachycardic rate Lungs:  Bibasilar reduced breath sounds, bibasilar crackles Abd: soft, nontender, no hepatomegaly  Ext: no bilateral lower extremity edema Musculoskeletal:  No deformities Skin: warm and dry  Neuro:  no focal abnormalities noted Psych:  Normal affect   EKG: Refer to HPI Telemetry:  Telemetry was personally reviewed and demonstrates:  Aflutter and atrial fibrillation with ventricular rate low 100s / 110s  CV Studies:   Relevant CV Studies: Pending updated echo  Bubble study  11/2016 Left ventricle:  The cavity size was normal. Wall thickness was increased in a pattern of mild LVH. Systolic function was moderately to severely reduced. The estimated ejection fraction was in the range of 30% to 35%.  Regional wall motion abnormalities: There is akinesis of the inferolateral and inferior myocardium. The study is not technically sufficient to allow evaluation of LV diastolic function.  TTE 05/2016 Left ventricle: The cavity size was mildly dilated. There was   mild concentric hypertrophy. Systolic function was moderately   reduced. The estimated ejection fraction was in the range of 35%   to 40%. There is akinesis of the inferolateral and inferior   myocardium. Doppler parameters are consistent with abnormal left   ventricular relaxation (grade 1 diastolic dysfunction). - Mitral valve: There was mild to moderate regurgitation. - Left atrium: The atrium was moderately dilated  Laboratory Data:  Chemistry Recent Labs  Lab 12/10/18 1758 12/12/18 0502  NA 134* 140  K 4.1 3.2*  CL 104 107  CO2 21* 25  GLUCOSE 113* 110*  BUN 15 26*  CREATININE 1.09* 1.48*  CALCIUM 9.6 9.7  GFRNONAA 47* 33*  GFRAA 55* 38*  ANIONGAP 9 8  Recent Labs  Lab 12/10/18 1758  PROT 5.8*  ALBUMIN 3.8  AST 59*  ALT 49*  ALKPHOS 71  BILITOT 1.2   Hematology Recent Labs  Lab 12/10/18 1758 12/12/18 0810  WBC 3.7* 5.0  RBC 4.42  4.77  HGB 13.4 14.7  HCT 41.6 44.5  MCV 94.1 93.3  MCH 30.3 30.8  MCHC 32.2 33.0  RDW 13.3 13.6  PLT 182 218   Cardiac Enzymes Recent Labs  Lab 12/10/18 1758 12/10/18 2310 12/11/18 0200 12/12/18 0810  TROPONINI 0.07* 0.11* 0.11* 0.18*   No results for input(s): TROPIPOC in the last 168 hours.  BNP Recent Labs  Lab 12/10/18 1758 12/12/18 0810  BNP 1,591.0* 501.0*    DDimer No results for input(s): DDIMER in the last 168 hours.  Radiology/Studies:  Ct Head Wo Contrast  Result Date: 12/11/2018 CLINICAL DATA:  85 year old female with altered mental status. EXAM: CT HEAD WITHOUT CONTRAST TECHNIQUE: Contiguous axial images were obtained from the base of the skull through the vertex without intravenous contrast. COMPARISON:  12/11/2016 MR and prior studies FINDINGS: Brain: No evidence of acute infarction, hemorrhage, hydrocephalus, extra-axial collection or mass lesion/mass effect. Atrophy and chronic small-vessel white matter ischemic changes again noted. Vascular: Atherosclerotic calcifications noted. Skull: Normal. Negative for fracture or focal lesion. Sinuses/Orbits: No acute finding. Other: None. IMPRESSION: 1. No evidence of acute intracranial abnormality 2. Atrophy and chronic small-vessel white matter ischemic changes. Electronically Signed   By: Margarette Canada M.D.   On: 12/11/2018 15:19   Dg Chest Portable 1 View  Result Date: 12/10/2018 CLINICAL DATA:  Dyspnea, history of renal cancer EXAM: PORTABLE CHEST 1 VIEW COMPARISON:  Chest CT 12/10/2018 FINDINGS: Cardiomegaly with nonaneurysmal atherosclerotic aorta is noted. Diffuse mild-to-moderate interstitial edema is noted as noted same day chest CT however the bilateral pleural effusions are less apparent radiographically. Partially visualized abdominal aortic stent graft noted. Acute nor aggressive osseous abnormality IMPRESSION: Cardiomegaly with mild-to-moderate pulmonary edema, similar in appearance to prior earlier chest CT.  Electronically Signed   By: Ashley Royalty M.D.   On: 12/10/2018 19:48   Ct Angio Chest/abd/pel For Dissection W And/or Wo Contrast  Result Date: 12/10/2018 CLINICAL DATA:  Acute onset of generalized abdominal pain and difficulty breathing. EXAM: CT ANGIOGRAPHY CHEST, ABDOMEN AND PELVIS TECHNIQUE: Multidetector CT imaging through the chest, abdomen and pelvis was performed using the standard protocol during bolus administration of intravenous contrast. Multiplanar reconstructed images and MIPs were obtained and reviewed to evaluate the vascular anatomy. CONTRAST:  52mL OMNIPAQUE IOHEXOL 350 MG/ML SOLN COMPARISON:  None. FINDINGS: CTA CHEST FINDINGS Cardiovascular: There is no evidence of aortic dissection. There is no evidence of aneurysmal dilatation. Scattered calcification is noted along the thoracic aorta and proximal great vessels, with mild luminal narrowing at the left carotid artery, and a prominent focal thrombus resulting in severe luminal narrowing at the proximal left subclavian artery. The heart is mildly enlarged. Mediastinum/Nodes: A mildly enlarged subcarinal node is noted, measuring 1.3 cm in short axis. No additional mediastinal lymphadenopathy is seen. No pericardial effusion is identified. The visualized portions of the thyroid gland are unremarkable. No axillary lymphadenopathy is seen. Lungs/Pleura: Small to moderate bilateral pleural effusions are noted. Diffuse interstitial prominence and haziness is noted throughout both lungs, compatible with pulmonary edema. Underlying emphysema is noted. No pneumothorax is seen. No masses are identified. Musculoskeletal: No acute osseous abnormalities are identified. The patient's right shoulder arthroplasty is only partially imaged on this study. The visualized musculature is unremarkable in appearance. Review  of the MIP images confirms the above findings. CTA ABDOMEN AND PELVIS FINDINGS VASCULAR Aorta: Scattered calcification is noted along the  abdominal aorta, with mild irregularity of the aortic wall, but no definite penetrating aortic ulceration. The patient's aortoiliac stent graft appears patent, with mild associated mural thrombus but no significant luminal narrowing. No significant aneurysmal dilatation is seen. There is no evidence of aortic dissection at this time. Celiac: There is mild focal narrowing at the origin of the celiac trunk. SMA: There is minimal narrowing at the proximal superior mesenteric artery, with associated calcification. Renals: The patient is status post right-sided nephrectomy. There is mild narrowing at the proximal left renal artery, with associated calcification. IMA: The inferior mesenteric artery is not definitely seen. Inflow: The internal and external iliac arteries appear grossly intact. The visualized portions of the common femoral arteries and their branches are unremarkable. Veins: Visualized venous structures are unremarkable in appearance. Review of the MIP images confirms the above findings. NON-VASCULAR Hepatobiliary: The liver is unremarkable in appearance. Small layering stones are noted at the gallbladder. The gallbladder is otherwise unremarkable. The common bile duct is mildly dilated, measuring 1.1 cm in caliber. Pancreas: The pancreas is within normal limits. An apparent cystic focus of the head of the pancreas is thought to reflect normal duct structures. Spleen: The spleen is unremarkable in appearance. Adrenals/Urinary Tract: The adrenal glands are unremarkable in appearance. The patient is status post right-sided nephrectomy. A small left renal cyst is noted. There is no evidence of hydronephrosis. No renal or ureteral stones are identified. No perinephric stranding is seen. Stomach/Bowel: The stomach is unremarkable in appearance. The small bowel is within normal limits. The appendix is not visualized; there is no evidence for appendicitis. Mild diverticulosis is noted along the distal descending  and proximal sigmoid colon, without evidence of diverticulitis. Lymphatic: No retroperitoneal or pelvic sidewall lymphadenopathy is seen. Reproductive: The bladder is moderately distended and grossly unremarkable. A small mildly calcified uterine fibroid is noted. No suspicious adnexal masses are seen. Other: An anterior abdominal wall mesh is noted. Musculoskeletal: No acute osseous abnormalities are identified. There is grade 2 anterolisthesis of L4 on L5, with associated vacuum phenomenon and underlying facet disease. The visualized musculature is unremarkable in appearance. Review of the MIP images confirms the above findings. IMPRESSION: 1. No evidence of aortic dissection. No evidence of aneurysmal dilatation. 2. Aortoiliac stent graft appears patent, with mild associated mural thrombus but no significant luminal narrowing. 3. Prominent focal thrombus noted at the proximal left subclavian artery, resulting in severe focal luminal narrowing. 4. Mild cardiomegaly. Diffuse interstitial prominence and haziness throughout both lungs, compatible with pulmonary edema. Small to moderate bilateral pleural effusions noted. Underlying emphysema noted. 5. Mildly enlarged subcarinal node, measuring 1.3 cm in short axis, of uncertain significance. 6. Mild narrowing at the proximal left renal artery, and mild narrowing at the origin of the celiac trunk. 7. Mild diverticulosis along the distal descending and proximal sigmoid colon, without evidence of diverticulitis. 8. Cholelithiasis. Gallbladder otherwise unremarkable. 9. Mildly dilated common bile duct, measuring 1.1 cm in caliber. Would correlate with LFTs. 10. Small left renal cyst noted. 11. Small mildly calcified uterine fibroid. Aortic Atherosclerosis (ICD10-I70.0) and Emphysema (ICD10-J43.9). Electronically Signed   By: Garald Balding M.D.   On: 12/10/2018 19:31   US Abdomen Limited Ruq  Result Date: 12/10/2018 CLINICAL DATA:  Cholelithiasis on CT. EXAM:  ULTRASOUND ABDOMEN LIMITED RIGHT UPPER QUADRANT COMPARISON:  CT 12/10/2018 FINDINGS: Gallbladder: Multiple stones and sludge layering in  the gallbladder. Largest stones measure up to about 5 mm diameter. No gallbladder wall thickening or edema. Murphy's sign is negative. Common bile duct: Diameter: 6 mm, normal Liver: No focal lesion identified. Within normal limits in parenchymal echogenicity. Portal vein is patent on color Doppler imaging with normal direction of blood flow towards the liver. Incidental note of small right pleural effusion. IMPRESSION: Cholelithiasis and gallbladder sludge without additional changes to suggest cholecystitis. Right pleural effusion. Electronically Signed   By: Lucienne Capers M.D.   On: 12/10/2018 21:18    Assessment and Plan:   Acute on chronic systolic CHF - Continue to monitor I/O, daily weights, daily BMET. Underwent IV diuresis since admission in setting of pulmonary edema and bilateral pleural effusions and with significant urine output and decrease in BNP from 1591.0  501.0 (3/11-3/13). Patient reports continued SOB and c/o new cough today (3/13). Continue O2. Cr increase overnight, hypotensive  / tachycardic today, and not terribly volume overloaded at exam. Given increase in Cr and slowed urine output, we decreased IV lasix this AM. Recheck renal function tomorrow AM; monitor BP closely to prevent pre-renal injury. Consider transition to po lasix. Pending updated echo with previous echo showing EF reduced to ~35%. Continue Coreg, statin, lisinopril as renal function and BP allow and with consideration of renal function and BP.   New onset atrial flutter / atrial fibrillation - Would not recommend anticoagulation given mental status and concern regarding fall (has current Life-Alert and lived at home prior to this admission), as well as previous history of GI bleed. Plan for rate control only at this time. Escalation of BB currently limited by softer Bps /  hypotension. Could consider digoxin given HF with Afib if renal function allows and if rates continue to be difficult to control with BB and in setting of reduced EF.    Elevated troponin - Troponin elevated 0.03  0.11  0.18. Continue to cycle until peaked and down-trending. Likely supply demand ischemia in setting of new onset atrial flutter with uncontrolled ventricular rate and reduced EF to 35% (pending updated echo as above). Not able to properly assess cardiac complaints of chest pain, palpitations, or SOB d/t reduced mental status with dementia. Family not at bedside. No further ischemic workup or plan for cardiac catheterization at this time.   Dementia - Per review of EMR, progressive memory issues. No family at bedside. Will need long discussion with family regarding cares of goal, etc. Previously lived at home and will likely need to assisted living facility / SNF as concern regarding fall and medication management at this time.  Lower abdominal pain - No current abdominal pain today. Further workup regarding etiology per IM. Gallstones noted on CT. Tolerating diet well.   For questions or updates, please contact [place] Please consult [email or web address] for contact info under     Signed, Arvil Chaco, PA-C  12/12/2018 10:12 AM

## 2018-12-12 NOTE — Progress Notes (Signed)
[facility] NAME: Karen Collins    MR#:  [id number]  DATE OF BIRTH:  04-27-1934  SUBJECTIVE:  CHIEF COMPLAINT:   Chief Complaint  Patient presents with   Abdominal Pain   - very confused, likely dementia at baseline - new onset afib last night- starting on heparin drip - breathing is better- on 2-3L o2- acute o2 needs  REVIEW OF SYSTEMS:  Review of Systems  Constitutional: Positive for malaise/fatigue. Negative for chills and fever.  HENT: Negative for congestion, ear discharge, hearing loss and nosebleeds.   Eyes: Negative for blurred vision and double vision.  Respiratory: Negative for cough, shortness of breath and wheezing.   Cardiovascular: Negative for chest pain, palpitations and leg swelling.  Gastrointestinal: Negative for abdominal pain, constipation, diarrhea, nausea and vomiting.  Genitourinary: Negative for dysuria.  Musculoskeletal: Negative for myalgias.  Neurological: Negative for dizziness, focal weakness, seizures, weakness and headaches.  Psychiatric/Behavioral: Negative for depression.    DRUG ALLERGIES:   Allergies  Allergen Reactions   Tramadol Rash    VITALS:  Blood pressure 109/80, pulse 92, temperature 98 F (36.7 C), temperature source Oral, resp. rate 18, height 5\' 4"  (1.626 m), weight 53.2 kg, SpO2 99 %.  PHYSICAL EXAMINATION:  Physical Exam   GENERAL:  85 y.o.-year-old patient lying in the bed with no acute distress.  EYES: Pupils equal, round, reactive to light and accommodation. No scleral icterus. Extraocular muscles intact.  HEENT: Head atraumatic, normocephalic. Oropharynx and nasopharynx clear.  NECK:  Supple, no jugular venous distention. No thyroid enlargement, no tenderness.  LUNGS: Normal breath sounds bilaterally, no wheezing, rales,rhonchi or crepitation. No use of accessory muscles of respiration.  Decreased bibasilar breath sounds CARDIOVASCULAR: S1, S2 normal. No   rubs, or gallops.  2/6 systolic murmur is present ABDOMEN: Soft, nontender, nondistended. Bowel sounds present. No organomegaly or mass.  EXTREMITIES: No pedal edema, cyanosis, or clubbing.  NEUROLOGIC: Cranial nerves II through XII are intact. Muscle strength 5/5 in all extremities. Sensation intact. Gait not checked.  Overall weakness noted PSYCHIATRIC: The patient is alert and oriented x self and person.  Impulsive SKIN: No obvious rash, lesion, or ulcer.    LABORATORY PANEL:   CBC Recent Labs  Lab 12/12/18 0810  WBC 5.0  HGB 14.7  HCT 44.5  PLT 218   ------------------------------------------------------------------------------------------------------------------  Chemistries  Recent Labs  Lab 12/10/18 1758  12/12/18 0502  NA 134*  --  140  K 4.1  --  3.2*  CL 104  --  107  CO2 21*  --  25  GLUCOSE 113*  --  110*  BUN 15  --  26*  CREATININE 1.09*  --  1.48*  CALCIUM 9.6  --  9.7  MG  --    < > 2.1  AST 59*  --   --   ALT 49*  --   --   ALKPHOS 71  --   --   BILITOT 1.2  --   --    < > = values in this interval not displayed.   ------------------------------------------------------------------------------------------------------------------  Cardiac Enzymes Recent Labs  Lab 12/12/18 0810  TROPONINI 0.18*   ------------------------------------------------------------------------------------------------------------------  RADIOLOGY:  Ct Head Wo Contrast  Result Date: 12/11/2018 CLINICAL DATA:  85 year old female with altered mental status. EXAM: CT HEAD WITHOUT CONTRAST TECHNIQUE: Contiguous axial images were obtained from the base of the skull through the vertex without intravenous contrast. COMPARISON:  12/11/2016 MR and prior studies  FINDINGS: Brain: No evidence of acute infarction, hemorrhage, hydrocephalus, extra-axial collection or mass lesion/mass effect. Atrophy and chronic small-vessel white matter ischemic changes again noted. Vascular:  Atherosclerotic calcifications noted. Skull: Normal. Negative for fracture or focal lesion. Sinuses/Orbits: No acute finding. Other: None. IMPRESSION: 1. No evidence of acute intracranial abnormality 2. Atrophy and chronic small-vessel white matter ischemic changes. Electronically Signed   By: Margarette Canada M.D.   On: 12/11/2018 15:19   Dg Chest Portable 1 View  Result Date: 12/10/2018 CLINICAL DATA:  Dyspnea, history of renal cancer EXAM: PORTABLE CHEST 1 VIEW COMPARISON:  Chest CT 12/10/2018 FINDINGS: Cardiomegaly with nonaneurysmal atherosclerotic aorta is noted. Diffuse mild-to-moderate interstitial edema is noted as noted same day chest CT however the bilateral pleural effusions are less apparent radiographically. Partially visualized abdominal aortic stent graft noted. Acute nor aggressive osseous abnormality IMPRESSION: Cardiomegaly with mild-to-moderate pulmonary edema, similar in appearance to prior earlier chest CT. Electronically Signed   By: Ashley Royalty M.D.   On: 12/10/2018 19:48   Ct Angio Chest/abd/pel For Dissection W And/or Wo Contrast  Result Date: 12/10/2018 CLINICAL DATA:  Acute onset of generalized abdominal pain and difficulty breathing. EXAM: CT ANGIOGRAPHY CHEST, ABDOMEN AND PELVIS TECHNIQUE: Multidetector CT imaging through the chest, abdomen and pelvis was performed using the standard protocol during bolus administration of intravenous contrast. Multiplanar reconstructed images and MIPs were obtained and reviewed to evaluate the vascular anatomy. CONTRAST:  32mL OMNIPAQUE IOHEXOL 350 MG/ML SOLN COMPARISON:  None. FINDINGS: CTA CHEST FINDINGS Cardiovascular: There is no evidence of aortic dissection. There is no evidence of aneurysmal dilatation. Scattered calcification is noted along the thoracic aorta and proximal great vessels, with mild luminal narrowing at the left carotid artery, and a prominent focal thrombus resulting in severe luminal narrowing at the proximal left subclavian  artery. The heart is mildly enlarged. Mediastinum/Nodes: A mildly enlarged subcarinal node is noted, measuring 1.3 cm in short axis. No additional mediastinal lymphadenopathy is seen. No pericardial effusion is identified. The visualized portions of the thyroid gland are unremarkable. No axillary lymphadenopathy is seen. Lungs/Pleura: Small to moderate bilateral pleural effusions are noted. Diffuse interstitial prominence and haziness is noted throughout both lungs, compatible with pulmonary edema. Underlying emphysema is noted. No pneumothorax is seen. No masses are identified. Musculoskeletal: No acute osseous abnormalities are identified. The patient's right shoulder arthroplasty is only partially imaged on this study. The visualized musculature is unremarkable in appearance. Review of the MIP images confirms the above findings. CTA ABDOMEN AND PELVIS FINDINGS VASCULAR Aorta: Scattered calcification is noted along the abdominal aorta, with mild irregularity of the aortic wall, but no definite penetrating aortic ulceration. The patient's aortoiliac stent graft appears patent, with mild associated mural thrombus but no significant luminal narrowing. No significant aneurysmal dilatation is seen. There is no evidence of aortic dissection at this time. Celiac: There is mild focal narrowing at the origin of the celiac trunk. SMA: There is minimal narrowing at the proximal superior mesenteric artery, with associated calcification. Renals: The patient is status post right-sided nephrectomy. There is mild narrowing at the proximal left renal artery, with associated calcification. IMA: The inferior mesenteric artery is not definitely seen. Inflow: The internal and external iliac arteries appear grossly intact. The visualized portions of the common femoral arteries and their branches are unremarkable. Veins: Visualized venous structures are unremarkable in appearance. Review of the MIP images confirms the above findings.  NON-VASCULAR Hepatobiliary: The liver is unremarkable in appearance. Small layering stones are noted at the gallbladder.  The gallbladder is otherwise unremarkable. The common bile duct is mildly dilated, measuring 1.1 cm in caliber. Pancreas: The pancreas is within normal limits. An apparent cystic focus of the head of the pancreas is thought to reflect normal duct structures. Spleen: The spleen is unremarkable in appearance. Adrenals/Urinary Tract: The adrenal glands are unremarkable in appearance. The patient is status post right-sided nephrectomy. A small left renal cyst is noted. There is no evidence of hydronephrosis. No renal or ureteral stones are identified. No perinephric stranding is seen. Stomach/Bowel: The stomach is unremarkable in appearance. The small bowel is within normal limits. The appendix is not visualized; there is no evidence for appendicitis. Mild diverticulosis is noted along the distal descending and proximal sigmoid colon, without evidence of diverticulitis. Lymphatic: No retroperitoneal or pelvic sidewall lymphadenopathy is seen. Reproductive: The bladder is moderately distended and grossly unremarkable. A small mildly calcified uterine fibroid is noted. No suspicious adnexal masses are seen. Other: An anterior abdominal wall mesh is noted. Musculoskeletal: No acute osseous abnormalities are identified. There is grade 2 anterolisthesis of L4 on L5, with associated vacuum phenomenon and underlying facet disease. The visualized musculature is unremarkable in appearance. Review of the MIP images confirms the above findings. IMPRESSION: 1. No evidence of aortic dissection. No evidence of aneurysmal dilatation. 2. Aortoiliac stent graft appears patent, with mild associated mural thrombus but no significant luminal narrowing. 3. Prominent focal thrombus noted at the proximal left subclavian artery, resulting in severe focal luminal narrowing. 4. Mild cardiomegaly. Diffuse interstitial  prominence and haziness throughout both lungs, compatible with pulmonary edema. Small to moderate bilateral pleural effusions noted. Underlying emphysema noted. 5. Mildly enlarged subcarinal node, measuring 1.3 cm in short axis, of uncertain significance. 6. Mild narrowing at the proximal left renal artery, and mild narrowing at the origin of the celiac trunk. 7. Mild diverticulosis along the distal descending and proximal sigmoid colon, without evidence of diverticulitis. 8. Cholelithiasis. Gallbladder otherwise unremarkable. 9. Mildly dilated common bile duct, measuring 1.1 cm in caliber. Would correlate with LFTs. 10. Small left renal cyst noted. 11. Small mildly calcified uterine fibroid. Aortic Atherosclerosis (ICD10-I70.0) and Emphysema (ICD10-J43.9). Electronically Signed   By: Garald Balding M.D.   On: 12/10/2018 19:31   US Abdomen Limited Ruq  Result Date: 12/10/2018 CLINICAL DATA:  Cholelithiasis on CT. EXAM: ULTRASOUND ABDOMEN LIMITED RIGHT UPPER QUADRANT COMPARISON:  CT 12/10/2018 FINDINGS: Gallbladder: Multiple stones and sludge layering in the gallbladder. Largest stones measure up to about 5 mm diameter. No gallbladder wall thickening or edema. Murphy's sign is negative. Common bile duct: Diameter: 6 mm, normal Liver: No focal lesion identified. Within normal limits in parenchymal echogenicity. Portal vein is patent on color Doppler imaging with normal direction of blood flow towards the liver. Incidental note of small right pleural effusion. IMPRESSION: Cholelithiasis and gallbladder sludge without additional changes to suggest cholecystitis. Right pleural effusion. Electronically Signed   By: Lucienne Capers M.D.   On: 12/10/2018 21:18    EKG:   Orders placed or performed during the hospital encounter of 12/10/18   ED EKG   ED EKG   EKG 12-Lead   EKG 12-Lead    ASSESSMENT AND PLAN:   85 year old female with multiple medical problems including hypertension, CAD, hypothyroidism,  depression anxiety, AAA, CKD, CHF and dementia presents to hospital secondary to abdominal pain.  1.  Acute on chronic systolic CHF exacerbation-  -ECHO with EF <20% -Strict input and output monitoring.  Daily weights -Salt restriction and fluid restriction. -BNP  is still elevated.  Continue IV Lasix and monitor -Continue cardiac medications.  Patient on Coreg, statin and lisinopril -Needing 2 to 3 L oxygen acutely.  Wean off as tolerated - appreciate cardiology consult  2. New onset afib- likely has paroxysmal afib - with low EF, high risk for stroke - start heparin drip-not sure if a candidate for long term anticoagulation given dementia, low dose coreg added  3.  Elevated troponins-likely demand ischemia not NSTEMI.  Patient denies any chest pain  4.  Confusion and disorientation- secondary to underlying dementia.  CT of the head with chronic changes.  Patient was staying at home by herself.  With new cardiac medications, she will need family to help her at discharge.  Daughter is flying in from [place] today -Case manager aware -UA negative for any infection  6.  Lower abdominal pain-musculoskeletal pain in the left groin. -CT of the abdomen showing gallstones but no acute cholecystitis symptoms -Symptoms have resolved and patient is tolerating diet well.  7.  DVT prophylaxis-on heparin drip  Physical therapy consulted-   All the records are reviewed and case discussed with Care Management/Social Workerr. Management plans discussed with the patient, family and they are in agreement.  CODE STATUS: Full Code  TOTAL TIME TAKING CARE OF THIS PATIENT: 38 minutes.   POSSIBLE D/C IN 2 DAYS, DEPENDING ON CLINICAL CONDITION.   Gladstone Lighter M.D on 12/12/2018 at 12:49 PM  Between 7am to 6pm - Pager - [phone]  After 6pm go to [email or web address] - password [facility]  Office  [phone]  CC: Primary care physician; [name], MD

## 2018-12-13 DIAGNOSIS — I5043 Acute on chronic combined systolic (congestive) and diastolic (congestive) heart failure: Secondary | ICD-10-CM

## 2018-12-13 LAB — BASIC METABOLIC PANEL
Anion gap: 7 (ref 5–15)
BUN: 27 mg/dL — ABNORMAL HIGH (ref 8–23)
CO2: 20 mmol/L — ABNORMAL LOW (ref 22–32)
Calcium: 9.5 mg/dL (ref 8.9–10.3)
Chloride: 109 mmol/L (ref 98–111)
Creatinine, Ser: 1.16 mg/dL — ABNORMAL HIGH (ref 0.44–1.00)
GFR calc Af Amer: 51 mL/min — ABNORMAL LOW (ref >60)
GFR calc non Af Amer: 44 mL/min — ABNORMAL LOW (ref >60)
Glucose, Bld: 104 mg/dL — ABNORMAL HIGH (ref 70–99)
Potassium: 3.9 mmol/L (ref 3.5–5.1)
Sodium: 136 mmol/L (ref 135–145)

## 2018-12-13 LAB — HEPARIN LEVEL (UNFRACTIONATED)
Heparin Unfractionated: 0.43 IU/mL (ref 0.30–0.70)
Heparin Unfractionated: 0.36 IU/mL (ref 0.30–0.70)

## 2018-12-13 LAB — CBC
HCT: 40.5 % (ref 36.0–46.0)
Hemoglobin: 13.1 g/dL (ref 12.0–15.0)
MCH: 30.3 pg (ref 26.0–34.0)
MCHC: 32.3 g/dL (ref 30.0–36.0)
MCV: 93.8 fL (ref 80.0–100.0)
NRBC: 0 % (ref 0.0–0.2)
PLATELETS: 183 10*3/uL (ref 150–400)
RBC: 4.32 MIL/uL (ref 3.87–5.11)
RDW: 13.4 % (ref 11.5–15.5)
WBC: 4.7 10*3/uL (ref 4.0–10.5)

## 2018-12-13 MED ORDER — HEPARIN BOLUS VIA INFUSION
1600.0000 [IU] | Freq: Once | INTRAVENOUS | Status: AC
  Administered 2018-12-13: 1600 [IU] via INTRAVENOUS
  Filled 2018-12-13: qty 1600

## 2018-12-13 MED ORDER — EZETIMIBE 10 MG PO TABS
10.0000 mg | ORAL_TABLET | Freq: Every day | ORAL | Status: DC
  Administered 2018-12-13 – 2018-12-14 (×2): 10 mg via ORAL
  Filled 2018-12-13 (×2): qty 1

## 2018-12-13 MED ORDER — SODIUM CHLORIDE 0.9% FLUSH
3.0000 mL | Freq: Two times a day (BID) | INTRAVENOUS | Status: DC
  Administered 2018-12-13: 3 mL via INTRAVENOUS

## 2018-12-13 MED ORDER — FUROSEMIDE 10 MG/ML IJ SOLN
20.0000 mg | Freq: Two times a day (BID) | INTRAMUSCULAR | Status: DC
  Administered 2018-12-13 – 2018-12-14 (×3): 20 mg via INTRAVENOUS
  Filled 2018-12-13 (×2): qty 2

## 2018-12-13 MED ORDER — POTASSIUM CHLORIDE CRYS ER 20 MEQ PO TBCR
20.0000 meq | EXTENDED_RELEASE_TABLET | Freq: Every day | ORAL | Status: DC
  Administered 2018-12-13 – 2018-12-14 (×2): 20 meq via ORAL
  Filled 2018-12-13 (×2): qty 1

## 2018-12-13 NOTE — Consult Note (Signed)
ANTICOAGULATION CONSULT NOTE - Initial Consult  Pharmacy Consult for Heparin Drip  Indication: atrial fibrillation  Allergies  Allergen Reactions  . Tramadol Rash    Patient Measurements: Height: 5\' 4"  (162.6 cm) Weight: 117 lb 4.6 oz (53.2 kg) IBW/kg (Calculated) : 54.7   Vital Signs: Temp: 98 F (36.7 C) (03/13 1940) Temp Source: Oral (03/13 1940) BP: 114/86 (03/13 1940) Pulse Rate: 79 (03/13 1940)  Labs: Recent Labs    12/10/18 1758  12/12/18 0502 12/12/18 0810 12/12/18 1318 12/12/18 1933 12/12/18 2234  HGB 13.4  --   --  14.7  --   --   --   HCT 41.6  --   --  44.5  --   --   --   PLT 182  --   --  218  --   --   --   APTT  --   --   --   --  28  --   --   LABPROT  --   --   --   --  13.6  --   --   INR  --   --   --   --  1.1  --   --   HEPARINUNFRC  --   --   --   --   --   --  0.20*  CREATININE 1.09*  --  1.48*  --   --   --   --   CKMB  --   --   --  6.2*  --   --   --   TROPONINI 0.07*   < >  --  0.18* 0.17* 0.15*  --    < > = values in this interval not displayed.    Estimated Creatinine Clearance: 24.6 mL/min (A) (by C-G formula based on SCr of 1.48 mg/dL (H)).   Medical History: Past Medical History:  Diagnosis Date  . AAA (abdominal aortic aneurysm) ([place])    s/p stent in 2010  . Anxiety   . Arthritis   . CAD (coronary artery disease)   . Chronic kidney disease (CKD), stage III (moderate) (HCC)   . Chronic systolic congestive heart failure (HCC)    b.11/2018: EF < 20%, global hypokinesis w/ severe hypokinesis inflat wall, mod. dilated LA/LV a. 05/2016: EF 35-40%, mild LA/LVH, mod. MR  . Dementia ([place])   . Depression   . Diverticulitis    H/O  . GERD (gastroesophageal reflux disease)   . GI bleed    08/2010: GIB s/p dual antiplt therapy post stent  . Hx of migraines   . Hx: UTI (urinary tract infection)   . Hyperlipidemia   . Hypertension   . Kidney stones   . MI (myocardial infarction) ([place])    c. 05/2016: N-STEMI, b.03/2012: N-STEMI, a.  08/2010: N-STEMI w/ DES in LCX  . Renal cell carcinoma   . Thyroid disease     Medications:   Scheduled:  . aspirin  81 mg Oral Daily  . atorvastatin  40 mg Oral q1800  . carvedilol  3.125 mg Oral BID WC  . furosemide  20 mg Intravenous Daily  . heparin  1,600 Units Intravenous Once  . [START ON 12/14/2018] lisinopril  2.5 mg Oral Daily   Infusions:  . heparin 750 Units/hr (12/12/18 1430)    Assessment: Pharmacy consulted for Heparin dosing and monitoring in 85 yo female with new onset Afib. Patient has low EF and is at a high risk for stroke.  Goal of Therapy:  HL level: 0.3-0.7 Monitor platelets by anticoagulation protocol: Yes   Plan:  03/13 @ 2200 HL 0.20 subtherapeutic. Will rebolus heparin 1600 units IV x 1 and increase rate to 850 units/hr and will recheck HL w/ am labs. CBC stable will continue to monitor.  Tobie Lords, PharmD, BCPS Clinical Pharmacist 12/13/2018

## 2018-12-13 NOTE — Consult Note (Signed)
for Heparin Drip  Indication: atrial fibrillation  Allergies  Allergen Reactions  . Tramadol Rash    Patient Measurements: Height: 5\' 4"  (162.6 cm) Weight: 118 lb 12.8 oz (53.9 kg) IBW/kg (Calculated) : 54.7   Vital Signs: Temp: 97.7 F (36.5 C) (03/14 0758) Temp Source: Oral (03/14 0451) BP: 97/69 (03/14 0758) Pulse Rate: 65 (03/14 0758)  Labs: Recent Labs    12/10/18 1758  12/12/18 0502 12/12/18 0810 12/12/18 1318 12/12/18 1933 12/12/18 2234 12/13/18 0559 12/13/18 1405  HGB 13.4  --   --  14.7  --   --   --  13.1  --   HCT 41.6  --   --  44.5  --   --   --  40.5  --   PLT 182  --   --  218  --   --   --  183  --   APTT  --   --   --   --  28  --   --   --   --   LABPROT  --   --   --   --  13.6  --   --   --   --   INR  --   --   --   --  1.1  --   --   --   --   HEPARINUNFRC  --   --   --   --   --   --  0.20* 0.43 0.36  CREATININE 1.09*  --  1.48*  --   --   --   --  1.16*  --   CKMB  --   --   --  6.2*  --   --   --   --   --   TROPONINI 0.07*   < >  --  0.18* 0.17* 0.15*  --   --   --    < > = values in this interval not displayed.    Estimated Creatinine Clearance: 31.8 mL/min (A) (by C-G formula based on SCr of 1.16 mg/dL (H)).   Medical History: Past Medical History:  Diagnosis Date  . AAA (abdominal aortic aneurysm) ([place])    s/p stent in 2010  . Anxiety   . Arthritis   . CAD (coronary artery disease)   . Chronic kidney disease (CKD), stage III (moderate) (HCC)   . Chronic systolic congestive heart failure (HCC)    b.11/2018: EF < 20%, global hypokinesis w/ severe hypokinesis inflat wall, mod. dilated LA/LV a. 05/2016: EF 35-40%, mild LA/LVH, mod. MR  . Dementia ([place])   . Depression   . Diverticulitis    H/O  . GERD (gastroesophageal reflux disease)   . GI bleed    08/2010: GIB s/p dual antiplt therapy post stent  . Hx of migraines   . Hx: UTI (urinary tract infection)   . Hyperlipidemia   .  Hypertension   . Kidney stones   . MI (myocardial infarction) ([place])    c. 05/2016: N-STEMI, b.03/2012: N-STEMI, a. 08/2010: N-STEMI w/ DES in LCX  . Renal cell carcinoma   . Thyroid disease     Medications:   Scheduled:  . aspirin  81 mg Oral Daily  . atorvastatin  40 mg Oral q1800  . carvedilol  3.125 mg Oral BID WC  . ezetimibe  10 mg Oral Daily  . furosemide  20 mg Intravenous BID  . [START  ON 12/14/2018] lisinopril  2.5 mg Oral Daily  . potassium chloride  20 mEq Oral Daily   Infusions:  . heparin 850 Units/hr (12/13/18 0038)    Assessment: Pharmacy consulted for Heparin dosing and monitoring in 85 yo female with new onset Afib. Patient has low EF and is at a high risk for stroke.  03/14 @ 0600 HL 0.43 therapeutic. Will continue current rate and will recheck HL @ 1400.   Goal of Therapy:  HL level: 0.3-0.7 Monitor platelets by anticoagulation protocol: Yes   Plan:  03/14 @ 1405 HL= 0.36. Will continue with current drip rate of 850 units/hr and recheck HL with am labs.  Chinita Greenland PharmD Clinical Pharmacist 12/13/2018

## 2018-12-13 NOTE — Progress Notes (Signed)
    Spoke with patient's daughter via phone to go over case and assess their goals. At this time, after discussion with the patient's daughter, we will not pursue [place] or invasive ischemic evaluation. Continue conservative management with hopeful discharge 3/15//2020.

## 2018-12-13 NOTE — Consult Note (Signed)
ANTICOAGULATION CONSULT NOTE - Initial Consult  Pharmacy Consult for Heparin Drip  Indication: atrial fibrillation  Allergies  Allergen Reactions  . Tramadol Rash    Patient Measurements: Height: 5\' 4"  (162.6 cm) Weight: 118 lb 12.8 oz (53.9 kg) IBW/kg (Calculated) : 54.7   Vital Signs: Temp: 98.4 F (36.9 C) (03/14 0451) Temp Source: Oral (03/14 0451) BP: 101/60 (03/14 0451) Pulse Rate: 62 (03/14 0451)  Labs: Recent Labs    12/10/18 1758  12/12/18 0502 12/12/18 0810 12/12/18 1318 12/12/18 1933 12/12/18 2234 12/13/18 0559  HGB 13.4  --   --  14.7  --   --   --  13.1  HCT 41.6  --   --  44.5  --   --   --  40.5  PLT 182  --   --  218  --   --   --  183  APTT  --   --   --   --  28  --   --   --   LABPROT  --   --   --   --  13.6  --   --   --   INR  --   --   --   --  1.1  --   --   --   HEPARINUNFRC  --   --   --   --   --   --  0.20* 0.43  CREATININE 1.09*  --  1.48*  --   --   --   --  1.16*  CKMB  --   --   --  6.2*  --   --   --   --   TROPONINI 0.07*   < >  --  0.18* 0.17* 0.15*  --   --    < > = values in this interval not displayed.    Estimated Creatinine Clearance: 31.8 mL/min (A) (by C-G formula based on SCr of 1.16 mg/dL (H)).   Medical History: Past Medical History:  Diagnosis Date  . AAA (abdominal aortic aneurysm) ([place])    s/p stent in 2010  . Anxiety   . Arthritis   . CAD (coronary artery disease)   . Chronic kidney disease (CKD), stage III (moderate) (HCC)   . Chronic systolic congestive heart failure (HCC)    b.11/2018: EF < 20%, global hypokinesis w/ severe hypokinesis inflat wall, mod. dilated LA/LV a. 05/2016: EF 35-40%, mild LA/LVH, mod. MR  . Dementia ([place])   . Depression   . Diverticulitis    H/O  . GERD (gastroesophageal reflux disease)   . GI bleed    08/2010: GIB s/p dual antiplt therapy post stent  . Hx of migraines   . Hx: UTI (urinary tract infection)   . Hyperlipidemia   . Hypertension   . Kidney stones   . MI  (myocardial infarction) ([place])    c. 05/2016: N-STEMI, b.03/2012: N-STEMI, a. 08/2010: N-STEMI w/ DES in LCX  . Renal cell carcinoma   . Thyroid disease     Medications:   Scheduled:  . aspirin  81 mg Oral Daily  . atorvastatin  40 mg Oral q1800  . carvedilol  3.125 mg Oral BID WC  . furosemide  20 mg Intravenous Daily  . [START ON 12/14/2018] lisinopril  2.5 mg Oral Daily   Infusions:  . heparin 850 Units/hr (12/13/18 0038)    Assessment: Pharmacy consulted for Heparin dosing and monitoring in 85 yo female with new onset  Afib. Patient has low EF and is at a high risk for stroke.   Goal of Therapy:  HL level: 0.3-0.7 Monitor platelets by anticoagulation protocol: Yes   Plan:  03/14 @ 0600 HL 0.43 therapeutic. Will continue current rate and will recheck HL @ 1400.  Tobie Lords, PharmD, BCPS Clinical Pharmacist 12/13/2018

## 2018-12-13 NOTE — Progress Notes (Addendum)
[facility] NAME: Karen Collins    MR#:  [id number]  DATE OF BIRTH:  [birth year]  SUBJECTIVE:  CHIEF COMPLAINT:   Chief Complaint  Patient presents with  . Abdominal Pain   Sitting up in chair eating breakfast breathing on RA. Denies all complaints when asked. Eager to discharge home, spoke with daughter who flew in from [place] who is HCPOA about plan. Per Cardiology, who also spoke with HCPOA, recommendation is that she stay 1 more night. Lasix increased this AM.   REVIEW OF SYSTEMS:  Review of Systems  Constitutional:  Negative for chills and fever. Negative for malaise/fatigue. HENT: Negative for congestion, ear discharge, hearing loss and nosebleeds.   Eyes: Negative for blurred vision and double vision.  Respiratory: Negative for cough, shortness of breath and wheezing.   Cardiovascular: Negative for chest pain, palpitations and leg swelling.  Gastrointestinal: Negative for abdominal pain, constipation, diarrhea, nausea and vomiting.  Genitourinary: Negative for dysuria.  Musculoskeletal: Negative for myalgias.  Neurological: Negative for dizziness, focal weakness, seizures, weakness and headaches.  Psychiatric/Behavioral: Negative for depression.  DRUG ALLERGIES:   Allergies  Allergen Reactions  . Tramadol Rash   VITALS:  Blood pressure 97/69, pulse 65, temperature 97.7 F (36.5 C), resp. rate 18, height 5\' 4"  (1.626 m), weight 53.9 kg, SpO2 100 %. PHYSICAL EXAMINATION:  GENERAL:  85 y.o.-year-old patient sitting up in chair with no acute distress.  EYES: Pupils equal, round, reactive to light and accommodation. No scleral icterus. Extraocular muscles intact.  HEENT: Head atraumatic, normocephalic. Oropharynx and nasopharynx clear.  NECK:  Supple, no jugular venous distention. No thyroid enlargement, no tenderness.  LUNGS: Normal breath sounds bilaterally, no wheezing, rales,rhonchi or crepitation. No use of  accessory muscles of respiration. CARDIOVASCULAR: S1, S2 normal. No  rubs, or gallops.  2/6 systolic murmur is present ABDOMEN: Soft, nontender, nondistended. Bowel sounds present. No organomegaly or mass.  EXTREMITIES: No pedal edema, cyanosis, or clubbing.  NEUROLOGIC: Cranial nerves II through XII are intact. Sensation intact. Gait not checked.  Overall weakness noted. PSYCHIATRIC: The patient is alert and oriented x self and person.  Impulsive. Short term memory issues noted in 60 sec intervals.  SKIN: No obvious rash, lesion, or ulcer.  LABORATORY PANEL:  Female CBC Recent Labs  Lab 12/13/18 0559  WBC 4.7  HGB 13.1  HCT 40.5  PLT 183   ------------------------------------------------------------------------------------------------------------------ Chemistries  Recent Labs  Lab 12/10/18 1758  12/12/18 0502 12/13/18 0559  NA 134*  --  140 136  K 4.1  --  3.2* 3.9  CL 104  --  107 109  CO2 21*  --  25 20*  GLUCOSE 113*  --  110* 104*  BUN 15  --  26* 27*  CREATININE 1.09*  --  1.48* 1.16*  CALCIUM 9.6  --  9.7 9.5  MG  --    < > 2.1  --   AST 59*  --   --   --   ALT 49*  --   --   --   ALKPHOS 71  --   --   --   BILITOT 1.2  --   --   --    < > = values in this interval not displayed.   RADIOLOGY:  No results found. ASSESSMENT AND PLAN:   85 year old female with multiple medical problems including hypertension, CAD, hypothyroidism, depression anxiety, AAA, CKD, CHF and dementia presents to hospital secondary to  abdominal pain.  1.  Acute on chronic systolic CHF exacerbation-  -ECHO with EF <20% -Strict input and output monitoring.  Daily weights - Documented UOP of 540 mL for the past 24 hours with a net - 3.9 L for the admission. Weight 53.2->53.9 kg over the past 24 hours. Remains on heparin with stable HGB of 13.1.  -Salt restriction and fluid restriction. -BNP is still elevated.  Continue IV Lasix and monitor - Renal function improved from 1.48->1.16.  Potassium improved from 3.2->3.9 -Continue cardiac medications.  Patient on Coreg, statin and lisinopril -Needed 2 to 3 L oxygen acutely.  Weaned off as tolerated. Was on RA today. - appreciate cardiology consult: increased Lasix dose today. No need for invasive cardiac intervention. Christell Faith PA-C spoke with family. Plan for conservative management with discharge tomorrow.   2. New onset afib- likely has paroxysmal afib - with low EF, high risk for stroke - start heparin drip-not sure if a candidate for long term anticoagulation given dementia, low dose coreg added - pharmacy following  3.  Elevated troponins-likely demand ischemia not NSTEMI.  Patient denies any chest pain. Continue ASA, Lipitor. Will need to follow-up with cardiology for anticoagulation recommendations to transition at discharge.  4.  Confusion and disorientation- secondary to underlying dementia.  CT of the head with chronic changes.  Patient was staying at home by herself.  With new cardiac medications, she will need family to help her at discharge.  Daughter is flying in from [place] today -Case manager aware -UA negative for any infection  6.  Lower abdominal pain-musculoskeletal pain in the left groin. -CT of the abdomen showing gallstones but no acute cholecystitis symptoms -Symptoms have resolved and patient is tolerating diet well.  7.  DVT prophylaxis-on heparin drip  8. HLD Per cardiology note: LDL of 171 from 07/2018 -In this setting, doubt she would be a candidate for PCSK-9 inhibitor  -Continue Lipitor -Added Zetia  Physical therapy consulted- Recommend HH PT, intermittent supervision   All the records are reviewed and case is discussed with Care Management/Social Worker. Management plans discussed with the patient and/or family and they are in agreement.  CODE STATUS: Full Code  TOTAL TIME TAKING CARE OF THIS PATIENT: 30 minutes.   More than 50% of the time was spent in  counseling/coordination of care: YES  POSSIBLE D/C IN 1 DAYS, DEPENDING ON CLINICAL CONDITION.   Maui Britten PA-C on 12/13/2018 at 2:36 PM  Between 7am to 6pm - Pager - [phone]  After 6 pm go to [email or web address] - Proofreader  [facility]  Office  [phone]  CC: Primary care physician; [name], MD  Note: This dictation was prepared with Dragon dictation along with smaller phrase technology. Any transcriptional errors that result from this process are unintentional.

## 2018-12-13 NOTE — Progress Notes (Addendum)
Progress Note  Patient Name: Karen Collins Date of Encounter: 12/13/2018  Primary Cardiologist: Rockey Situ  Subjective   No complaints this morning, up sitting in recliner eating breakfast. Documented UOP of 540 mL for the past 24 hours with a net - 3.9 L for the admission. Weight 53.2-->53.9 kg over the past 24 hours. Renal function improved from 1.48-->1.16. Potassium improved from 3.2-->3.9. Remains on heparin gtt with stable HGB of 13.1.   Inpatient Medications    Scheduled Meds: . aspirin  81 mg Oral Daily  . atorvastatin  40 mg Oral q1800  . carvedilol  3.125 mg Oral BID WC  . furosemide  20 mg Intravenous Daily  . [START ON 12/14/2018] lisinopril  2.5 mg Oral Daily   Continuous Infusions: . heparin 850 Units/hr (12/13/18 0038)   PRN Meds: acetaminophen **OR** acetaminophen, bisacodyl, HYDROcodone-acetaminophen, morphine injection **OR** [DISCONTINUED]  morphine injection, ondansetron **OR** ondansetron (ZOFRAN) IV, senna-docusate   Vital Signs    Vitals:   12/12/18 1559 12/12/18 1940 12/13/18 0451 12/13/18 0758  BP: (!) 121/94 114/86 101/60 97/69  Pulse: (!) 58 79 62 65  Resp: 19 20 20 18   Temp: 97.8 F (36.6 C) 98 F (36.7 C) 98.4 F (36.9 C) 97.7 F (36.5 C)  TempSrc: Oral Oral Oral   SpO2: 96% 98% 99% 100%  Weight:   53.9 kg   Height:        Intake/Output Summary (Last 24 hours) at 12/13/2018 0901 Last data filed at 12/13/2018 0452 Gross per 24 hour  Intake 360 ml  Output 900 ml  Net -540 ml   Filed Weights   12/10/18 1748 12/12/18 0620 12/13/18 0451  Weight: 60.6 kg 53.2 kg 53.9 kg    Telemetry    Afib/flutter, 60s to 90s bpm - Personally Reviewed  ECG    n/a - Personally Reviewed  Physical Exam   GEN: Elderly and frail appearing; No acute distress.   Neck: No JVD. Cardiac: Irregularly irregular, no murmurs, rubs, or gallops.  Respiratory: Diminished breath sounds bilaterally.  GI: Soft, nontender, non-distended.   MS: No edema; No  deformity. Neuro:  Alert; Nonfocal.  Psych: Normal affect.  Labs    Chemistry Recent Labs  Lab 12/10/18 1758 12/12/18 0502 12/13/18 0559  NA 134* 140 136  K 4.1 3.2* 3.9  CL 104 107 109  CO2 21* 25 20*  GLUCOSE 113* 110* 104*  BUN 15 26* 27*  CREATININE 1.09* 1.48* 1.16*  CALCIUM 9.6 9.7 9.5  PROT 5.8*  --   --   ALBUMIN 3.8  --   --   AST 59*  --   --   ALT 49*  --   --   ALKPHOS 71  --   --   BILITOT 1.2  --   --   GFRNONAA 47* 33* 44*  GFRAA 55* 38* 51*  ANIONGAP 9 8 7      Hematology Recent Labs  Lab 12/10/18 1758 12/12/18 0810 12/13/18 0559  WBC 3.7* 5.0 4.7  RBC 4.42 4.77 4.32  HGB 13.4 14.7 13.1  HCT 41.6 44.5 40.5  MCV 94.1 93.3 93.8  MCH 30.3 30.8 30.3  MCHC 32.2 33.0 32.3  RDW 13.3 13.6 13.4  PLT 182 218 183    Cardiac Enzymes Recent Labs  Lab 12/11/18 0200 12/12/18 0810 12/12/18 1318 12/12/18 1933  TROPONINI 0.11* 0.18* 0.17* 0.15*   No results for input(s): TROPIPOC in the last 168 hours.   BNP Recent Labs  Lab 12/10/18  1758 12/12/18 0810  BNP 1,591.0* 501.0*     DDimer No results for input(s): DDIMER in the last 168 hours.   Radiology    Ct Head Wo Contrast  Result Date: 12/11/2018 IMPRESSION: 1. No evidence of acute intracranial abnormality 2. Atrophy and chronic small-vessel white matter ischemic changes. Electronically Signed   By: Margarette Canada M.D.   On: 12/11/2018 15:19    Cardiac Studies   2D Echo 12/11/2018: 1. The left ventricle has severely reduced systolic function, with an ejection fraction of <20%. The cavity size was mildly dilated. Left ventricular diastolic Doppler parameters are consistent with impaired relaxation. Global hypokinesis with severe  hypokinesis of the inferior and lateral wall.  2. The right ventricle has normal systolic function. The cavity was normal. There is no increase in right ventricular wall thickness. Unable to estimate RVSP  3. Left atrial size was moderately dilated.  4. Challenging  image quality.  FINDINGS  Left Ventricle: The left ventricle has severely reduced systolic function, with an ejection fraction of 20-25%. The cavity size was mildly dilated. There is no increase in left ventricular wall thickness. Left ventricular diastolic Doppler parameters  are consistent with impaired relaxation. Right Ventricle: The right ventricle has normal systolic function. The cavity was normal. There is no increase in right ventricular wall thickness. Left Atrium: left atrial size was moderately dilated Right Atrium: right atrial size was normal in size. Right atrial pressure is estimated at 10 mmHg. Interatrial Septum: No atrial level shunt detected by color flow Doppler. Pericardium: There is no evidence of pericardial effusion. Mitral Valve: The mitral valve is normal in structure. Mitral valve regurgitation is mild by color flow Doppler. Tricuspid Valve: The tricuspid valve is grossly normal. Tricuspid valve regurgitation was not visualized by color flow Doppler. Aortic Valve: The aortic valve was not well visualized Aortic valve regurgitation was not assessed by color flow Doppler. Pulmonic Valve: The pulmonic valve was grossly normal. Pulmonic valve regurgitation was not assessed by color flow Doppler. Venous: The inferior vena cava is normal in size with greater than 50% respiratory variability.     Patient Profile     85 y.o. female with history of Alzheimer's dementia, CAD with NSTEMI in 08/2010 and 03/2012 with occlusion of the LCx s/p post PCI/overlapping stents with residual stenosis, HFrEF secondary to mixed ICM/NICM with EF (2013) of 35% and thought to be out of proportion to CAD, AAA s/p stenting, AV malformations of the colon, prior GI bleed requiring transfusion that persisted requiring readmission for GI bleed, CKD, cancer, noncompliance despite significant assistance, and anxiety who we are seeing for new onset Afib/flutter, elevated troponin, and acute on chronic  HFrEF.  Assessment & Plan    1. Acute on chronic HFrEF secondary to mixed ICM and NICM: -Increase IV Lasix to 20 mg bid (message from [name] this morning) with KCl repletion -Continue Coreg and lisinopril -BP currently precludes washout of ACEi for 36 hours with transition to Entresto -If BP, renal function, and potassium allow, consider addition of spironolactone prior to discharge -Daily weights -Strict I/O  2. New onset Afib/flutter: -Noted around 23 PM on 12/11/2018 -Remains in Afib/flutter with well controlled ventricular rates -Appears asymptomatic (underlying dementia) -Continue rate control with Coreg, if needed for rate control, could transition to metoprolol, though given her cardiomyopathy, would prefer to use Toprol if this change is required -Given her history of noncompliance, dementia, and prior significant GI bleed requiring admission and transfusion with AV malformation, she has not  been felt to be a good candidate for [place] -CHADS2VASc at least 5 (CHF, age x 2, vascular disease, female) -TSH normal -Potassium repleted -Magnesium at goal  3. Elevated troponin: -Minimally elevated and flat trending with a peak of 0.18 -Felt to be secondary supply demand mismatch ischemia in the setting of known CAD with volume overload, Afib with RVR, and AKI -Echo as above -Remains on heparin gtt given #2 -Has been felt to not be a good candidate for invasive ischemic evaluation given her underlying dementia, noncompliance despite significant assistance and acute illness -Recommend further discussion with family that has HCPOA -For now, continue conservative management -Continue ASA and Lipitor  4. Alzheimer's dementia: -Per IM -She indicates her children live in [place] -Tourist information centre manager is assisting   5. Hypokalemia: -Improved  6. AKI: -Improved  7. HLD: -LDL of 171 from 07/2018 -She has a history of noncompliance -In this setting, doubt she would be a candidate for  PCSK-9 inhibitor  -Continue Lipitor -Add Zetia  For questions or updates, please contact [place] Please consult [email or web address] for contact info under Cardiology/STEMI.    Signed, Christell Faith, PA-C [place] Pager: [phone] 12/13/2018, 9:01 AM  Patient seen with PA, agree with the above note.   Volume status looks ok to me on exam, noted Lasix 20 mg IV bid ordered for today by [name].  She can get IV Lasix today, can start on po Lasix 40 mg daily tomorrow and likely be discharged.   Agree with the above assessment that she is a poor candidate for long-term oral anticoagulation. Rate is reasonably controlled on Coreg.   We will sign off.  Call with questions.  She can followup with [name].   Loralie Champagne 12/13/2018 10:23 AM

## 2018-12-14 LAB — BASIC METABOLIC PANEL
Anion gap: 8 (ref 5–15)
BUN: 27 mg/dL — ABNORMAL HIGH (ref 8–23)
CO2: 20 mmol/L — ABNORMAL LOW (ref 22–32)
Calcium: 9.7 mg/dL (ref 8.9–10.3)
Chloride: 106 mmol/L (ref 98–111)
Creatinine, Ser: 1.12 mg/dL — ABNORMAL HIGH (ref 0.44–1.00)
GFR calc Af Amer: 53 mL/min — ABNORMAL LOW (ref >60)
GFR calc non Af Amer: 46 mL/min — ABNORMAL LOW (ref >60)
Glucose, Bld: 108 mg/dL — ABNORMAL HIGH (ref 70–99)
Potassium: 4.2 mmol/L (ref 3.5–5.1)
Sodium: 134 mmol/L — ABNORMAL LOW (ref 135–145)

## 2018-12-14 LAB — CBC
HCT: 42.6 % (ref 36.0–46.0)
Hemoglobin: 13.6 g/dL (ref 12.0–15.0)
MCH: 30.3 pg (ref 26.0–34.0)
MCHC: 31.9 g/dL (ref 30.0–36.0)
MCV: 94.9 fL (ref 80.0–100.0)
PLATELETS: 192 10*3/uL (ref 150–400)
RBC: 4.49 MIL/uL (ref 3.87–5.11)
RDW: 13.2 % (ref 11.5–15.5)
WBC: 4.4 10*3/uL (ref 4.0–10.5)
nRBC: 0 % (ref 0.0–0.2)

## 2018-12-14 LAB — HEPARIN LEVEL (UNFRACTIONATED): Heparin Unfractionated: 0.35 IU/mL (ref 0.30–0.70)

## 2018-12-14 MED ORDER — LISINOPRIL 2.5 MG PO TABS: 2.5000 mg | ORAL_TABLET | Freq: Every day | ORAL | 0 refills | Status: DC

## 2018-12-14 MED ORDER — FUROSEMIDE 20 MG PO TABS: 20.0000 mg | ORAL_TABLET | Freq: Every day | ORAL | 11 refills | Status: DC

## 2018-12-14 MED ORDER — ASPIRIN 81 MG PO CHEW: 81.0000 mg | CHEWABLE_TABLET | Freq: Every day | ORAL | 0 refills | Status: AC

## 2018-12-14 NOTE — Discharge Instructions (Signed)
Atrial Fibrillation ° °Atrial fibrillation is a type of heartbeat that is irregular or fast (rapid). If you have this condition, your heart beats without any order. This makes it hard for your heart to pump blood in a normal way. Having this condition gives you more risk for stroke, heart failure, and other heart problems. °Atrial fibrillation may start all of a sudden and then stop on its own, or it may become a long-lasting problem. °What are the causes? °This condition may be caused by heart conditions, such as: °· High blood pressure. °· Heart failure. °· Heart valve disease. °· Heart surgery. °Other causes include: °· Pneumonia. °· Obstructive sleep apnea. °· Lung cancer. °· Thyroid disease. °· Drinking too much alcohol. °Sometimes the cause is not known. °What increases the risk? °You are more likely to develop this condition if: °· You smoke. °· You are older. °· You have diabetes. °· You are overweight. °· You have a family history of this condition. °· You exercise often and hard. °What are the signs or symptoms? °Common symptoms of this condition include: °· A feeling like your heart is beating very fast. °· Chest pain. °· Feeling short of breath. °· Feeling light-headed or weak. °· Getting tired easily. °Follow these instructions at home: °Medicines °· Take over-the-counter and prescription medicines only as told by your doctor. °· If your doctor gives you a blood-thinning medicine, take it exactly as told. Taking too much of it can cause bleeding. Taking too little of it does not protect you against clots. Clots can cause a stroke. °Lifestyle ° °  ° °· Do not use any tobacco products. These include cigarettes, chewing tobacco, and e-cigarettes. If you need help quitting, ask your doctor. °· Do not drink alcohol. °· Do not drink beverages that have caffeine. These include coffee, soda, and tea. °· Follow diet instructions as told by your doctor. °· Exercise regularly as told by your doctor. °General  instructions °· If you have a condition that causes breathing to stop for a short period of time (apnea), treat it as told by your doctor. °· Keep a healthy weight. Do not use diet pills unless your doctor says they are safe for you. Diet pills may make heart problems worse. °· Keep all follow-up visits as told by your doctor. This is important. °Contact a doctor if: °· You notice a change in the speed, rhythm, or strength of your heartbeat. °· You are taking a blood-thinning medicine and you see more bruising. °· You get tired more easily when you move or exercise. °· You have a sudden change in weight. °Get help right away if: ° °· You have pain in your chest or your belly (abdomen). °· You have trouble breathing. °· You have blood in your vomit, poop, or pee (urine). °· You have any signs of a stroke. "BE FAST" is an easy way to remember the main warning signs: °? B - Balance. Signs are dizziness, sudden trouble walking, or loss of balance. °? E - Eyes. Signs are trouble seeing or a change in how you see. °? F - Face. Signs are sudden weakness or loss of feeling in the face, or the face or eyelid drooping on one side. °? A - Arms. Signs are weakness or loss of feeling in an arm. This happens suddenly and usually on one side of the body. °? S - Speech. Signs are sudden trouble speaking, slurred speech, or trouble understanding what people say. °? T - Time.   or loss of feeling in the face, or the face or eyelid drooping on one side.  ? A - Arms. Signs are weakness or loss of feeling in an arm. This happens suddenly and usually on one side of the body.  ? S - Speech. Signs are sudden trouble speaking, slurred speech, or trouble understanding what people say.  ? T - Time. Time to call emergency services. Write down what time symptoms started.  · You have other signs of a stroke, such as:  ? A sudden, very bad headache with no known cause.  ? Feeling sick to your stomach (nausea).  ? Throwing up (vomiting).  ? Jerky movements you cannot control (seizure).  These symptoms may be an emergency. Do not wait to see if the symptoms will go away. Get medical help right away. Call your local emergency services (911 in the [place] Do not drive yourself to the hospital.  Summary  · Atrial fibrillation is a type of heartbeat that is irregular  or fast (rapid).  · You are at higher risk of this condition if you smoke, are older, have diabetes, or are overweight.  · Follow your doctor's instructions about medicines, diet, exercise, and follow-up visits.  · Get help right away if you think that you have signs of a stroke.  This information is not intended to replace advice given to you by your health care provider. Make sure you discuss any questions you have with your health care provider.  Document Released: 06/26/2008 Document Revised: 11/08/2017 Document Reviewed: 11/08/2017  Elsevier Interactive Patient Education © 2019 [employer]

## 2018-12-14 NOTE — Consult Note (Signed)
[place] for Heparin Drip  Indication: atrial fibrillation  Allergies  Allergen Reactions  . Tramadol Rash    Patient Measurements: Height: 5\' 4"  (162.6 cm) Weight: 120 lb 6.4 oz (54.6 kg) IBW/kg (Calculated) : 54.7   Vital Signs: Temp: 98 F (36.7 C) (03/15 0410) Temp Source: Oral (03/14 1952) BP: 113/74 (03/15 0410) Pulse Rate: 68 (03/15 0410)  Labs: Recent Labs    12/12/18 0502  12/12/18 0810 12/12/18 1318 12/12/18 1933  12/13/18 0559 12/13/18 1405 12/14/18 0402  HGB  --    < > 14.7  --   --   --  13.1  --  13.6  HCT  --   --  44.5  --   --   --  40.5  --  42.6  PLT  --   --  218  --   --   --  183  --  192  APTT  --   --   --  28  --   --   --   --   --   LABPROT  --   --   --  13.6  --   --   --   --   --   INR  --   --   --  1.1  --   --   --   --   --   HEPARINUNFRC  --   --   --   --   --    < > 0.43 0.36 0.35  CREATININE 1.48*  --   --   --   --   --  1.16*  --  1.12*  CKMB  --   --  6.2*  --   --   --   --   --   --   TROPONINI  --   --  0.18* 0.17* 0.15*  --   --   --   --    < > = values in this interval not displayed.    Estimated Creatinine Clearance: 33.4 mL/min (A) (by C-G formula based on SCr of 1.12 mg/dL (H)).   Medical History: Past Medical History:  Diagnosis Date  . AAA (abdominal aortic aneurysm) ([facility])    s/p stent in 2010  . Anxiety   . Arthritis   . CAD (coronary artery disease)   . Chronic kidney disease (CKD), stage III (moderate) (HCC)   . Chronic systolic congestive heart failure ([facility])    b.11/2018: EF < 20%, global hypokinesis w/ severe hypokinesis inflat wall, mod. dilated LA/LV a. 05/2016: EF 35-40%, mild LA/LVH, mod. MR  . Dementia ([facility])   . Depression   . Diverticulitis    H/O  . GERD (gastroesophageal reflux disease)   . GI bleed    08/2010: GIB s/p dual antiplt therapy post stent  . Hx of migraines   . Hx: UTI (urinary tract infection)   . Hyperlipidemia   . Hypertension   .  Kidney stones   . MI (myocardial infarction) ([facility])    c. 05/2016: N-STEMI, b.03/2012: N-STEMI, a. 08/2010: N-STEMI w/ DES in LCX  . Renal cell carcinoma   . Thyroid disease     Medications:   Scheduled:  . aspirin  81 mg Oral Daily  . atorvastatin  40 mg Oral q1800  . carvedilol  3.125 mg Oral BID WC  . ezetimibe  10 mg Oral Daily  . furosemide  20 mg Intravenous BID  .  lisinopril  2.5 mg Oral Daily  . potassium chloride  20 mEq Oral Daily  . sodium chloride flush  3 mL Intravenous Q12H   Infusions:  . heparin 850 Units/hr (12/13/18 2340)    Assessment: Pharmacy consulted for Heparin dosing and monitoring in 85 yo female with new onset Afib. Patient has low EF and is at a high risk for stroke.  03/14 @ 0600 HL 0.43 therapeutic. Will continue current rate and will recheck HL @ 1400.   Goal of Therapy:  HL level: 0.3-0.7 Monitor platelets by anticoagulation protocol: Yes   Plan:  03/15 @ 0400 HL 0.35 therapeutic. Will continue current rate and will recheck HL w/ am labs. CBC stable will continue to monitor.  Tobie Lords, PharmD, BCPS Clinical Pharmacist 12/14/2018

## 2018-12-14 NOTE — Progress Notes (Signed)
Patient discharged home with daughter who is in town from [place]. Patient and daughter educated on discharge medication regimen and follow up appointment information. Patient is very forgetful, daughter states she will stay with mother until homehealth and PT are set in place. VSS, no concerns at this time.

## 2018-12-14 NOTE — TOC Transition Note (Signed)
Transition of Care [facility]) - CM/SW Discharge Note   Patient Details  Name: Karen Collins MRN: [id number] Date of Birth: 06/06/1934  Transition of Care [facility]) CM/SW Contact:  Latanya Maudlin, RN Phone Number: 12/14/2018, 9:36 AM   Clinical Narrative:   Patient to be discharged per MD order. Orders in place for home health services. Previous RNCM had began workup for discharge with CHF protocol through [facility]. Per patient and family this is still the plan. Signed form faxed to [place] at [facility]. No DME needs. Family to stay with patient until home health is established. Family to transport.     Final next level of care: [place] Barriers to Discharge: No Barriers Identified   Patient Goals and CMS Choice Patient states their goals for this hospitalization and ongoing recovery are:: Wants to go home when she feels better CMS Medicare.gov Compare Post Acute Care list provided to:: Patient Choice offered to / list presented to : Patient  Discharge Placement                       Discharge Plan and Services Discharge Planning Services: CM Consult Post Acute Care Choice: Home Health              HH Arranged: RN, PT, Social Work     Social Determinants of Health ([place]) Interventions     Readmission Risk Interventions No flowsheet data found.

## 2018-12-16 ENCOUNTER — Telehealth: Payer: Self-pay | Admitting: Internal Medicine

## 2018-12-16 DIAGNOSIS — F0390 Unspecified dementia without behavioral disturbance: Secondary | ICD-10-CM | POA: Diagnosis not present

## 2018-12-16 DIAGNOSIS — E785 Hyperlipidemia, unspecified: Secondary | ICD-10-CM | POA: Diagnosis not present

## 2018-12-16 DIAGNOSIS — Z8553 Personal history of malignant neoplasm of renal pelvis: Secondary | ICD-10-CM | POA: Diagnosis not present

## 2018-12-16 DIAGNOSIS — I252 Old myocardial infarction: Secondary | ICD-10-CM | POA: Diagnosis not present

## 2018-12-16 DIAGNOSIS — Z7982 Long term (current) use of aspirin: Secondary | ICD-10-CM | POA: Diagnosis not present

## 2018-12-16 DIAGNOSIS — I130 Hypertensive heart and chronic kidney disease with heart failure and stage 1 through stage 4 chronic kidney disease, or unspecified chronic kidney disease: Secondary | ICD-10-CM | POA: Diagnosis not present

## 2018-12-16 DIAGNOSIS — G43909 Migraine, unspecified, not intractable, without status migrainosus: Secondary | ICD-10-CM | POA: Diagnosis not present

## 2018-12-16 DIAGNOSIS — I4891 Unspecified atrial fibrillation: Secondary | ICD-10-CM | POA: Diagnosis not present

## 2018-12-16 DIAGNOSIS — D509 Iron deficiency anemia, unspecified: Secondary | ICD-10-CM | POA: Diagnosis not present

## 2018-12-16 DIAGNOSIS — N183 Chronic kidney disease, stage 3 (moderate): Secondary | ICD-10-CM | POA: Diagnosis not present

## 2018-12-16 DIAGNOSIS — Z87890 Personal history of sex reassignment: Secondary | ICD-10-CM | POA: Diagnosis not present

## 2018-12-16 DIAGNOSIS — Z955 Presence of coronary angioplasty implant and graft: Secondary | ICD-10-CM | POA: Diagnosis not present

## 2018-12-16 DIAGNOSIS — E213 Hyperparathyroidism, unspecified: Secondary | ICD-10-CM | POA: Diagnosis not present

## 2018-12-16 DIAGNOSIS — K8020 Calculus of gallbladder without cholecystitis without obstruction: Secondary | ICD-10-CM | POA: Diagnosis not present

## 2018-12-16 DIAGNOSIS — D259 Leiomyoma of uterus, unspecified: Secondary | ICD-10-CM | POA: Diagnosis not present

## 2018-12-16 DIAGNOSIS — F419 Anxiety disorder, unspecified: Secondary | ICD-10-CM | POA: Diagnosis not present

## 2018-12-16 DIAGNOSIS — M1990 Unspecified osteoarthritis, unspecified site: Secondary | ICD-10-CM | POA: Diagnosis not present

## 2018-12-16 DIAGNOSIS — K5730 Diverticulosis of large intestine without perforation or abscess without bleeding: Secondary | ICD-10-CM | POA: Diagnosis not present

## 2018-12-16 DIAGNOSIS — I5023 Acute on chronic systolic (congestive) heart failure: Secondary | ICD-10-CM | POA: Diagnosis not present

## 2018-12-16 DIAGNOSIS — F331 Major depressive disorder, recurrent, moderate: Secondary | ICD-10-CM | POA: Diagnosis not present

## 2018-12-16 DIAGNOSIS — I2510 Atherosclerotic heart disease of native coronary artery without angina pectoris: Secondary | ICD-10-CM | POA: Diagnosis not present

## 2018-12-16 DIAGNOSIS — I255 Ischemic cardiomyopathy: Secondary | ICD-10-CM | POA: Diagnosis not present

## 2018-12-16 DIAGNOSIS — Z905 Acquired absence of kidney: Secondary | ICD-10-CM | POA: Diagnosis not present

## 2018-12-16 DIAGNOSIS — J439 Emphysema, unspecified: Secondary | ICD-10-CM | POA: Diagnosis not present

## 2018-12-16 NOTE — Telephone Encounter (Signed)
Attempted to call patient for TCM follow up from hospital no answer left message to call office, Will continue to monitor.

## 2018-12-16 NOTE — Telephone Encounter (Signed)
Pt calling back. Would like a call back  Cb#[phone]

## 2018-12-17 NOTE — Telephone Encounter (Signed)
Transition Care Management Follow-up Telephone Call  How have you been since you were released from the hospital? Patient daughter is with  Her at this time but she is leaving tomorrow , but Education officer, museum is coming out today to evaluate patient for possible rehab or SNF.   Do you understand why you were in the hospital? yes   Do you understand the discharge instrcutions? yes  Items Reviewed:  Medications reviewed: yes  Allergies reviewed: yes  Dietary changes reviewed: yes  Referrals reviewed: yes   Functional Questionnaire:   Activities of Daily Living (ADLs):   She states they are independent in the following: feeding States they require assistance with the following: ambulation, bathing and hygiene, continence, grooming, toileting and dressing   Any transportation issues/concerns?: no   Any patient concerns? Yes, daughter is concerned patient is becoming more forgetful and if she can remain home or not.   Confirmed importance and date/time of follow-up visits scheduled: yes   Confirmed with patient if condition begins to worsen call PCP or go to the ER.  Patient was given the Call-a-Nurse line [phone]: yes

## 2018-12-18 ENCOUNTER — Ambulatory Visit (INDEPENDENT_AMBULATORY_CARE_PROVIDER_SITE_OTHER): Payer: PPO | Admitting: Internal Medicine

## 2018-12-18 ENCOUNTER — Other Ambulatory Visit: Payer: Self-pay

## 2018-12-18 ENCOUNTER — Encounter: Payer: Self-pay | Admitting: Internal Medicine

## 2018-12-18 DIAGNOSIS — K8020 Calculus of gallbladder without cholecystitis without obstruction: Secondary | ICD-10-CM

## 2018-12-18 DIAGNOSIS — I10 Essential (primary) hypertension: Secondary | ICD-10-CM | POA: Diagnosis not present

## 2018-12-18 DIAGNOSIS — I480 Paroxysmal atrial fibrillation: Secondary | ICD-10-CM | POA: Diagnosis not present

## 2018-12-18 DIAGNOSIS — N183 Chronic kidney disease, stage 3 unspecified: Secondary | ICD-10-CM

## 2018-12-18 DIAGNOSIS — Z905 Acquired absence of kidney: Secondary | ICD-10-CM | POA: Diagnosis not present

## 2018-12-18 DIAGNOSIS — I252 Old myocardial infarction: Secondary | ICD-10-CM | POA: Diagnosis not present

## 2018-12-18 DIAGNOSIS — M1990 Unspecified osteoarthritis, unspecified site: Secondary | ICD-10-CM | POA: Diagnosis not present

## 2018-12-18 DIAGNOSIS — F419 Anxiety disorder, unspecified: Secondary | ICD-10-CM | POA: Diagnosis not present

## 2018-12-18 DIAGNOSIS — I5023 Acute on chronic systolic (congestive) heart failure: Secondary | ICD-10-CM | POA: Diagnosis not present

## 2018-12-18 DIAGNOSIS — I5022 Chronic systolic (congestive) heart failure: Secondary | ICD-10-CM

## 2018-12-18 DIAGNOSIS — Z8553 Personal history of malignant neoplasm of renal pelvis: Secondary | ICD-10-CM | POA: Diagnosis not present

## 2018-12-18 DIAGNOSIS — J9601 Acute respiratory failure with hypoxia: Secondary | ICD-10-CM | POA: Diagnosis not present

## 2018-12-18 DIAGNOSIS — Z7982 Long term (current) use of aspirin: Secondary | ICD-10-CM | POA: Diagnosis not present

## 2018-12-18 DIAGNOSIS — Z87890 Personal history of sex reassignment: Secondary | ICD-10-CM | POA: Diagnosis not present

## 2018-12-18 DIAGNOSIS — E7800 Pure hypercholesterolemia, unspecified: Secondary | ICD-10-CM | POA: Diagnosis not present

## 2018-12-18 DIAGNOSIS — I2510 Atherosclerotic heart disease of native coronary artery without angina pectoris: Secondary | ICD-10-CM | POA: Diagnosis not present

## 2018-12-18 DIAGNOSIS — I714 Abdominal aortic aneurysm, without rupture, unspecified: Secondary | ICD-10-CM

## 2018-12-18 DIAGNOSIS — R413 Other amnesia: Secondary | ICD-10-CM | POA: Diagnosis not present

## 2018-12-18 DIAGNOSIS — Z955 Presence of coronary angioplasty implant and graft: Secondary | ICD-10-CM | POA: Diagnosis not present

## 2018-12-18 DIAGNOSIS — K5730 Diverticulosis of large intestine without perforation or abscess without bleeding: Secondary | ICD-10-CM | POA: Diagnosis not present

## 2018-12-18 DIAGNOSIS — Z9189 Other specified personal risk factors, not elsewhere classified: Secondary | ICD-10-CM

## 2018-12-18 DIAGNOSIS — F331 Major depressive disorder, recurrent, moderate: Secondary | ICD-10-CM | POA: Diagnosis not present

## 2018-12-18 DIAGNOSIS — F0390 Unspecified dementia without behavioral disturbance: Secondary | ICD-10-CM | POA: Diagnosis not present

## 2018-12-18 DIAGNOSIS — I130 Hypertensive heart and chronic kidney disease with heart failure and stage 1 through stage 4 chronic kidney disease, or unspecified chronic kidney disease: Secondary | ICD-10-CM | POA: Diagnosis not present

## 2018-12-18 DIAGNOSIS — G43909 Migraine, unspecified, not intractable, without status migrainosus: Secondary | ICD-10-CM | POA: Diagnosis not present

## 2018-12-18 DIAGNOSIS — D259 Leiomyoma of uterus, unspecified: Secondary | ICD-10-CM | POA: Diagnosis not present

## 2018-12-18 DIAGNOSIS — E213 Hyperparathyroidism, unspecified: Secondary | ICD-10-CM | POA: Diagnosis not present

## 2018-12-18 DIAGNOSIS — E785 Hyperlipidemia, unspecified: Secondary | ICD-10-CM | POA: Diagnosis not present

## 2018-12-18 DIAGNOSIS — I4891 Unspecified atrial fibrillation: Secondary | ICD-10-CM | POA: Diagnosis not present

## 2018-12-18 DIAGNOSIS — D509 Iron deficiency anemia, unspecified: Secondary | ICD-10-CM | POA: Diagnosis not present

## 2018-12-18 DIAGNOSIS — I255 Ischemic cardiomyopathy: Secondary | ICD-10-CM | POA: Diagnosis not present

## 2018-12-18 DIAGNOSIS — J439 Emphysema, unspecified: Secondary | ICD-10-CM | POA: Diagnosis not present

## 2018-12-18 NOTE — Progress Notes (Signed)
Patient ID: Karen Collins, female   DOB: 12-17-1933, 85 y.o.   MRN: [id number]   Subjective:    Patient ID: Karen Collins, female    DOB: 1934/02/13, 85 y.o.   MRN: [id number]  HPI  Patient here for hospital follow up.  She is accompanied by her daughter.  History obtained from both of them.  Admitted from 12/10/18 - 12/14/18 with acute respiratory failure with hypoxia and acute on chronic congestive heart failure.  ECHO with EF <20%.  Troponin elevated.  Felt to likely be from demand ischemia.  Diuresed.  Renal function improved.  Recommended continuing aspirin, coreg, statin and lisinopril. Also found to be in afib - paroxysmal afib. Felt to not be a candidate for long termianticoagulation given dementia.  Abdominal pain resolved.  CT revealed gallstones, but no acute cholecystitis.  She is eating.   Has dementia.  Discussed concern regarding her staying by herself.  Concern regarding her safety.  Daughter reports Education officer, museum evaluated today.  Plans for home health in place. ([facility]).   Also planning to get private sitters to help care for her and assure she is taking her medication and getting to appts.  Since her discharge, her breathing is better.  Taking her medications regularly.  Prior to her admission, not taking her medication correctly.  Discussed assisted living.  Pt declines.  Family wants to keep her at home.     Past Medical History:  Diagnosis Date  . AAA (abdominal aortic aneurysm) ([place])    s/p stent in 2010  . Anxiety   . Arthritis   . CAD (coronary artery disease)   . Chronic kidney disease (CKD), stage III (moderate) (HCC)   . Chronic systolic congestive heart failure (HCC)    b.11/2018: EF < 20%, global hypokinesis w/ severe hypokinesis inflat wall, mod. dilated LA/LV a. 05/2016: EF 35-40%, mild LA/LVH, mod. MR  . Dementia ([place])   . Depression   . Diverticulitis    H/O  . GERD (gastroesophageal reflux disease)   . GI bleed    08/2010: GIB s/p dual antiplt  therapy post stent  . Hx of migraines   . Hx: UTI (urinary tract infection)   . Hyperlipidemia   . Hypertension   . Kidney stones   . MI (myocardial infarction) ([place])    c. 05/2016: N-STEMI, b.03/2012: N-STEMI, a. 08/2010: N-STEMI w/ DES in LCX  . Renal cell carcinoma   . Thyroid disease    Past Surgical History:  Procedure Laterality Date  . ABDOMINAL AORTIC ANEURYSM REPAIR     s/p stent  . ABDOMINAL AORTIC ENDOVASCULAR STENT GRAFT    . CARDIAC CATHETERIZATION    . CARDIAC CATHETERIZATION  09/18/2010  . CHOLECYSTECTOMY    . CORONARY STENT PLACEMENT  09/18/2010   CAD; MI s/p stent  . LAPAROSCOPIC PARTIAL NEPHRECTOMY  2010  . NEPHRECTOMY  10/2008   Right  . PARATHYROIDECTOMY    . SHOULDER SURGERY  07/2010   Right  . THYROIDECTOMY     Family History  Problem Relation Age of Onset  . Arthritis Mother   . Heart disease Mother   . Pulmonary fibrosis Mother   . Heart disease Father   . Hodgkin's lymphoma Father   . Breast cancer Sister   . Hyperlipidemia Sister    Social History   Socioeconomic History  . Marital status: Divorced    Spouse name: Not on file  . Number of children: Not on file  . Years of education:  Not on file  . Highest education level: Not on file  Occupational History  . Occupation: Retired    Fish farm manager: RETIRED  Social Needs  . Financial resource strain: Not on file  . Food insecurity:    Worry: Not on file    Inability: Not on file  . Transportation needs:    Medical: Not on file    Non-medical: Not on file  Tobacco Use  . Smoking status: Former Smoker    Packs/day: 1.00    Years: 30.00    Pack years: 30.00    Last attempt to quit: 10/01/2005    Years since quitting: 13.2  . Smokeless tobacco: Never Used  Substance and Sexual Activity  . Alcohol use: Yes    Alcohol/week: 7.0 standard drinks    Types: 4 Glasses of wine, 3 Standard drinks or equivalent per week    Comment: Wine 3-4 times a week   . Drug use: No  . Sexual activity: Never   Lifestyle  . Physical activity:    Days per week: Not on file    Minutes per session: Not on file  . Stress: Not on file  Relationships  . Social connections:    Talks on phone: Not on file    Gets together: Not on file    Attends religious service: Not on file    Active member of club or organization: Not on file    Attends meetings of clubs or organizations: Not on file    Relationship status: Not on file  Other Topics Concern  . Not on file  Social History Narrative   Married   Walks occasionally with house work    Outpatient Encounter Medications as of 12/18/2018  Medication Sig  . aspirin 81 MG chewable tablet Chew 1 tablet (81 mg total) by mouth daily.  Marland Kitchen atorvastatin (LIPITOR) 40 MG tablet Take 1 tablet (40 mg total) by mouth daily at 6 PM.  . carvedilol (COREG) 3.125 MG tablet TAKE ONE TABLET TWICE DAILY WITH A MEAL  . furosemide (LASIX) 20 MG tablet Take 1 tablet (20 mg total) by mouth daily.  Marland Kitchen lisinopril (PRINIVIL,ZESTRIL) 2.5 MG tablet Take 1 tablet (2.5 mg total) by mouth daily.   No facility-administered encounter medications on file as of 12/18/2018.     Review of Systems  Constitutional: Negative for appetite change and unexpected weight change.  HENT: Negative for congestion and sinus pressure.   Respiratory: Negative for cough and chest tightness.        Still some sob with exertion, but has improved.    Cardiovascular: Negative for chest pain, palpitations and leg swelling.  Gastrointestinal: Negative for abdominal pain, diarrhea, nausea and vomiting.  Genitourinary: Negative for difficulty urinating and dysuria.  Musculoskeletal: Negative for joint swelling and myalgias.  Skin: Negative for color change and rash.  Neurological: Negative for dizziness, light-headedness and headaches.  Psychiatric/Behavioral: Negative for agitation and dysphoric mood.       Objective:    Physical Exam Constitutional:      General: She is not in acute distress.     Appearance: Normal appearance.  HENT:     Nose: Nose normal. No congestion.     Mouth/Throat:     Pharynx: No oropharyngeal exudate or posterior oropharyngeal erythema.  Neck:     Musculoskeletal: Neck supple. No muscular tenderness.     Thyroid: No thyromegaly.  Cardiovascular:     Rate and Rhythm: Normal rate and regular rhythm.  Pulmonary:  Effort: No respiratory distress.     Breath sounds: Normal breath sounds. No wheezing.  Abdominal:     General: Bowel sounds are normal.     Palpations: Abdomen is soft.     Tenderness: There is no abdominal tenderness.  Musculoskeletal:        General: No swelling or tenderness.  Lymphadenopathy:     Cervical: No cervical adenopathy.  Skin:    Findings: No erythema or rash.  Neurological:     Mental Status: She is alert.  Psychiatric:        Mood and Affect: Mood normal.        Behavior: Behavior normal.     BP 118/70   Pulse 70   Temp 97.7 F (36.5 C) (Oral)   Resp 16   Wt 124 lb 3.2 oz (56.3 kg)   SpO2 99%   BMI 21.32 kg/m  Wt Readings from Last 3 Encounters:  12/18/18 124 lb 3.2 oz (56.3 kg)  12/14/18 120 lb 6.4 oz (54.6 kg)  07/14/18 133 lb 12.8 oz (60.7 kg)     Lab Results  Component Value Date   WBC 4.4 12/14/2018   HGB 13.6 12/14/2018   HCT 42.6 12/14/2018   PLT 192 12/14/2018   GLUCOSE 108 (H) 12/14/2018   CHOL 251 (H) 07/11/2018   TRIG 110.0 07/11/2018   HDL 58.10 07/11/2018   LDLCALC 171 (H) 07/11/2018   ALT 49 (H) 12/10/2018   AST 59 (H) 12/10/2018   NA 134 (L) 12/14/2018   K 4.2 12/14/2018   CL 106 12/14/2018   CREATININE 1.12 (H) 12/14/2018   BUN 27 (H) 12/14/2018   CO2 20 (L) 12/14/2018   TSH 2.933 12/12/2018   INR 1.1 12/12/2018   HGBA1C 5.1 12/11/2016    Ct Head Wo Contrast  Result Date: 12/11/2018 CLINICAL DATA:  85 year old female with altered mental status. EXAM: CT HEAD WITHOUT CONTRAST TECHNIQUE: Contiguous axial images were obtained from the base of the skull through the vertex  without intravenous contrast. COMPARISON:  12/11/2016 MR and prior studies FINDINGS: Brain: No evidence of acute infarction, hemorrhage, hydrocephalus, extra-axial collection or mass lesion/mass effect. Atrophy and chronic small-vessel white matter ischemic changes again noted. Vascular: Atherosclerotic calcifications noted. Skull: Normal. Negative for fracture or focal lesion. Sinuses/Orbits: No acute finding. Other: None. IMPRESSION: 1. No evidence of acute intracranial abnormality 2. Atrophy and chronic small-vessel white matter ischemic changes. Electronically Signed   By: Margarette Canada M.D.   On: 12/11/2018 15:19       Assessment & Plan:   Problem List Items Addressed This Visit    AAA (abdominal aortic aneurysm) ([place])    Previously evaluated by vascular surgery.        Acute hypoxemic respiratory failure [facility])    Admitted recently as outlined.  Diuresed.  Oxygen weaned off.  Breathing improved.  Follow.  Continue current medication regimen.        Cholelithiasis    Found on CT.  No abdominal pain.  Follow.        Chronic systolic heart failure (HCC) ([place]ronic)    Recently admitted with acute on chronic congestive heart failure.  Diuresed.  Medications restarted.  Discussed importance of taking her medications regularly.  Plan f/u with cardiology.        CKD (chronic kidney disease), stage III (HCC)    Avoid antiinflammatories.  Renal function improved at discharge.  Follow metabolic panel.        Compliance with medication regimen  Discussed with pt and her daughter the importance of taking her medications on a regular basis.  Discussed assisted living.  Declines.  Planning to get private sitters.  Also planning home health.        HTN (hypertension) (Chronic)    Blood pressure under good control.  Continue same medication regimen.  Follow pressures.  Follow metabolic panel.        Hypercholesterolemia    On lipitor.  Follow lipid panel and liver function tests.         Hyperparathyroidism ([place])    Has declined further evaluation.  Follow calcium.       MDD (major depressive disorder), recurrent episode, moderate ([place])    Not on medication now.  Overall stable.  Follow.        Memory change    Has seen Dr Manuella Ghazi.  Documented dementia.  On on medication.  Has desired no medication. Follow.        Paroxysmal A-fib ([place])    afib in hospital.  Appears to be in regular rhythm now. Chronic anticoagulation not recommended secondary to her dementia, etc.  Is taking aspirin.            [name], MD

## 2018-12-19 NOTE — Discharge Summary (Signed)
[facility] NAME: Karen Collins    MR#:  [id number]  DATE OF BIRTH:  12/09/1933  DATE OF ADMISSION:  12/10/2018   ADMITTING PHYSICIAN: Arta Silence, MD  DATE OF DISCHARGE: 12/14/2018 11:32 AM  PRIMARY CARE PHYSICIAN: [name], MD   ADMISSION DIAGNOSIS:  Acute respiratory failure with hypoxia ([place]) [J96.01] Cholelithiasis [K80.20] Acute on chronic congestive heart failure, unspecified heart failure type ([place]) [I50.9] DISCHARGE DIAGNOSIS:  Active Problems:   Acute hypoxemic respiratory failure ([place])  SECONDARY DIAGNOSIS:   Past Medical History:  Diagnosis Date  . AAA (abdominal aortic aneurysm) ([place])    s/p stent in 2010  . Anxiety   . Arthritis   . CAD (coronary artery disease)   . Chronic kidney disease (CKD), stage III (moderate) (HCC)   . Chronic systolic congestive heart failure (HCC)    b.11/2018: EF < 20%, global hypokinesis w/ severe hypokinesis inflat wall, mod. dilated LA/LV a. 05/2016: EF 35-40%, mild LA/LVH, mod. MR  . Dementia ([place])   . Depression   . Diverticulitis    H/O  . GERD (gastroesophageal reflux disease)   . GI bleed    08/2010: GIB s/p dual antiplt therapy post stent  . Hx of migraines   . Hx: UTI (urinary tract infection)   . Hyperlipidemia   . Hypertension   . Kidney stones   . MI (myocardial infarction) ([place])    c. 05/2016: N-STEMI, b.03/2012: N-STEMI, a. 08/2010: N-STEMI w/ DES in LCX  . Renal cell carcinoma   . Thyroid disease    HOSPITAL COURSE:  85 year old female with multiple medical problems including hypertension, CAD, hypothyroidism, depression anxiety, AAA, CKD, CHF and dementia admitted to hospital secondary to abdominal pain.  1. Acute on chronic systolic CHF exacerbation-  -ECHO with EF <20% - net - 3.9 L for the admission.  -Salt restriction and fluid restriction. - Renal function improved from 1.48->1.16. Potassium improved from 3.2->3.9 -Continue  Coreg, statin and lisinopril - Weaned off to RA at D/C - No need for invasive cardiac intervention. Plan for conservative management per cardio  2. Paroxysmal afib - with low EF, high risk for stroke - not a candidate for long term anticoagulation given dementia, low dose coreg   3. Elevated troponins-likely demand ischemia not NSTEMI.  - Continue ASA, Lipitor. Will need to follow-up with cardiology for anticoagulation recommendations to transition at discharge.  4. Confusion and disorientation- secondary to underlying dementia.  CT of the head with chronic changes. UA negative for any infection  5. Lower abdominal pain-musculoskeletal pain in the left groin. -CT of the abdomen showing gallstones but no acute cholecystitis symptoms -Symptoms have resolved and patient is tolerating diet well.  6. HLD Per cardiology note: LDL of 171 from 07/2018 -In this setting, doubt she would be a candidate for PCSK-9 inhibitor  -Continue Lipitor DISCHARGE CONDITIONS:  stable CONSULTS OBTAINED:  Treatment Team:  Arta Silence, MD DRUG ALLERGIES:   Allergies  Allergen Reactions  . Tramadol Rash   DISCHARGE MEDICATIONS:   Allergies as of 12/14/2018      Reactions   Tramadol Rash      Medication List    TAKE these medications   aspirin 81 MG chewable tablet Chew 1 tablet (81 mg total) by mouth daily.   atorvastatin 40 MG tablet Commonly known as:  LIPITOR Take 1 tablet (40 mg total) by mouth daily at 6 PM.   carvedilol 3.125 MG tablet Commonly known  as:  COREG TAKE ONE TABLET TWICE DAILY WITH A MEAL   furosemide 20 MG tablet Commonly known as:  Lasix Take 1 tablet (20 mg total) by mouth daily.   lisinopril 2.5 MG tablet Commonly known as:  PRINIVIL,ZESTRIL Take 1 tablet (2.5 mg total) by mouth daily.      DISCHARGE INSTRUCTIONS:   DIET:  Regular diet DISCHARGE CONDITION:  Good ACTIVITY:  Activity as tolerated OXYGEN:  Home Oxygen: No.  Oxygen  Delivery: room air DISCHARGE LOCATION:  home with home health, PT - CHF protocol  If you experience worsening of your admission symptoms, develop shortness of breath, life threatening emergency, suicidal or homicidal thoughts you must seek medical attention immediately by calling 911 or calling your MD immediately  if symptoms less severe.  You Must read complete instructions/literature along with all the possible adverse reactions/side effects for all the Medicines you take and that have been prescribed to you. Take any new Medicines after you have completely understood and accpet all the possible adverse reactions/side effects.   Please note  You were cared for by a hospitalist during your hospital stay. If you have any questions about your discharge medications or the care you received while you were in the hospital after you are discharged, you can call the unit and asked to speak with the hospitalist on call if the hospitalist that took care of you is not available. Once you are discharged, your primary care physician will handle any further medical issues. Please note that NO REFILLS for any discharge medications will be authorized once you are discharged, as it is imperative that you return to your primary care physician (or establish a relationship with a primary care physician if you do not have one) for your aftercare needs so that they can reassess your need for medications and monitor your lab values.    On the day of Discharge:  VITAL SIGNS:  Blood pressure 136/77, pulse 78, temperature 98 F (36.7 C), temperature source Oral, resp. rate 18, height 5\' 4"  (1.626 m), weight 54.6 kg, SpO2 97 %. PHYSICAL EXAMINATION:  GENERAL:  85 y.o.-year-old patient lying in the bed with no acute distress.  EYES: Pupils equal, round, reactive to light and accommodation. No scleral icterus. Extraocular muscles intact.  HEENT: Head atraumatic, normocephalic. Oropharynx and nasopharynx clear.  NECK:   Supple, no jugular venous distention. No thyroid enlargement, no tenderness.  LUNGS: Normal breath sounds bilaterally, no wheezing, rales,rhonchi or crepitation. No use of accessory muscles of respiration.  CARDIOVASCULAR: S1, S2 normal. No murmurs, rubs, or gallops.  ABDOMEN: Soft, non-tender, non-distended. Bowel sounds present. No organomegaly or mass.  EXTREMITIES: No pedal edema, cyanosis, or clubbing.  NEUROLOGIC: Cranial nerves II through XII are intact. Muscle strength 5/5 in all extremities. Sensation intact. Gait not checked.  PSYCHIATRIC: The patient is alert and oriented x 3.  SKIN: No obvious rash, lesion, or ulcer.  DATA REVIEW:   CBC Recent Labs  Lab 12/14/18 0402  WBC 4.4  HGB 13.6  HCT 42.6  PLT 192    Chemistries  Recent Labs  Lab 12/14/18 0402  NA 134*  K 4.2  CL 106  CO2 20*  GLUCOSE 108*  BUN 27*  CREATININE 1.12*  CALCIUM 9.7     Follow-up Information    [name], MD. Schedule an appointment as soon as possible for a visit in 5 day(s).   Specialty:  Internal Medicine Contact information: [address] [place] [address] [phone]  Minna Merritts, MD. Schedule an appointment as soon as possible for a visit in 1 week(s).   Specialty:  Cardiology Contact information: [place] [address] [phone]            Management plans discussed with the patient, family and they are in agreement.  CODE STATUS: Prior   TOTAL TIME TAKING CARE OF THIS PATIENT: 45 minutes.    Max Sane M.D on 12/19/2018 at 12:44 PM  Between 7am to 6pm - Pager - [phone]  After 6pm go to [email or web address] - Proofreader  [facility]  Office  [phone]  CC: Primary care physician; [name], MD   Note: This dictation was prepared with Dragon dictation along with smaller phrase technology. Any transcriptional errors that result from this  process are unintentional.

## 2018-12-21 ENCOUNTER — Encounter: Payer: Self-pay | Admitting: Internal Medicine

## 2018-12-21 DIAGNOSIS — I480 Paroxysmal atrial fibrillation: Secondary | ICD-10-CM | POA: Insufficient documentation

## 2018-12-21 DIAGNOSIS — K8020 Calculus of gallbladder without cholecystitis without obstruction: Secondary | ICD-10-CM | POA: Insufficient documentation

## 2018-12-21 NOTE — Assessment & Plan Note (Signed)
Admitted recently as outlined.  Diuresed.  Oxygen weaned off.  Breathing improved.  Follow.  Continue current medication regimen.

## 2018-12-21 NOTE — Assessment & Plan Note (Signed)
On lipitor.  Follow lipid panel and liver function tests.   

## 2018-12-21 NOTE — Assessment & Plan Note (Signed)
Has seen Dr Manuella Ghazi.  Documented dementia.  On on medication.  Has desired no medication. Follow.

## 2018-12-21 NOTE — Assessment & Plan Note (Signed)
Not on medication now.  Overall stable.  Follow.

## 2018-12-21 NOTE — Assessment & Plan Note (Signed)
Discussed with pt and her daughter the importance of taking her medications on a regular basis.  Discussed assisted living.  Declines.  Planning to get private sitters.  Also planning home health.

## 2018-12-21 NOTE — Assessment & Plan Note (Signed)
Previously evaluated by vascular surgery.

## 2018-12-21 NOTE — Assessment & Plan Note (Signed)
afib in hospital.  Appears to be in regular rhythm now. Chronic anticoagulation not recommended secondary to her dementia, etc.  Is taking aspirin.

## 2018-12-21 NOTE — Assessment & Plan Note (Signed)
Blood pressure under good control.  Continue same medication regimen.  Follow pressures.  Follow metabolic panel.   

## 2018-12-21 NOTE — Assessment & Plan Note (Signed)
Has declined further evaluation.  Follow calcium.

## 2018-12-21 NOTE — Assessment & Plan Note (Signed)
Avoid antiinflammatories.  Renal function improved at discharge.  Follow metabolic panel.

## 2018-12-21 NOTE — Assessment & Plan Note (Signed)
Recently admitted with acute on chronic congestive heart failure.  Diuresed.  Medications restarted.  Discussed importance of taking her medications regularly.  Plan f/u with cardiology.

## 2018-12-21 NOTE — Assessment & Plan Note (Signed)
Found on CT.  No abdominal pain.  Follow.

## 2018-12-23 DIAGNOSIS — J9601 Acute respiratory failure with hypoxia: Secondary | ICD-10-CM

## 2018-12-23 DIAGNOSIS — Z87890 Personal history of sex reassignment: Secondary | ICD-10-CM | POA: Diagnosis not present

## 2018-12-23 DIAGNOSIS — Z87440 Personal history of urinary (tract) infections: Secondary | ICD-10-CM

## 2018-12-23 DIAGNOSIS — N183 Chronic kidney disease, stage 3 (moderate): Secondary | ICD-10-CM | POA: Diagnosis not present

## 2018-12-23 DIAGNOSIS — I130 Hypertensive heart and chronic kidney disease with heart failure and stage 1 through stage 4 chronic kidney disease, or unspecified chronic kidney disease: Secondary | ICD-10-CM | POA: Diagnosis not present

## 2018-12-23 DIAGNOSIS — I4891 Unspecified atrial fibrillation: Secondary | ICD-10-CM | POA: Diagnosis not present

## 2018-12-23 DIAGNOSIS — K5730 Diverticulosis of large intestine without perforation or abscess without bleeding: Secondary | ICD-10-CM | POA: Diagnosis not present

## 2018-12-23 DIAGNOSIS — J439 Emphysema, unspecified: Secondary | ICD-10-CM

## 2018-12-23 DIAGNOSIS — E785 Hyperlipidemia, unspecified: Secondary | ICD-10-CM | POA: Diagnosis not present

## 2018-12-23 DIAGNOSIS — K8020 Calculus of gallbladder without cholecystitis without obstruction: Secondary | ICD-10-CM | POA: Diagnosis not present

## 2018-12-23 DIAGNOSIS — E213 Hyperparathyroidism, unspecified: Secondary | ICD-10-CM | POA: Diagnosis not present

## 2018-12-23 DIAGNOSIS — I255 Ischemic cardiomyopathy: Secondary | ICD-10-CM | POA: Diagnosis not present

## 2018-12-23 DIAGNOSIS — G43909 Migraine, unspecified, not intractable, without status migrainosus: Secondary | ICD-10-CM

## 2018-12-23 DIAGNOSIS — I252 Old myocardial infarction: Secondary | ICD-10-CM | POA: Diagnosis not present

## 2018-12-23 DIAGNOSIS — Z7982 Long term (current) use of aspirin: Secondary | ICD-10-CM | POA: Diagnosis not present

## 2018-12-23 DIAGNOSIS — I2510 Atherosclerotic heart disease of native coronary artery without angina pectoris: Secondary | ICD-10-CM | POA: Diagnosis not present

## 2018-12-23 DIAGNOSIS — I5023 Acute on chronic systolic (congestive) heart failure: Secondary | ICD-10-CM | POA: Diagnosis not present

## 2018-12-23 DIAGNOSIS — F0390 Unspecified dementia without behavioral disturbance: Secondary | ICD-10-CM

## 2018-12-23 DIAGNOSIS — D259 Leiomyoma of uterus, unspecified: Secondary | ICD-10-CM | POA: Diagnosis not present

## 2018-12-23 DIAGNOSIS — Z9181 History of falling: Secondary | ICD-10-CM

## 2018-12-23 DIAGNOSIS — F419 Anxiety disorder, unspecified: Secondary | ICD-10-CM

## 2018-12-23 DIAGNOSIS — Z955 Presence of coronary angioplasty implant and graft: Secondary | ICD-10-CM | POA: Diagnosis not present

## 2018-12-23 DIAGNOSIS — Z905 Acquired absence of kidney: Secondary | ICD-10-CM | POA: Diagnosis not present

## 2018-12-23 DIAGNOSIS — M1990 Unspecified osteoarthritis, unspecified site: Secondary | ICD-10-CM | POA: Diagnosis not present

## 2018-12-23 DIAGNOSIS — D509 Iron deficiency anemia, unspecified: Secondary | ICD-10-CM | POA: Diagnosis not present

## 2018-12-23 DIAGNOSIS — Z8553 Personal history of malignant neoplasm of renal pelvis: Secondary | ICD-10-CM | POA: Diagnosis not present

## 2018-12-23 DIAGNOSIS — F331 Major depressive disorder, recurrent, moderate: Secondary | ICD-10-CM | POA: Diagnosis not present

## 2018-12-25 DIAGNOSIS — Z955 Presence of coronary angioplasty implant and graft: Secondary | ICD-10-CM | POA: Diagnosis not present

## 2018-12-25 DIAGNOSIS — D259 Leiomyoma of uterus, unspecified: Secondary | ICD-10-CM | POA: Diagnosis not present

## 2018-12-25 DIAGNOSIS — I255 Ischemic cardiomyopathy: Secondary | ICD-10-CM | POA: Diagnosis not present

## 2018-12-25 DIAGNOSIS — N183 Chronic kidney disease, stage 3 (moderate): Secondary | ICD-10-CM | POA: Diagnosis not present

## 2018-12-25 DIAGNOSIS — I130 Hypertensive heart and chronic kidney disease with heart failure and stage 1 through stage 4 chronic kidney disease, or unspecified chronic kidney disease: Secondary | ICD-10-CM | POA: Diagnosis not present

## 2018-12-25 DIAGNOSIS — J439 Emphysema, unspecified: Secondary | ICD-10-CM | POA: Diagnosis not present

## 2018-12-25 DIAGNOSIS — Z7982 Long term (current) use of aspirin: Secondary | ICD-10-CM | POA: Diagnosis not present

## 2018-12-25 DIAGNOSIS — F0390 Unspecified dementia without behavioral disturbance: Secondary | ICD-10-CM | POA: Diagnosis not present

## 2018-12-25 DIAGNOSIS — F331 Major depressive disorder, recurrent, moderate: Secondary | ICD-10-CM | POA: Diagnosis not present

## 2018-12-25 DIAGNOSIS — I4891 Unspecified atrial fibrillation: Secondary | ICD-10-CM | POA: Diagnosis not present

## 2018-12-25 DIAGNOSIS — I252 Old myocardial infarction: Secondary | ICD-10-CM | POA: Diagnosis not present

## 2018-12-25 DIAGNOSIS — F419 Anxiety disorder, unspecified: Secondary | ICD-10-CM | POA: Diagnosis not present

## 2018-12-25 DIAGNOSIS — E785 Hyperlipidemia, unspecified: Secondary | ICD-10-CM | POA: Diagnosis not present

## 2018-12-25 DIAGNOSIS — G43909 Migraine, unspecified, not intractable, without status migrainosus: Secondary | ICD-10-CM | POA: Diagnosis not present

## 2018-12-25 DIAGNOSIS — M1990 Unspecified osteoarthritis, unspecified site: Secondary | ICD-10-CM | POA: Diagnosis not present

## 2018-12-25 DIAGNOSIS — Z8553 Personal history of malignant neoplasm of renal pelvis: Secondary | ICD-10-CM | POA: Diagnosis not present

## 2018-12-25 DIAGNOSIS — K8020 Calculus of gallbladder without cholecystitis without obstruction: Secondary | ICD-10-CM | POA: Diagnosis not present

## 2018-12-25 DIAGNOSIS — I2510 Atherosclerotic heart disease of native coronary artery without angina pectoris: Secondary | ICD-10-CM | POA: Diagnosis not present

## 2018-12-25 DIAGNOSIS — Z87890 Personal history of sex reassignment: Secondary | ICD-10-CM | POA: Diagnosis not present

## 2018-12-25 DIAGNOSIS — D509 Iron deficiency anemia, unspecified: Secondary | ICD-10-CM | POA: Diagnosis not present

## 2018-12-25 DIAGNOSIS — Z905 Acquired absence of kidney: Secondary | ICD-10-CM | POA: Diagnosis not present

## 2018-12-25 DIAGNOSIS — I5023 Acute on chronic systolic (congestive) heart failure: Secondary | ICD-10-CM | POA: Diagnosis not present

## 2018-12-25 DIAGNOSIS — E213 Hyperparathyroidism, unspecified: Secondary | ICD-10-CM | POA: Diagnosis not present

## 2018-12-25 DIAGNOSIS — K5730 Diverticulosis of large intestine without perforation or abscess without bleeding: Secondary | ICD-10-CM | POA: Diagnosis not present

## 2018-12-26 ENCOUNTER — Telehealth: Payer: Self-pay | Admitting: Internal Medicine

## 2018-12-26 NOTE — Telephone Encounter (Signed)
Copied from [place] [id number]. Topic: Quick Communication - Home Health Verbal Orders >> Dec 26, 2018  3:17 PM Percell Belt A wrote: Caller/Agency: Wes [facility] Number: [phone] Requesting OT/PT/Skilled Nursing/Social Work/Speech Therapy: Requesting verbals for PT Frequency:1 week 3

## 2018-12-26 NOTE — Telephone Encounter (Signed)
Called and spoke to [place] w/ [facility].  Gave verbal order for PT as requested.

## 2018-12-27 ENCOUNTER — Other Ambulatory Visit: Payer: Self-pay | Admitting: Internal Medicine

## 2018-12-27 MED ORDER — ATORVASTATIN CALCIUM 40 MG PO TABS: 40.0000 mg | ORAL_TABLET | Freq: Every day | ORAL | 5 refills | Status: AC

## 2018-12-27 MED ORDER — CARVEDILOL 3.125 MG PO TABS: ORAL_TABLET | ORAL | 5 refills | Status: AC

## 2018-12-27 MED ORDER — FUROSEMIDE 20 MG PO TABS: 20.0000 mg | ORAL_TABLET | Freq: Every day | ORAL | 11 refills | Status: AC

## 2018-12-27 MED ORDER — LISINOPRIL 2.5 MG PO TABS: 2.5000 mg | ORAL_TABLET | Freq: Every day | ORAL | 0 refills | Status: DC

## 2018-12-30 DIAGNOSIS — Z955 Presence of coronary angioplasty implant and graft: Secondary | ICD-10-CM | POA: Diagnosis not present

## 2018-12-30 DIAGNOSIS — D259 Leiomyoma of uterus, unspecified: Secondary | ICD-10-CM | POA: Diagnosis not present

## 2018-12-30 DIAGNOSIS — G43909 Migraine, unspecified, not intractable, without status migrainosus: Secondary | ICD-10-CM | POA: Diagnosis not present

## 2018-12-30 DIAGNOSIS — M1990 Unspecified osteoarthritis, unspecified site: Secondary | ICD-10-CM | POA: Diagnosis not present

## 2018-12-30 DIAGNOSIS — Z905 Acquired absence of kidney: Secondary | ICD-10-CM | POA: Diagnosis not present

## 2018-12-30 DIAGNOSIS — I130 Hypertensive heart and chronic kidney disease with heart failure and stage 1 through stage 4 chronic kidney disease, or unspecified chronic kidney disease: Secondary | ICD-10-CM | POA: Diagnosis not present

## 2018-12-30 DIAGNOSIS — Z87890 Personal history of sex reassignment: Secondary | ICD-10-CM | POA: Diagnosis not present

## 2018-12-30 DIAGNOSIS — I5023 Acute on chronic systolic (congestive) heart failure: Secondary | ICD-10-CM | POA: Diagnosis not present

## 2018-12-30 DIAGNOSIS — J439 Emphysema, unspecified: Secondary | ICD-10-CM | POA: Diagnosis not present

## 2018-12-30 DIAGNOSIS — Z7982 Long term (current) use of aspirin: Secondary | ICD-10-CM | POA: Diagnosis not present

## 2018-12-30 DIAGNOSIS — E785 Hyperlipidemia, unspecified: Secondary | ICD-10-CM | POA: Diagnosis not present

## 2018-12-30 DIAGNOSIS — D509 Iron deficiency anemia, unspecified: Secondary | ICD-10-CM | POA: Diagnosis not present

## 2018-12-30 DIAGNOSIS — I2510 Atherosclerotic heart disease of native coronary artery without angina pectoris: Secondary | ICD-10-CM | POA: Diagnosis not present

## 2018-12-30 DIAGNOSIS — F0390 Unspecified dementia without behavioral disturbance: Secondary | ICD-10-CM | POA: Diagnosis not present

## 2018-12-30 DIAGNOSIS — I255 Ischemic cardiomyopathy: Secondary | ICD-10-CM | POA: Diagnosis not present

## 2018-12-30 DIAGNOSIS — E213 Hyperparathyroidism, unspecified: Secondary | ICD-10-CM | POA: Diagnosis not present

## 2018-12-30 DIAGNOSIS — I4891 Unspecified atrial fibrillation: Secondary | ICD-10-CM | POA: Diagnosis not present

## 2018-12-30 DIAGNOSIS — Z8553 Personal history of malignant neoplasm of renal pelvis: Secondary | ICD-10-CM | POA: Diagnosis not present

## 2018-12-30 DIAGNOSIS — K5730 Diverticulosis of large intestine without perforation or abscess without bleeding: Secondary | ICD-10-CM | POA: Diagnosis not present

## 2018-12-30 DIAGNOSIS — K8020 Calculus of gallbladder without cholecystitis without obstruction: Secondary | ICD-10-CM | POA: Diagnosis not present

## 2018-12-30 DIAGNOSIS — I252 Old myocardial infarction: Secondary | ICD-10-CM | POA: Diagnosis not present

## 2018-12-30 DIAGNOSIS — F331 Major depressive disorder, recurrent, moderate: Secondary | ICD-10-CM | POA: Diagnosis not present

## 2018-12-30 DIAGNOSIS — F419 Anxiety disorder, unspecified: Secondary | ICD-10-CM | POA: Diagnosis not present

## 2018-12-30 DIAGNOSIS — N183 Chronic kidney disease, stage 3 (moderate): Secondary | ICD-10-CM | POA: Diagnosis not present

## 2018-12-31 DIAGNOSIS — N183 Chronic kidney disease, stage 3 (moderate): Secondary | ICD-10-CM | POA: Diagnosis not present

## 2018-12-31 DIAGNOSIS — Z7982 Long term (current) use of aspirin: Secondary | ICD-10-CM | POA: Diagnosis not present

## 2018-12-31 DIAGNOSIS — M1990 Unspecified osteoarthritis, unspecified site: Secondary | ICD-10-CM | POA: Diagnosis not present

## 2018-12-31 DIAGNOSIS — I252 Old myocardial infarction: Secondary | ICD-10-CM | POA: Diagnosis not present

## 2018-12-31 DIAGNOSIS — I2510 Atherosclerotic heart disease of native coronary artery without angina pectoris: Secondary | ICD-10-CM | POA: Diagnosis not present

## 2018-12-31 DIAGNOSIS — I4891 Unspecified atrial fibrillation: Secondary | ICD-10-CM | POA: Diagnosis not present

## 2018-12-31 DIAGNOSIS — Z87890 Personal history of sex reassignment: Secondary | ICD-10-CM | POA: Diagnosis not present

## 2018-12-31 DIAGNOSIS — G43909 Migraine, unspecified, not intractable, without status migrainosus: Secondary | ICD-10-CM | POA: Diagnosis not present

## 2018-12-31 DIAGNOSIS — E213 Hyperparathyroidism, unspecified: Secondary | ICD-10-CM | POA: Diagnosis not present

## 2018-12-31 DIAGNOSIS — I130 Hypertensive heart and chronic kidney disease with heart failure and stage 1 through stage 4 chronic kidney disease, or unspecified chronic kidney disease: Secondary | ICD-10-CM | POA: Diagnosis not present

## 2018-12-31 DIAGNOSIS — Z905 Acquired absence of kidney: Secondary | ICD-10-CM | POA: Diagnosis not present

## 2018-12-31 DIAGNOSIS — Z955 Presence of coronary angioplasty implant and graft: Secondary | ICD-10-CM | POA: Diagnosis not present

## 2018-12-31 DIAGNOSIS — E785 Hyperlipidemia, unspecified: Secondary | ICD-10-CM | POA: Diagnosis not present

## 2018-12-31 DIAGNOSIS — D259 Leiomyoma of uterus, unspecified: Secondary | ICD-10-CM | POA: Diagnosis not present

## 2018-12-31 DIAGNOSIS — J439 Emphysema, unspecified: Secondary | ICD-10-CM | POA: Diagnosis not present

## 2018-12-31 DIAGNOSIS — K8020 Calculus of gallbladder without cholecystitis without obstruction: Secondary | ICD-10-CM | POA: Diagnosis not present

## 2018-12-31 DIAGNOSIS — I255 Ischemic cardiomyopathy: Secondary | ICD-10-CM | POA: Diagnosis not present

## 2018-12-31 DIAGNOSIS — F419 Anxiety disorder, unspecified: Secondary | ICD-10-CM | POA: Diagnosis not present

## 2018-12-31 DIAGNOSIS — K5730 Diverticulosis of large intestine without perforation or abscess without bleeding: Secondary | ICD-10-CM | POA: Diagnosis not present

## 2018-12-31 DIAGNOSIS — I5023 Acute on chronic systolic (congestive) heart failure: Secondary | ICD-10-CM | POA: Diagnosis not present

## 2018-12-31 DIAGNOSIS — F331 Major depressive disorder, recurrent, moderate: Secondary | ICD-10-CM | POA: Diagnosis not present

## 2018-12-31 DIAGNOSIS — D509 Iron deficiency anemia, unspecified: Secondary | ICD-10-CM | POA: Diagnosis not present

## 2018-12-31 DIAGNOSIS — F0390 Unspecified dementia without behavioral disturbance: Secondary | ICD-10-CM | POA: Diagnosis not present

## 2018-12-31 DIAGNOSIS — Z8553 Personal history of malignant neoplasm of renal pelvis: Secondary | ICD-10-CM | POA: Diagnosis not present

## 2019-01-02 ENCOUNTER — Telehealth: Payer: Self-pay

## 2019-01-02 NOTE — Telephone Encounter (Signed)
Virtual Visit Pre-Appointment Phone Call  Steps For Call:  1. Confirm consent - "In the setting of the current Covid19 crisis, you are scheduled for a (phone or video) visit with your provider on  at January 16, 2019 at 9:30AM.  Just as we do with many in-office visits, in order for you to participate in this visit, we must obtain consent.  If you'd like, I can send this to your mychart (if signed up) or email for you to review.  Otherwise, I can obtain your verbal consent now.  All virtual visits are billed to your insurance company just like a normal visit would be.  By agreeing to a virtual visit, we'd like you to understand that the technology does not allow for your provider to perform an examination, and thus may limit your provider's ability to fully assess your condition.  Finally, though the technology is pretty good, we cannot assure that it will always work on either your or our end, and in the setting of a video visit, we may have to convert it to a phone-only visit.  In either situation, we cannot ensure that we have a secure connection.  Are you willing to proceed?"  2. Give patient instructions for WebEx download to smartphone as below if video visit  3. Advise patient to be prepared with any vital sign or heart rhythm information, their current medicines, and a piece of paper and pen handy for any instructions they may receive the day of their visit  4. Inform patient they will receive a phone call 15 minutes prior to their appointment time (may be from unknown caller ID) so they should be prepared to answer  5. Confirm that appointment type is correct in Epic appointment notes (video vs telephone)    TELEPHONE CALL NOTE  Karen Collins has been deemed a candidate for a follow-up tele-health visit to limit community exposure during the Covid-19 pandemic. I spoke with the patient via phone to ensure availability of phone/video source, confirm preferred email & phone number,  and discuss instructions and expectations.  I reminded Karen Collins to be prepared with any vital sign and/or heart rhythm information that could potentially be obtained via home monitoring, at the time of her visit. I reminded Karen Collins to expect a phone call at the time of her visit if her visit.  Did the patient verbally acknowledge consent to treatment? YES  Karen Collins 01/02/2019 10:00 AM   CONSENT FOR TELE-HEALTH VISIT - PLEASE REVIEW  I hereby voluntarily request, consent and authorize [facility] and its employed or contracted physicians, physician assistants, nurse practitioners or other licensed health care professionals (the Practitioner), to provide me with telemedicine health care services (the Services") as deemed necessary by the treating Practitioner. I acknowledge and consent to receive the Services by the Practitioner via telemedicine. I understand that the telemedicine visit will involve communicating with the Practitioner through live audiovisual communication technology and the disclosure of certain medical information by electronic transmission. I acknowledge that I have been given the opportunity to request an in-person assessment or other available alternative prior to the telemedicine visit and am voluntarily participating in the telemedicine visit.  I understand that I have the right to withhold or withdraw my consent to the use of telemedicine in the course of my care at any time, without affecting my right to future care or treatment, and that the Practitioner or I may terminate the telemedicine visit at  any time. I understand that I have the right to inspect all information obtained and/or recorded in the course of the telemedicine visit and may receive copies of available information for a reasonable fee.  I understand that some of the potential risks of receiving the Services via telemedicine include:   Delay or interruption in medical  evaluation due to technological equipment failure or disruption;  Information transmitted may not be sufficient (e.g. poor resolution of images) to allow for appropriate medical decision making by the Practitioner; and/or   In rare instances, security protocols could fail, causing a breach of personal health information.  Furthermore, I acknowledge that it is my responsibility to provide information about my medical history, conditions and care that is complete and accurate to the best of my ability. I acknowledge that Practitioner's advice, recommendations, and/or decision may be based on factors not within their control, such as incomplete or inaccurate data provided by me or distortions of diagnostic images or specimens that may result from electronic transmissions. I understand that the practice of medicine is not an exact science and that Practitioner makes no warranties or guarantees regarding treatment outcomes. I acknowledge that I will receive a copy of this consent concurrently upon execution via email to the email address I last provided but may also request a printed copy by calling the office of [place].    I understand that my insurance will be billed for this visit.   I have read or had this consent read to me.  I understand the contents of this consent, which adequately explains the benefits and risks of the Services being provided via telemedicine.   I have been provided ample opportunity to ask questions regarding this consent and the Services and have had my questions answered to my satisfaction.  I give my informed consent for the services to be provided through the use of telemedicine in my medical care  By participating in this telemedicine visit I agree to the above.

## 2019-01-03 ENCOUNTER — Other Ambulatory Visit: Payer: Self-pay | Admitting: Internal Medicine

## 2019-01-05 ENCOUNTER — Telehealth: Payer: Self-pay

## 2019-01-05 NOTE — Telephone Encounter (Signed)
Copied from [place] [id number]. Topic: Appointment Scheduling - Scheduling Inquiry for Clinic >> Jan 05, 2019 11:05 AM Nils Flack wrote: Reason for CRM: pt would like to cancel appt for 04/13 she will call back later to reschedule Thanks

## 2019-01-05 NOTE — Telephone Encounter (Signed)
Copied from [place] [id number]. Topic: General - Other >> Jan 05, 2019 11:11 AM Yvette Rack wrote: Reason for CRM: Santiago Glad with [facility] called to request a list of patient allergies. Santiago Glad stated she spoke with patient's daughter but she only could tell her that her mother is allergic to some things but did not know everything patient is allergic to. Santiago Glad requests call back [phone]

## 2019-01-05 NOTE — Telephone Encounter (Signed)
Advised the only allergy we have listed is Tramadol

## 2019-01-06 DIAGNOSIS — N183 Chronic kidney disease, stage 3 (moderate): Secondary | ICD-10-CM | POA: Diagnosis not present

## 2019-01-06 DIAGNOSIS — Z87890 Personal history of sex reassignment: Secondary | ICD-10-CM | POA: Diagnosis not present

## 2019-01-06 DIAGNOSIS — F0390 Unspecified dementia without behavioral disturbance: Secondary | ICD-10-CM | POA: Diagnosis not present

## 2019-01-06 DIAGNOSIS — I130 Hypertensive heart and chronic kidney disease with heart failure and stage 1 through stage 4 chronic kidney disease, or unspecified chronic kidney disease: Secondary | ICD-10-CM | POA: Diagnosis not present

## 2019-01-06 DIAGNOSIS — I4891 Unspecified atrial fibrillation: Secondary | ICD-10-CM | POA: Diagnosis not present

## 2019-01-06 DIAGNOSIS — F419 Anxiety disorder, unspecified: Secondary | ICD-10-CM | POA: Diagnosis not present

## 2019-01-06 DIAGNOSIS — Z955 Presence of coronary angioplasty implant and graft: Secondary | ICD-10-CM | POA: Diagnosis not present

## 2019-01-06 DIAGNOSIS — M1990 Unspecified osteoarthritis, unspecified site: Secondary | ICD-10-CM | POA: Diagnosis not present

## 2019-01-06 DIAGNOSIS — K8020 Calculus of gallbladder without cholecystitis without obstruction: Secondary | ICD-10-CM | POA: Diagnosis not present

## 2019-01-06 DIAGNOSIS — I255 Ischemic cardiomyopathy: Secondary | ICD-10-CM | POA: Diagnosis not present

## 2019-01-06 DIAGNOSIS — E213 Hyperparathyroidism, unspecified: Secondary | ICD-10-CM | POA: Diagnosis not present

## 2019-01-06 DIAGNOSIS — Z905 Acquired absence of kidney: Secondary | ICD-10-CM | POA: Diagnosis not present

## 2019-01-06 DIAGNOSIS — F331 Major depressive disorder, recurrent, moderate: Secondary | ICD-10-CM | POA: Diagnosis not present

## 2019-01-06 DIAGNOSIS — Z7982 Long term (current) use of aspirin: Secondary | ICD-10-CM | POA: Diagnosis not present

## 2019-01-06 DIAGNOSIS — E785 Hyperlipidemia, unspecified: Secondary | ICD-10-CM | POA: Diagnosis not present

## 2019-01-06 DIAGNOSIS — D509 Iron deficiency anemia, unspecified: Secondary | ICD-10-CM | POA: Diagnosis not present

## 2019-01-06 DIAGNOSIS — I2510 Atherosclerotic heart disease of native coronary artery without angina pectoris: Secondary | ICD-10-CM | POA: Diagnosis not present

## 2019-01-06 DIAGNOSIS — D259 Leiomyoma of uterus, unspecified: Secondary | ICD-10-CM | POA: Diagnosis not present

## 2019-01-06 DIAGNOSIS — J439 Emphysema, unspecified: Secondary | ICD-10-CM | POA: Diagnosis not present

## 2019-01-06 DIAGNOSIS — G43909 Migraine, unspecified, not intractable, without status migrainosus: Secondary | ICD-10-CM | POA: Diagnosis not present

## 2019-01-06 DIAGNOSIS — K5730 Diverticulosis of large intestine without perforation or abscess without bleeding: Secondary | ICD-10-CM | POA: Diagnosis not present

## 2019-01-06 DIAGNOSIS — I5023 Acute on chronic systolic (congestive) heart failure: Secondary | ICD-10-CM | POA: Diagnosis not present

## 2019-01-06 DIAGNOSIS — I252 Old myocardial infarction: Secondary | ICD-10-CM | POA: Diagnosis not present

## 2019-01-06 DIAGNOSIS — Z8553 Personal history of malignant neoplasm of renal pelvis: Secondary | ICD-10-CM | POA: Diagnosis not present

## 2019-01-07 ENCOUNTER — Telehealth: Payer: Self-pay | Admitting: Internal Medicine

## 2019-01-07 NOTE — Telephone Encounter (Signed)
Copied from [place] [phone]. Topic: Quick Communication - See Telephone Encounter >> Jan 07, 2019  4:22 PM Rutherford Nail, NT wrote: CRM for notification. See Telephone encounter for: 01/07/19. Shelton Silvas with [facility] calling and states that the patient's family is wanting the pharmacy to pill pack the patient's medications. States that she needs a current medication list faxed over because there are some conflicting things on the list she has. Please advise.  Fax#: [place]

## 2019-01-08 MED ORDER — LISINOPRIL 2.5 MG PO TABS: 2.5000 mg | ORAL_TABLET | Freq: Every day | ORAL | 2 refills | Status: AC

## 2019-01-08 NOTE — Telephone Encounter (Signed)
Called pharmacy to clarify what is needed. Richardson Landry at [facility] stated that a prescription for Ramipril 1.25 mg q day was sent in instead of her Lisinopril 2.5 mg q day. I do not see any documentation in the chart where the medication should've been changed. She has been on Lisinopril long term and has tolerated well. Ok to send in a prescription for Lisinopril 2.5 mg?

## 2019-01-08 NOTE — Telephone Encounter (Signed)
Patients med list updated. Rx for Lisinopril sent in. Ramipril d/c'd. Med list faxed to [facility]

## 2019-01-08 NOTE — Telephone Encounter (Signed)
Need to continue on lisinopril 2.5mg  q day and not ramipril.  Please cancel ramipril order.

## 2019-01-08 NOTE — Telephone Encounter (Signed)
Attempted to reach Karen Collins for more information. Shelton Silvas was not in yet

## 2019-01-12 ENCOUNTER — Ambulatory Visit: Payer: Self-pay | Admitting: Internal Medicine

## 2019-01-16 ENCOUNTER — Telehealth: Payer: Self-pay | Admitting: Physician Assistant

## 2019-01-20 DIAGNOSIS — G43909 Migraine, unspecified, not intractable, without status migrainosus: Secondary | ICD-10-CM | POA: Diagnosis not present

## 2019-01-20 DIAGNOSIS — K8020 Calculus of gallbladder without cholecystitis without obstruction: Secondary | ICD-10-CM | POA: Diagnosis not present

## 2019-01-20 DIAGNOSIS — F0390 Unspecified dementia without behavioral disturbance: Secondary | ICD-10-CM | POA: Diagnosis not present

## 2019-01-20 DIAGNOSIS — I130 Hypertensive heart and chronic kidney disease with heart failure and stage 1 through stage 4 chronic kidney disease, or unspecified chronic kidney disease: Secondary | ICD-10-CM | POA: Diagnosis not present

## 2019-01-20 DIAGNOSIS — Z955 Presence of coronary angioplasty implant and graft: Secondary | ICD-10-CM | POA: Diagnosis not present

## 2019-01-20 DIAGNOSIS — Z87890 Personal history of sex reassignment: Secondary | ICD-10-CM | POA: Diagnosis not present

## 2019-01-20 DIAGNOSIS — I255 Ischemic cardiomyopathy: Secondary | ICD-10-CM | POA: Diagnosis not present

## 2019-01-20 DIAGNOSIS — F419 Anxiety disorder, unspecified: Secondary | ICD-10-CM | POA: Diagnosis not present

## 2019-01-20 DIAGNOSIS — E213 Hyperparathyroidism, unspecified: Secondary | ICD-10-CM | POA: Diagnosis not present

## 2019-01-20 DIAGNOSIS — K5730 Diverticulosis of large intestine without perforation or abscess without bleeding: Secondary | ICD-10-CM | POA: Diagnosis not present

## 2019-01-20 DIAGNOSIS — I4891 Unspecified atrial fibrillation: Secondary | ICD-10-CM | POA: Diagnosis not present

## 2019-01-20 DIAGNOSIS — M1990 Unspecified osteoarthritis, unspecified site: Secondary | ICD-10-CM | POA: Diagnosis not present

## 2019-01-20 DIAGNOSIS — E785 Hyperlipidemia, unspecified: Secondary | ICD-10-CM | POA: Diagnosis not present

## 2019-01-20 DIAGNOSIS — I5023 Acute on chronic systolic (congestive) heart failure: Secondary | ICD-10-CM | POA: Diagnosis not present

## 2019-01-20 DIAGNOSIS — Z7982 Long term (current) use of aspirin: Secondary | ICD-10-CM | POA: Diagnosis not present

## 2019-01-20 DIAGNOSIS — I252 Old myocardial infarction: Secondary | ICD-10-CM | POA: Diagnosis not present

## 2019-01-20 DIAGNOSIS — F331 Major depressive disorder, recurrent, moderate: Secondary | ICD-10-CM | POA: Diagnosis not present

## 2019-01-20 DIAGNOSIS — J439 Emphysema, unspecified: Secondary | ICD-10-CM | POA: Diagnosis not present

## 2019-01-20 DIAGNOSIS — Z905 Acquired absence of kidney: Secondary | ICD-10-CM | POA: Diagnosis not present

## 2019-01-20 DIAGNOSIS — D509 Iron deficiency anemia, unspecified: Secondary | ICD-10-CM | POA: Diagnosis not present

## 2019-01-20 DIAGNOSIS — D259 Leiomyoma of uterus, unspecified: Secondary | ICD-10-CM | POA: Diagnosis not present

## 2019-01-20 DIAGNOSIS — Z8553 Personal history of malignant neoplasm of renal pelvis: Secondary | ICD-10-CM | POA: Diagnosis not present

## 2019-01-20 DIAGNOSIS — I2510 Atherosclerotic heart disease of native coronary artery without angina pectoris: Secondary | ICD-10-CM | POA: Diagnosis not present

## 2019-01-20 DIAGNOSIS — N183 Chronic kidney disease, stage 3 (moderate): Secondary | ICD-10-CM | POA: Diagnosis not present

## 2019-01-30 ENCOUNTER — Telehealth: Payer: Self-pay | Admitting: Internal Medicine

## 2019-01-30 NOTE — Telephone Encounter (Signed)
Copied from [place] [phone]. Topic: Quick Communication - See Telephone Encounter >> Jan 30, 2019  2:00 PM Sheran Luz wrote: CRM for notification. See Telephone encounter for: 01/30/19.   Jenny Reichmann, with [facility], calling as FYI. She states that patients speech eval will be next week as authorization just came through to them yesterday evening. She can be contacted if there are any questions.

## 2019-01-30 NOTE — Telephone Encounter (Signed)
FYI

## 2019-02-03 ENCOUNTER — Telehealth: Payer: Self-pay | Admitting: Internal Medicine

## 2019-02-03 NOTE — Telephone Encounter (Unsigned)
Copied from [place] [phone]. Topic: Quick Communication - Home Health Verbal Orders >> Feb 03, 2019 10:35 AM Yvette Rack wrote: Caller/Agency: Flossie Buffy with [facility] Number: [phone] Requesting OT/PT/Skilled Nursing/Social Work/Speech Therapy: Speech Therapy Frequency: 1 time a week for 1 week and 2 times a week for 1 week to address cognition

## 2019-02-03 NOTE — Telephone Encounter (Signed)
Verbals given  

## 2019-02-05 ENCOUNTER — Ambulatory Visit (INDEPENDENT_AMBULATORY_CARE_PROVIDER_SITE_OTHER): Payer: PPO | Admitting: Internal Medicine

## 2019-02-05 ENCOUNTER — Other Ambulatory Visit: Payer: Self-pay

## 2019-02-05 ENCOUNTER — Encounter: Payer: Self-pay | Admitting: Internal Medicine

## 2019-02-05 DIAGNOSIS — I5022 Chronic systolic (congestive) heart failure: Secondary | ICD-10-CM | POA: Diagnosis not present

## 2019-02-05 DIAGNOSIS — K838 Other specified diseases of biliary tract: Secondary | ICD-10-CM | POA: Diagnosis not present

## 2019-02-05 DIAGNOSIS — I10 Essential (primary) hypertension: Secondary | ICD-10-CM | POA: Diagnosis not present

## 2019-02-05 DIAGNOSIS — N183 Chronic kidney disease, stage 3 unspecified: Secondary | ICD-10-CM

## 2019-02-05 DIAGNOSIS — I2510 Atherosclerotic heart disease of native coronary artery without angina pectoris: Secondary | ICD-10-CM | POA: Diagnosis not present

## 2019-02-05 DIAGNOSIS — I714 Abdominal aortic aneurysm, without rupture, unspecified: Secondary | ICD-10-CM

## 2019-02-05 DIAGNOSIS — I6529 Occlusion and stenosis of unspecified carotid artery: Secondary | ICD-10-CM | POA: Diagnosis not present

## 2019-02-05 DIAGNOSIS — D509 Iron deficiency anemia, unspecified: Secondary | ICD-10-CM

## 2019-02-05 DIAGNOSIS — I429 Cardiomyopathy, unspecified: Secondary | ICD-10-CM | POA: Diagnosis not present

## 2019-02-05 DIAGNOSIS — E7800 Pure hypercholesterolemia, unspecified: Secondary | ICD-10-CM

## 2019-02-05 DIAGNOSIS — R935 Abnormal findings on diagnostic imaging of other abdominal regions, including retroperitoneum: Secondary | ICD-10-CM | POA: Diagnosis not present

## 2019-02-05 NOTE — Progress Notes (Signed)
Patient ID: Karen Collins, female   DOB: 01-22-1934, 85 y.o.   MRN: [id number]   Virtual Visit via Telephone Note  This visit type was conducted due to national recommendations for restrictions regarding the COVID-19 pandemic (e.g. social distancing).  This format is felt to be most appropriate for this patient at this time.  All issues noted in this document were discussed and addressed.  No physical exam was performed (except for noted visual exam findings with Video Visits).   I connected with [employer] by telephone and verified that I am speaking with the correct person using two identifiers. Location patient: home Location provider: work Persons participating in the telephone visit: patient, provider and caregiver Denman George).  I also called and spoke with pts daughter Sharyn Lull) - in [place] after visit.  (unable to connect in with her during the visit).    I discussed the limitations, risks, security and privacy concerns of performing an evaluation and management service by telephone and the availability of in person appointments. The patient expressed understanding and agreed to proceed.   Reason for visit: scheduled follow up.   HPI: Pt reports she is doing well. Denman George is taking her for walks, etc.  Breathing appears to be ok with walking.  Pt denies any increased sob or pain.  Staying in.  No fever. No cough or chest congestion.  No acid reflux.  No abdominal pain.  Bowels moving.  Was admitted 3/11- 12/14/18 with acute respiratory failure and CHF.  ECHO - 20% EF.  She had not been taking her medication prior to that hospitalization.  Has pill packs now.  Appears to be taking her medication.  Discussed CT findings (performed while pt in hospital) and pt and daughter desire no further testing at this time.  Weight staying stable - 120-121 pounds.  Overall she feels good and appears to be doing better.  Memory still an issue.     ROS: See pertinent positives and negatives per  HPI.  Past Medical History:  Diagnosis Date  . AAA (abdominal aortic aneurysm) ([place])    s/p stent in 2010  . Anxiety   . Arthritis   . CAD (coronary artery disease)   . Chronic kidney disease (CKD), stage III (moderate) (HCC)   . Chronic systolic congestive heart failure (HCC)    b.11/2018: EF < 20%, global hypokinesis w/ severe hypokinesis inflat wall, mod. dilated LA/LV a. 05/2016: EF 35-40%, mild LA/LVH, mod. MR  . Dementia ([place])   . Depression   . Diverticulitis    H/O  . GERD (gastroesophageal reflux disease)   . GI bleed    08/2010: GIB s/p dual antiplt therapy post stent  . Hx of migraines   . Hx: UTI (urinary tract infection)   . Hyperlipidemia   . Hypertension   . Kidney stones   . MI (myocardial infarction) ([place])    c. 05/2016: N-STEMI, b.03/2012: N-STEMI, a. 08/2010: N-STEMI w/ DES in LCX  . Renal cell carcinoma   . Thyroid disease     Past Surgical History:  Procedure Laterality Date  . ABDOMINAL AORTIC ANEURYSM REPAIR     s/p stent  . ABDOMINAL AORTIC ENDOVASCULAR STENT GRAFT    . CARDIAC CATHETERIZATION    . CARDIAC CATHETERIZATION  09/18/2010  . CHOLECYSTECTOMY    . CORONARY STENT PLACEMENT  09/18/2010   CAD; MI s/p stent  . LAPAROSCOPIC PARTIAL NEPHRECTOMY  2010  . NEPHRECTOMY  10/2008   Right  . PARATHYROIDECTOMY    .  SHOULDER SURGERY  07/2010   Right  . THYROIDECTOMY      Family History  Problem Relation Age of Onset  . Arthritis Mother   . Heart disease Mother   . Pulmonary fibrosis Mother   . Heart disease Father   . Hodgkin's lymphoma Father   . Breast cancer Sister   . Hyperlipidemia Sister     SOCIAL HX: reviewed.    Current Outpatient Medications:  .  aspirin 81 MG chewable tablet, Chew 1 tablet (81 mg total) by mouth daily., Disp: 30 tablet, Rfl: 0 .  atorvastatin (LIPITOR) 40 MG tablet, Take 1 tablet (40 mg total) by mouth daily at 6 PM., Disp: 30 tablet, Rfl: 5 .  carvedilol (COREG) 3.125 MG tablet, TAKE ONE TABLET TWICE DAILY  WITH A MEAL, Disp: 60 tablet, Rfl: 5 .  furosemide (LASIX) 20 MG tablet, Take 1 tablet (20 mg total) by mouth daily., Disp: 30 tablet, Rfl: 11 .  lisinopril (PRINIVIL,ZESTRIL) 2.5 MG tablet, Take 1 tablet (2.5 mg total) by mouth daily., Disp: 30 tablet, Rfl: 2  EXAM:  VITALS per patient if applicable: weight 546-270 pounds.    GENERAL: alert.  Sounds to be in no acute distress.  Answering questions appropriately.    PSYCH/NEURO: pleasant and cooperative, no obvious depression or anxiety, speech and thought processing grossly intact  ASSESSMENT AND PLAN:  Discussed the following assessment and plan:  Abdominal aortic aneurysm (AAA) without rupture (HCC)  Iron deficiency anemia, unspecified iron deficiency anemia type  Stenosis of carotid artery, unspecified laterality  Chronic systolic heart failure (HCC)  CKD (chronic kidney disease), stage III (HCC)  Atherosclerosis of native coronary artery of native heart without angina pectoris  Essential hypertension  Hypercholesterolemia  Cardiomyopathy, unspecified type ([place])  Common bile duct dilatation  Abnormal CT of the abdomen  AAA (abdominal aortic aneurysm) Has been seen by vascular surgery previously.  Declines further testing at this time.    Anemia, iron deficiency Follow cbc.   Carotid stenosis Has been evaluated by [facility] and cardiology.  Desires no further testing at this time.  Follow.    Chronic systolic heart failure (HCC) Stable on current regimen.  Follow.    CKD (chronic kidney disease), stage III Avoid antiinflammatories.  Follow renal function.    Coronary atherosclerosis Has seen Dr Rockey Situ.  Continue risk factor modification.  Currently without symptoms.    HTN (hypertension) Encouraged to take her medications regularly.  Follow pressures.    Hypercholesterolemia On lipitor.  Follow lipid panel and liver function tests.    Cardiomyopathy ([place]) Currently reports breathing stable.  No increased  sob.  Continue current medication regimen.  Follow.    Common bile duct dilatation Noted on recent CT.  Discussed with pt and pts daughter.  Desire no further w/up at this time.  Eating.  No nausea or vomiting.  No abdominal pain.  Reports weight stable.    Abnormal CT of the abdomen Abnormal CT chest/abdomen. Discussed with pt and her daughter.  Desires no further testing at this time.  Currently doing well.      I discussed the assessment and treatment plan with the patient. The patient was provided an opportunity to ask questions and all were answered. The patient agreed with the plan and demonstrated an understanding of the instructions.   The patient was advised to call back or seek an in-person evaluation if the symptoms worsen or if the condition fails to improve as anticipated.  I provided 25 minutes of  non-face-to-face time during this encounter.   [name], MD

## 2019-02-08 ENCOUNTER — Encounter: Payer: Self-pay | Admitting: Internal Medicine

## 2019-02-08 DIAGNOSIS — K838 Other specified diseases of biliary tract: Secondary | ICD-10-CM | POA: Insufficient documentation

## 2019-02-08 DIAGNOSIS — R935 Abnormal findings on diagnostic imaging of other abdominal regions, including retroperitoneum: Secondary | ICD-10-CM | POA: Insufficient documentation

## 2019-02-08 NOTE — Assessment & Plan Note (Signed)
Noted on recent CT.  Discussed with pt and pts daughter.  Desire no further w/up at this time.  Eating.  No nausea or vomiting.  No abdominal pain.  Reports weight stable.

## 2019-02-08 NOTE — Assessment & Plan Note (Signed)
On lipitor.  Follow lipid panel and liver function tests.   

## 2019-02-08 NOTE — Assessment & Plan Note (Signed)
Has been evaluated by [facility] and cardiology.  Desires no further testing at this time.  Follow.

## 2019-02-08 NOTE — Assessment & Plan Note (Signed)
Currently reports breathing stable.  No increased sob.  Continue current medication regimen.  Follow.

## 2019-02-08 NOTE — Assessment & Plan Note (Signed)
Has seen Dr Rockey Situ.  Continue risk factor modification.  Currently without symptoms.

## 2019-02-08 NOTE — Assessment & Plan Note (Signed)
Avoid antiinflammatories.  Follow renal function.

## 2019-02-08 NOTE — Assessment & Plan Note (Signed)
Stable on current regimen.  Follow.   

## 2019-02-08 NOTE — Assessment & Plan Note (Signed)
Encouraged to take her medications regularly.  Follow pressures.

## 2019-02-08 NOTE — Assessment & Plan Note (Signed)
Has been seen by vascular surgery previously.  Declines further testing at this time.

## 2019-02-08 NOTE — Assessment & Plan Note (Signed)
Abnormal CT chest/abdomen. Discussed with pt and her daughter.  Desires no further testing at this time.  Currently doing well.

## 2019-02-08 NOTE — Assessment & Plan Note (Signed)
Follow cbc.  

## 2019-02-11 DIAGNOSIS — I2510 Atherosclerotic heart disease of native coronary artery without angina pectoris: Secondary | ICD-10-CM | POA: Diagnosis not present

## 2019-02-11 DIAGNOSIS — G43909 Migraine, unspecified, not intractable, without status migrainosus: Secondary | ICD-10-CM | POA: Diagnosis not present

## 2019-02-11 DIAGNOSIS — Z905 Acquired absence of kidney: Secondary | ICD-10-CM | POA: Diagnosis not present

## 2019-02-11 DIAGNOSIS — J439 Emphysema, unspecified: Secondary | ICD-10-CM | POA: Diagnosis not present

## 2019-02-11 DIAGNOSIS — D509 Iron deficiency anemia, unspecified: Secondary | ICD-10-CM | POA: Diagnosis not present

## 2019-02-11 DIAGNOSIS — K5730 Diverticulosis of large intestine without perforation or abscess without bleeding: Secondary | ICD-10-CM | POA: Diagnosis not present

## 2019-02-11 DIAGNOSIS — K8020 Calculus of gallbladder without cholecystitis without obstruction: Secondary | ICD-10-CM | POA: Diagnosis not present

## 2019-02-11 DIAGNOSIS — I5023 Acute on chronic systolic (congestive) heart failure: Secondary | ICD-10-CM | POA: Diagnosis not present

## 2019-02-11 DIAGNOSIS — F331 Major depressive disorder, recurrent, moderate: Secondary | ICD-10-CM | POA: Diagnosis not present

## 2019-02-11 DIAGNOSIS — Z7982 Long term (current) use of aspirin: Secondary | ICD-10-CM | POA: Diagnosis not present

## 2019-02-11 DIAGNOSIS — F0390 Unspecified dementia without behavioral disturbance: Secondary | ICD-10-CM | POA: Diagnosis not present

## 2019-02-11 DIAGNOSIS — I4891 Unspecified atrial fibrillation: Secondary | ICD-10-CM | POA: Diagnosis not present

## 2019-02-11 DIAGNOSIS — F419 Anxiety disorder, unspecified: Secondary | ICD-10-CM | POA: Diagnosis not present

## 2019-02-11 DIAGNOSIS — N183 Chronic kidney disease, stage 3 (moderate): Secondary | ICD-10-CM | POA: Diagnosis not present

## 2019-02-11 DIAGNOSIS — E785 Hyperlipidemia, unspecified: Secondary | ICD-10-CM | POA: Diagnosis not present

## 2019-02-11 DIAGNOSIS — Z8553 Personal history of malignant neoplasm of renal pelvis: Secondary | ICD-10-CM | POA: Diagnosis not present

## 2019-02-11 DIAGNOSIS — M1990 Unspecified osteoarthritis, unspecified site: Secondary | ICD-10-CM | POA: Diagnosis not present

## 2019-02-11 DIAGNOSIS — Z955 Presence of coronary angioplasty implant and graft: Secondary | ICD-10-CM | POA: Diagnosis not present

## 2019-02-11 DIAGNOSIS — I255 Ischemic cardiomyopathy: Secondary | ICD-10-CM | POA: Diagnosis not present

## 2019-02-11 DIAGNOSIS — E213 Hyperparathyroidism, unspecified: Secondary | ICD-10-CM | POA: Diagnosis not present

## 2019-02-11 DIAGNOSIS — Z87890 Personal history of sex reassignment: Secondary | ICD-10-CM | POA: Diagnosis not present

## 2019-02-11 DIAGNOSIS — D259 Leiomyoma of uterus, unspecified: Secondary | ICD-10-CM | POA: Diagnosis not present

## 2019-02-11 DIAGNOSIS — I130 Hypertensive heart and chronic kidney disease with heart failure and stage 1 through stage 4 chronic kidney disease, or unspecified chronic kidney disease: Secondary | ICD-10-CM | POA: Diagnosis not present

## 2019-02-11 DIAGNOSIS — I252 Old myocardial infarction: Secondary | ICD-10-CM | POA: Diagnosis not present

## 2019-03-11 DIAGNOSIS — I63232 Cerebral infarction due to unspecified occlusion or stenosis of left carotid arteries: Secondary | ICD-10-CM | POA: Diagnosis not present

## 2019-03-11 DIAGNOSIS — E041 Nontoxic single thyroid nodule: Secondary | ICD-10-CM | POA: Diagnosis not present

## 2019-03-11 DIAGNOSIS — I69320 Aphasia following cerebral infarction: Secondary | ICD-10-CM | POA: Diagnosis not present

## 2019-03-11 DIAGNOSIS — I10 Essential (primary) hypertension: Secondary | ICD-10-CM | POA: Diagnosis not present

## 2019-03-11 DIAGNOSIS — Z9089 Acquired absence of other organs: Secondary | ICD-10-CM | POA: Diagnosis not present

## 2019-03-11 DIAGNOSIS — J90 Pleural effusion, not elsewhere classified: Secondary | ICD-10-CM | POA: Diagnosis not present

## 2019-03-11 DIAGNOSIS — Z20828 Contact with and (suspected) exposure to other viral communicable diseases: Secondary | ICD-10-CM | POA: Diagnosis not present

## 2019-03-11 DIAGNOSIS — J9811 Atelectasis: Secondary | ICD-10-CM | POA: Diagnosis not present

## 2019-03-11 DIAGNOSIS — I714 Abdominal aortic aneurysm, without rupture: Secondary | ICD-10-CM | POA: Diagnosis not present

## 2019-03-11 DIAGNOSIS — I252 Old myocardial infarction: Secondary | ICD-10-CM | POA: Diagnosis not present

## 2019-03-11 DIAGNOSIS — R29717 NIHSS score 17: Secondary | ICD-10-CM | POA: Diagnosis not present

## 2019-03-11 DIAGNOSIS — R918 Other nonspecific abnormal finding of lung field: Secondary | ICD-10-CM | POA: Diagnosis not present

## 2019-03-11 DIAGNOSIS — I639 Cerebral infarction, unspecified: Secondary | ICD-10-CM | POA: Diagnosis not present

## 2019-03-11 DIAGNOSIS — E7800 Pure hypercholesterolemia, unspecified: Secondary | ICD-10-CM | POA: Diagnosis not present

## 2019-03-11 DIAGNOSIS — R1314 Dysphagia, pharyngoesophageal phase: Secondary | ICD-10-CM | POA: Diagnosis not present

## 2019-03-11 DIAGNOSIS — I6523 Occlusion and stenosis of bilateral carotid arteries: Secondary | ICD-10-CM | POA: Diagnosis not present

## 2019-03-11 DIAGNOSIS — J698 Pneumonitis due to inhalation of other solids and liquids: Secondary | ICD-10-CM | POA: Diagnosis not present

## 2019-03-11 DIAGNOSIS — K589 Irritable bowel syndrome without diarrhea: Secondary | ICD-10-CM | POA: Diagnosis not present

## 2019-03-11 DIAGNOSIS — N183 Chronic kidney disease, stage 3 (moderate): Secondary | ICD-10-CM | POA: Diagnosis not present

## 2019-03-11 DIAGNOSIS — J439 Emphysema, unspecified: Secondary | ICD-10-CM | POA: Diagnosis not present

## 2019-03-11 DIAGNOSIS — G8191 Hemiplegia, unspecified affecting right dominant side: Secondary | ICD-10-CM | POA: Diagnosis not present

## 2019-03-11 DIAGNOSIS — F329 Major depressive disorder, single episode, unspecified: Secondary | ICD-10-CM | POA: Diagnosis not present

## 2019-03-11 DIAGNOSIS — G459 Transient cerebral ischemic attack, unspecified: Secondary | ICD-10-CM | POA: Diagnosis not present

## 2019-03-11 DIAGNOSIS — H538 Other visual disturbances: Secondary | ICD-10-CM | POA: Diagnosis not present

## 2019-03-11 DIAGNOSIS — Z85528 Personal history of other malignant neoplasm of kidney: Secondary | ICD-10-CM | POA: Diagnosis not present

## 2019-03-11 DIAGNOSIS — I517 Cardiomegaly: Secondary | ICD-10-CM | POA: Diagnosis not present

## 2019-03-11 DIAGNOSIS — I5020 Unspecified systolic (congestive) heart failure: Secondary | ICD-10-CM | POA: Diagnosis not present

## 2019-03-11 DIAGNOSIS — R4701 Aphasia: Secondary | ICD-10-CM | POA: Diagnosis not present

## 2019-03-11 DIAGNOSIS — I480 Paroxysmal atrial fibrillation: Secondary | ICD-10-CM | POA: Diagnosis not present

## 2019-03-11 DIAGNOSIS — R2990 Unspecified symptoms and signs involving the nervous system: Secondary | ICD-10-CM | POA: Diagnosis not present

## 2019-03-11 DIAGNOSIS — R471 Dysarthria and anarthria: Secondary | ICD-10-CM | POA: Diagnosis not present

## 2019-03-11 DIAGNOSIS — I130 Hypertensive heart and chronic kidney disease with heart failure and stage 1 through stage 4 chronic kidney disease, or unspecified chronic kidney disease: Secondary | ICD-10-CM | POA: Diagnosis not present

## 2019-03-11 DIAGNOSIS — R34 Anuria and oliguria: Secondary | ICD-10-CM | POA: Diagnosis not present

## 2019-03-11 DIAGNOSIS — E049 Nontoxic goiter, unspecified: Secondary | ICD-10-CM | POA: Diagnosis not present

## 2019-03-11 DIAGNOSIS — I6521 Occlusion and stenosis of right carotid artery: Secondary | ICD-10-CM | POA: Diagnosis not present

## 2019-03-11 DIAGNOSIS — I5022 Chronic systolic (congestive) heart failure: Secondary | ICD-10-CM | POA: Diagnosis not present

## 2019-03-11 DIAGNOSIS — Z8659 Personal history of other mental and behavioral disorders: Secondary | ICD-10-CM | POA: Diagnosis not present

## 2019-03-11 DIAGNOSIS — I771 Stricture of artery: Secondary | ICD-10-CM | POA: Diagnosis not present

## 2019-03-11 DIAGNOSIS — R29818 Other symptoms and signs involving the nervous system: Secondary | ICD-10-CM | POA: Diagnosis not present

## 2019-03-11 DIAGNOSIS — I63412 Cerebral infarction due to embolism of left middle cerebral artery: Secondary | ICD-10-CM | POA: Diagnosis not present

## 2019-03-11 DIAGNOSIS — R402364 Coma scale, best motor response, obeys commands, 24 hours or more after hospital admission: Secondary | ICD-10-CM | POA: Diagnosis not present

## 2019-03-11 DIAGNOSIS — R5381 Other malaise: Secondary | ICD-10-CM | POA: Diagnosis not present

## 2019-03-11 DIAGNOSIS — Z515 Encounter for palliative care: Secondary | ICD-10-CM | POA: Diagnosis not present

## 2019-03-11 DIAGNOSIS — R0682 Tachypnea, not elsewhere classified: Secondary | ICD-10-CM | POA: Diagnosis not present

## 2019-03-11 DIAGNOSIS — I69391 Dysphagia following cerebral infarction: Secondary | ICD-10-CM | POA: Diagnosis not present

## 2019-03-11 DIAGNOSIS — R402144 Coma scale, eyes open, spontaneous, 24 hours or more after hospital admission: Secondary | ICD-10-CM | POA: Diagnosis not present

## 2019-03-11 DIAGNOSIS — N189 Chronic kidney disease, unspecified: Secondary | ICD-10-CM | POA: Diagnosis not present

## 2019-03-11 DIAGNOSIS — E213 Hyperparathyroidism, unspecified: Secondary | ICD-10-CM | POA: Diagnosis not present

## 2019-03-11 DIAGNOSIS — Z87891 Personal history of nicotine dependence: Secondary | ICD-10-CM | POA: Diagnosis not present

## 2019-03-11 DIAGNOSIS — I63512 Cerebral infarction due to unspecified occlusion or stenosis of left middle cerebral artery: Secondary | ICD-10-CM | POA: Diagnosis not present

## 2019-03-11 DIAGNOSIS — Z8679 Personal history of other diseases of the circulatory system: Secondary | ICD-10-CM | POA: Diagnosis not present

## 2019-03-11 DIAGNOSIS — I2510 Atherosclerotic heart disease of native coronary artery without angina pectoris: Secondary | ICD-10-CM | POA: Diagnosis not present

## 2019-03-11 DIAGNOSIS — R479 Unspecified speech disturbances: Secondary | ICD-10-CM | POA: Diagnosis not present

## 2019-03-11 DIAGNOSIS — E871 Hypo-osmolality and hyponatremia: Secondary | ICD-10-CM | POA: Diagnosis not present

## 2019-03-11 DIAGNOSIS — Z96611 Presence of right artificial shoulder joint: Secondary | ICD-10-CM | POA: Diagnosis not present

## 2019-03-11 DIAGNOSIS — I708 Atherosclerosis of other arteries: Secondary | ICD-10-CM | POA: Diagnosis not present

## 2019-03-11 DIAGNOSIS — E785 Hyperlipidemia, unspecified: Secondary | ICD-10-CM | POA: Diagnosis not present

## 2019-03-19 MED ORDER — BISACODYL 10 MG RE SUPP
10.00 | RECTAL | Status: DC
Start: ? — End: 2019-03-19

## 2019-03-19 MED ORDER — FUROSEMIDE 10 MG/ML PO SOLN
20.00 | ORAL | Status: DC
Start: 2019-03-20 — End: 2019-03-19

## 2019-03-19 MED ORDER — CITALOPRAM HYDROBROMIDE 10 MG/5ML PO SOLN
20.00 | ORAL | Status: DC
Start: 2019-03-20 — End: 2019-03-19

## 2019-03-19 MED ORDER — DEXTROSE-NACL 5-0.9 % IV SOLN
100.00 | INTRAVENOUS | Status: DC
Start: ? — End: 2019-03-19

## 2019-03-19 MED ORDER — Medication
3.13 | Status: DC
Start: 2019-03-19 — End: 2019-03-19

## 2019-03-19 MED ORDER — POLYETHYLENE GLYCOL 3350 17 G PO PACK
17.00 | PACK | ORAL | Status: DC
Start: 2019-03-20 — End: 2019-03-19

## 2019-03-19 MED ORDER — GENERIC EXTERNAL MEDICATION
Status: DC
Start: ? — End: 2019-03-19

## 2019-03-19 MED ORDER — ACETAMINOPHEN 325 MG PO TABS
650.00 | ORAL_TABLET | ORAL | Status: DC
Start: ? — End: 2019-03-19

## 2019-03-19 MED ORDER — LABETALOL HCL 5 MG/ML IV SOLN
10.00 | INTRAVENOUS | Status: DC
Start: ? — End: 2019-03-19

## 2019-03-19 MED ORDER — HYDRALAZINE HCL 20 MG/ML IJ SOLN
10.00 | INTRAMUSCULAR | Status: DC
Start: ? — End: 2019-03-19

## 2019-03-26 ENCOUNTER — Telehealth: Payer: Self-pay

## 2019-03-26 NOTE — Telephone Encounter (Signed)
Received notification from [place] that patient passed away on 04/10/19. Changed status to deceased

## 2019-04-01 DEATH — deceased

## 2019-05-29 ENCOUNTER — Ambulatory Visit: Payer: Self-pay | Admitting: Internal Medicine

## 2020-04-24 IMAGING — CT CT HEAD WITHOUT CONTRAST
3 series · 15 of 46 positions shown, 18 images · non-contrast
Comparison: 12/11/2016 MR and prior studies

CLINICAL DATA: 84-year-old female with altered mental status.

EXAM:
CT HEAD WITHOUT CONTRAST
TECHNIQUE: Contiguous axial images were obtained from the base of the skull
through the vertex without intravenous contrast.

[Series 2: head wo · axial · 0.42mm/px · z∈[-97,+23]mm · 9 of 29 slices shown, 12 images]
[im 3/29  brain]
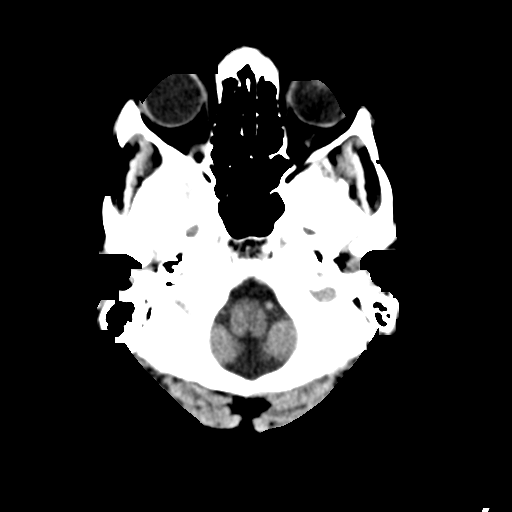
[im 3/29  bone]
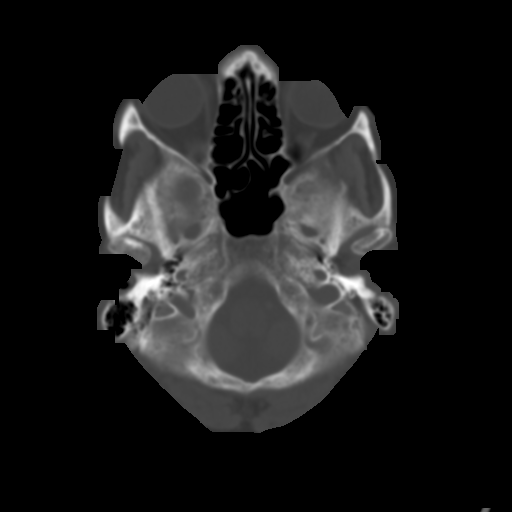
[im 6/29  brain]
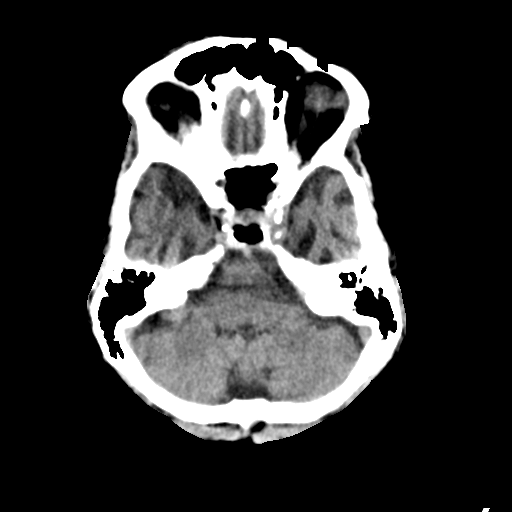
[im 9/29  brain]
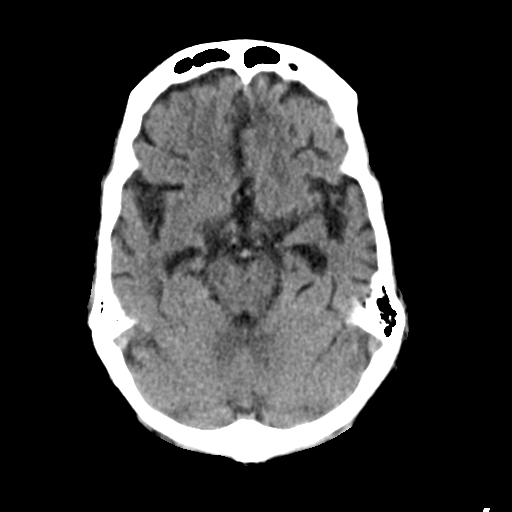
[im 12/29  brain]
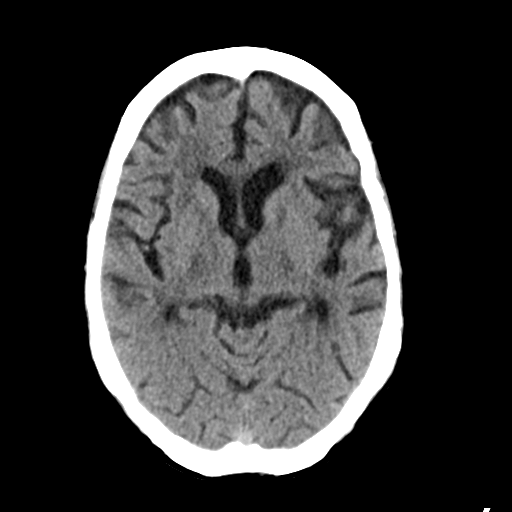
[im 15/29  brain]
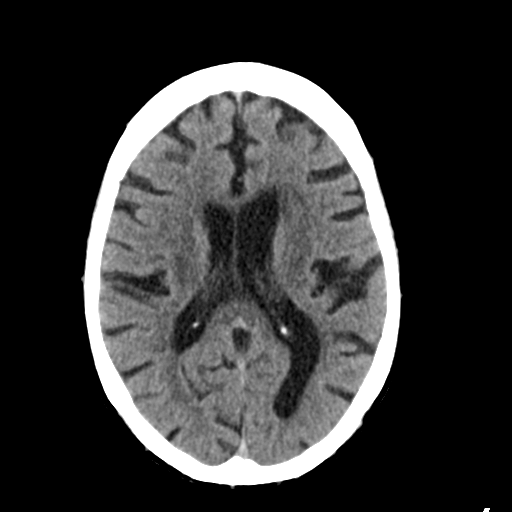
[im 15/29  bone]
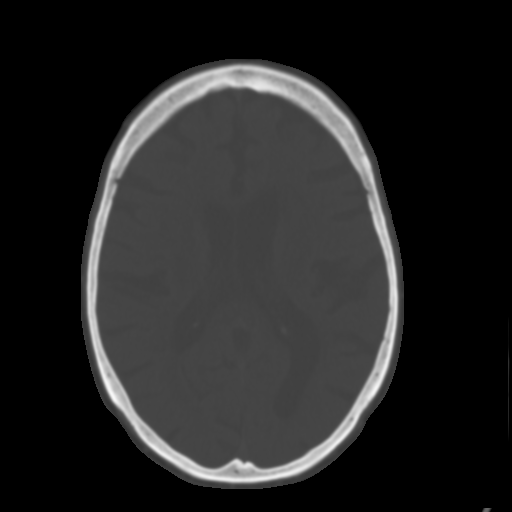
[im 18/29  brain]
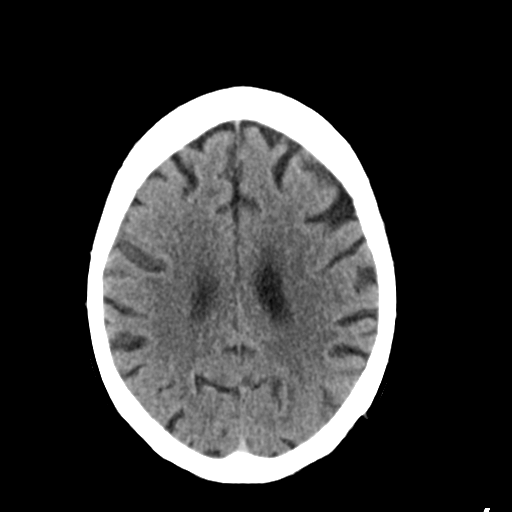
[im 21/29  brain]
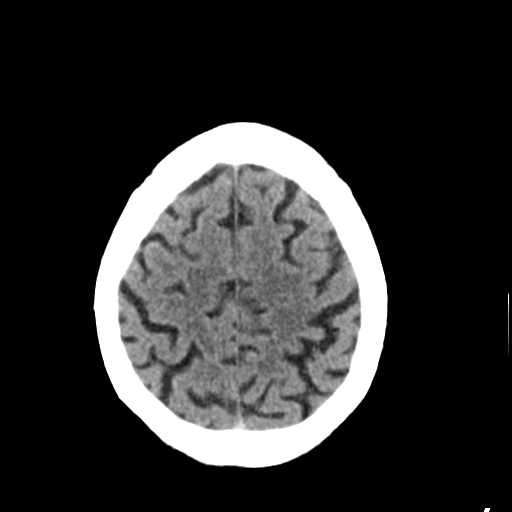
[im 24/29  brain]
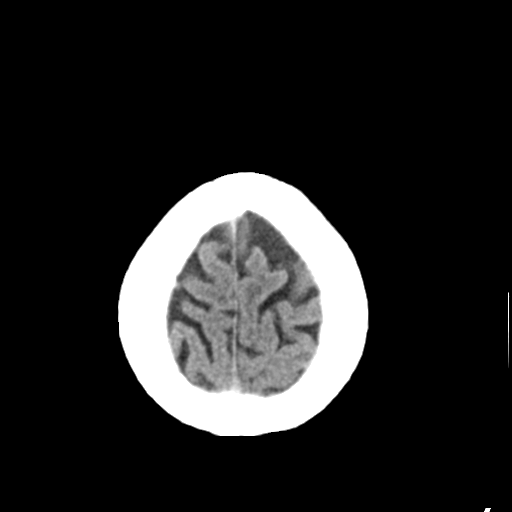
[im 27/29  brain]
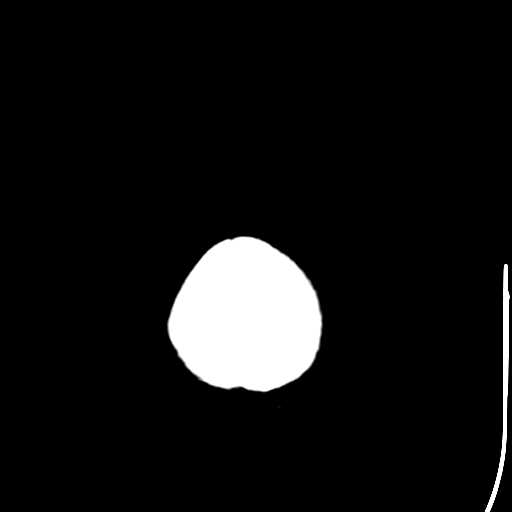
[im 27/29  bone]
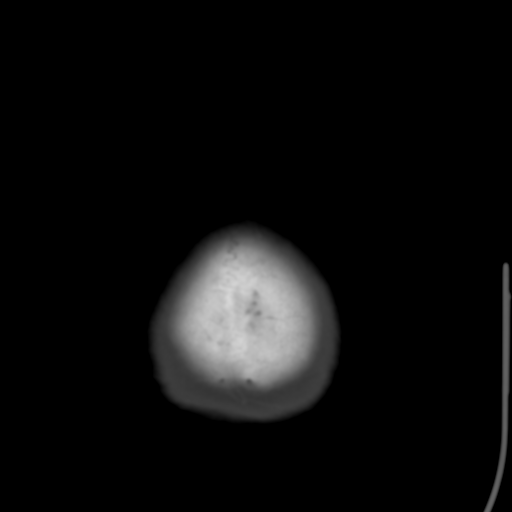

[Series 4: coronal soft tissue · coronal · 0.29mm/px · 3 of 61 slices shown]
[im 21/61  brain]
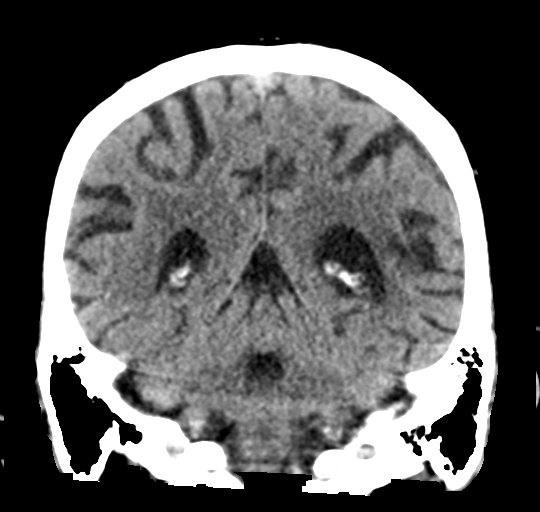
[im 27/61  brain]
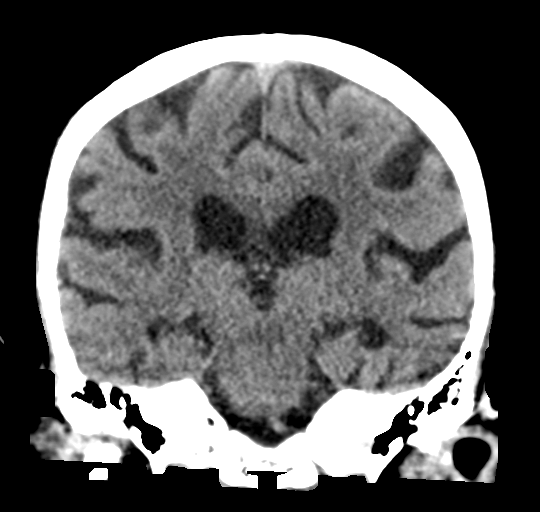
[im 34/61  brain]
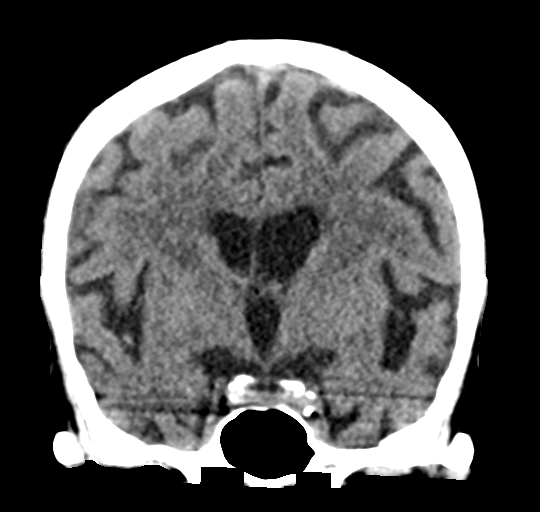

[Series 5: sagittal soft tissue · sagittal · 0.29mm/px · 3 of 47 slices shown]
[im 16/47  brain]
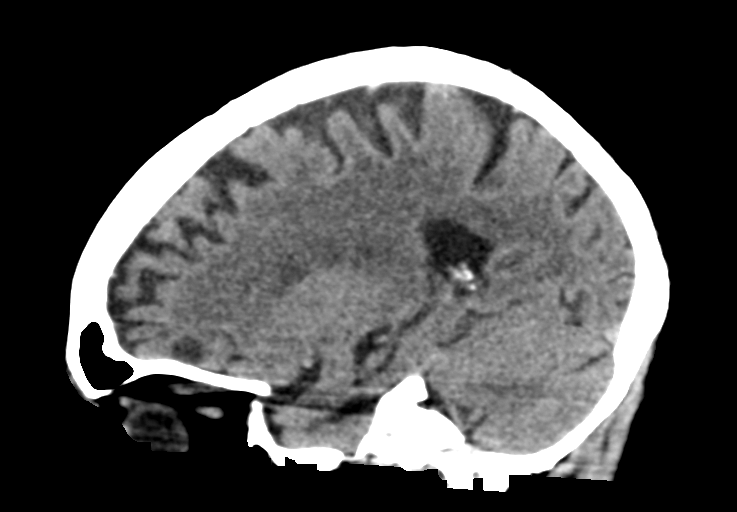
[im 24/47  brain]
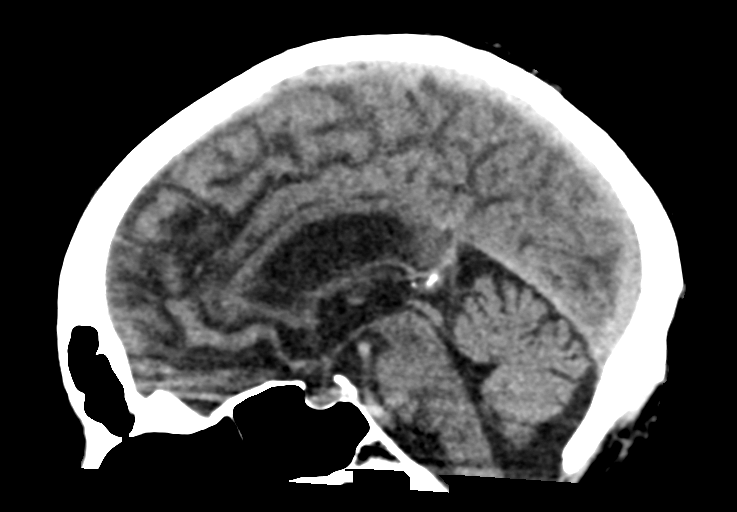
[im 31/47  brain]
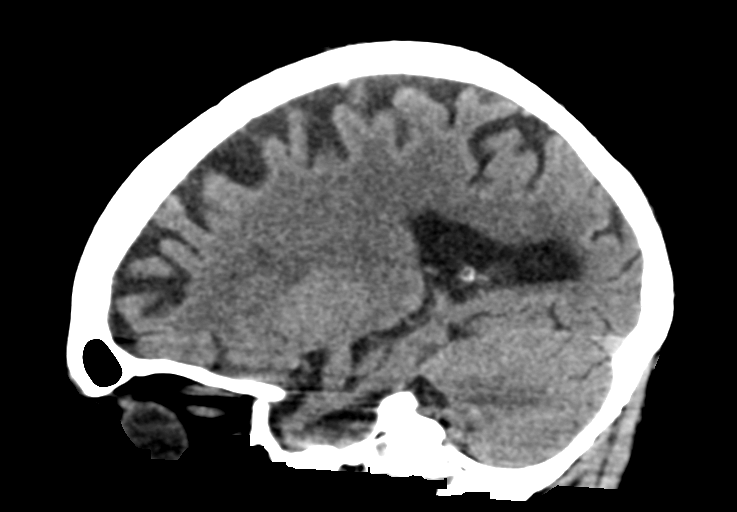

[15 of 46 positions shown; findings below may reference images not displayed]

FINDINGS: Brain: No evidence of acute infarction, hemorrhage, hydrocephalus,
extra-axial collection or mass lesion/mass effect.

Atrophy and chronic small-vessel white matter ischemic changes again
noted.

Vascular: Atherosclerotic calcifications noted.

Skull: Normal. Negative for fracture or focal lesion.

Sinuses/Orbits: No acute finding.

Other: None.
IMPRESSION: 1. No evidence of acute intracranial abnormality
2. Atrophy and chronic small-vessel white matter ischemic changes.

## 2020-05-05 IMAGING — US ULTRASOUND ABDOMEN LIMITED
1 series · 14 of 25 positions shown · non-contrast
Comparison: CT 12/10/2018

CLINICAL DATA: Cholelithiasis on CT.

EXAM:
ULTRASOUND ABDOMEN LIMITED RIGHT UPPER QUADRANT

[Series 1: ultrasound abdomen limited · 0.17mm/px · 14 of 42 slices shown]
[im 1/42]
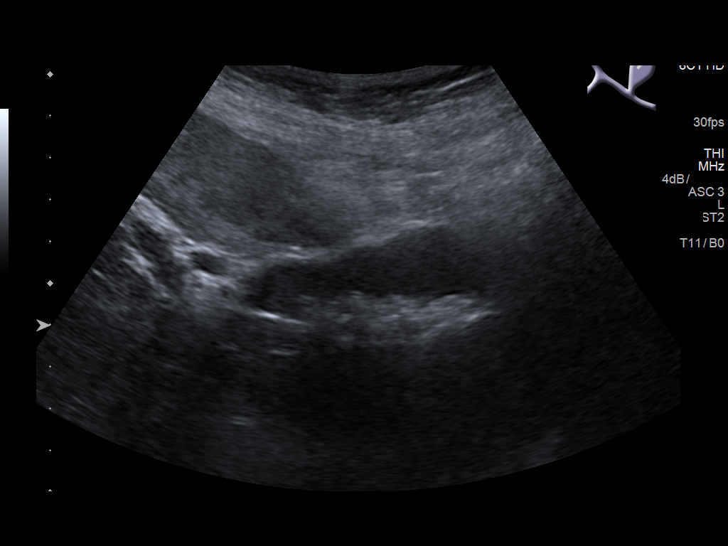
[im 4/42]
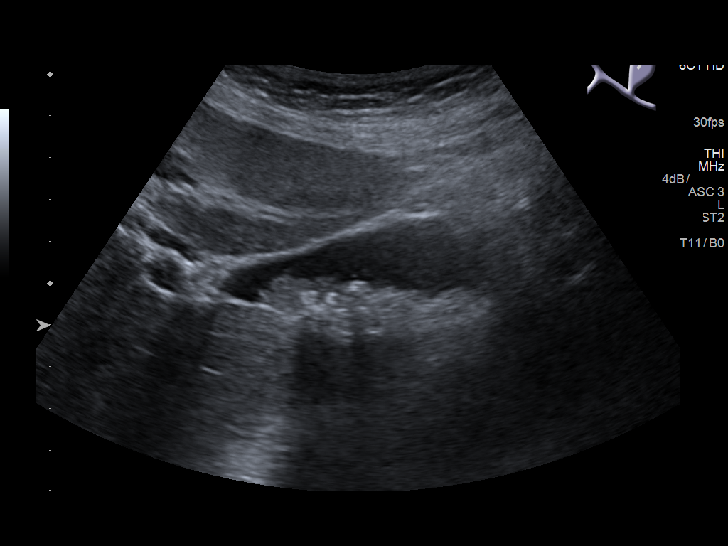
[im 7/42]
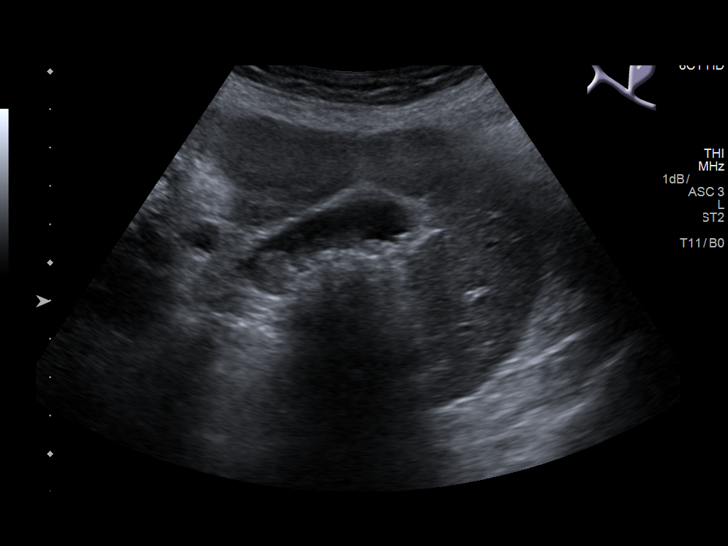
[im 11/42]
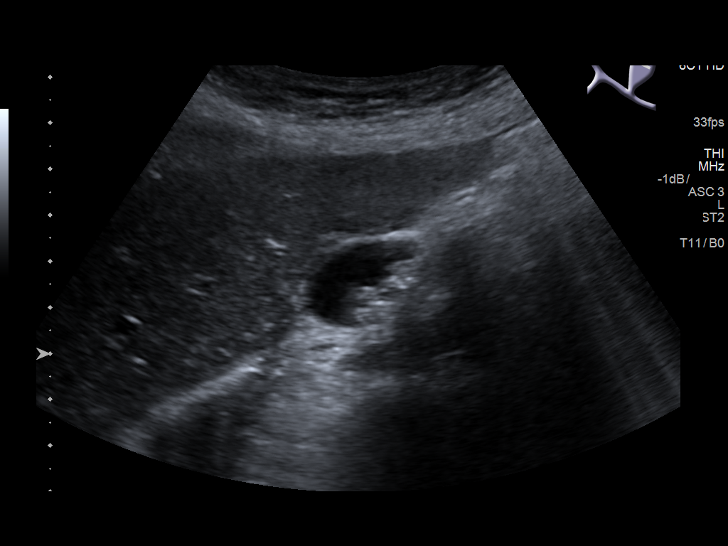
[im 14/42]
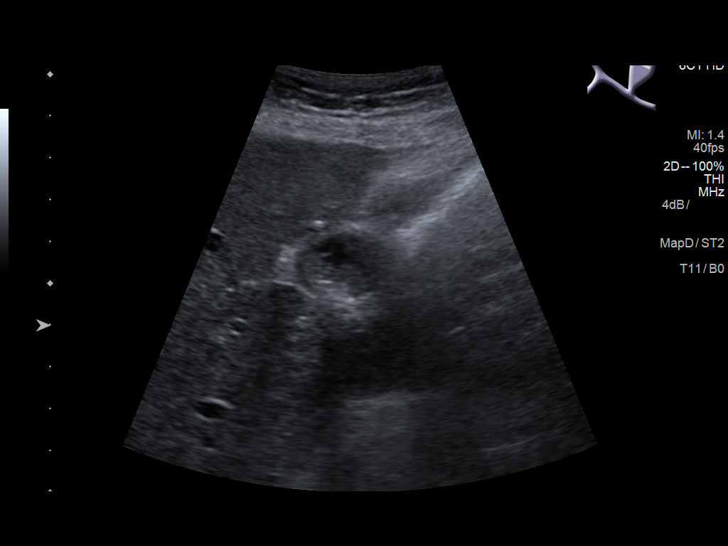
[im 16/42]
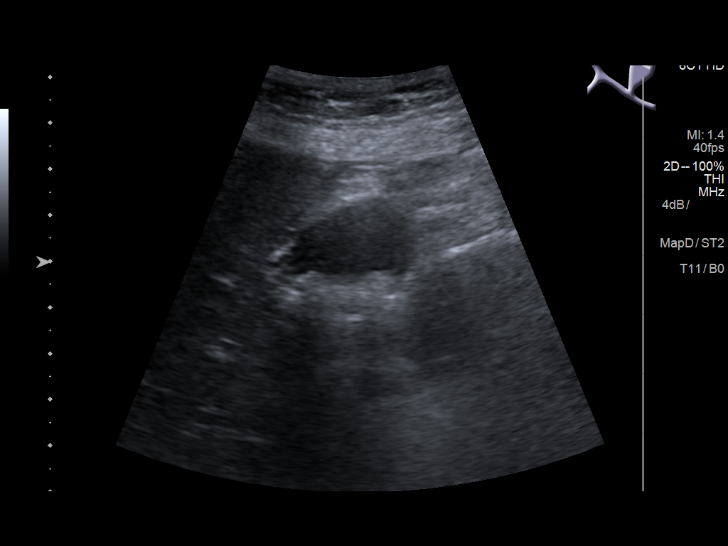
[im 19/42]
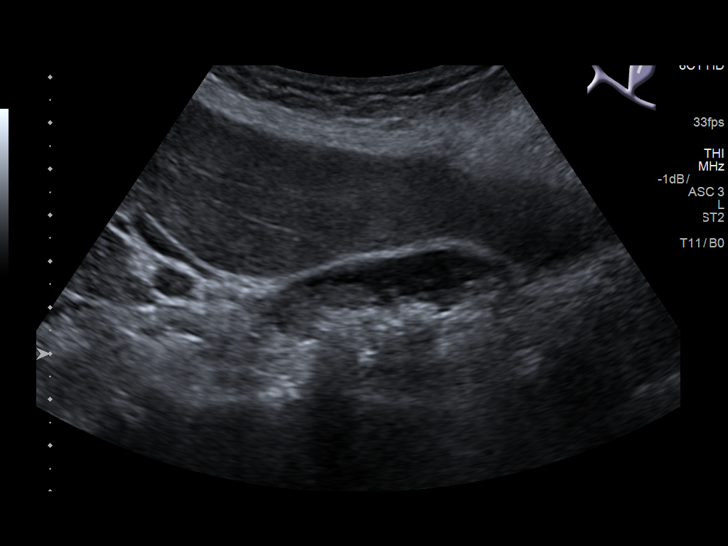
[im 23/42]
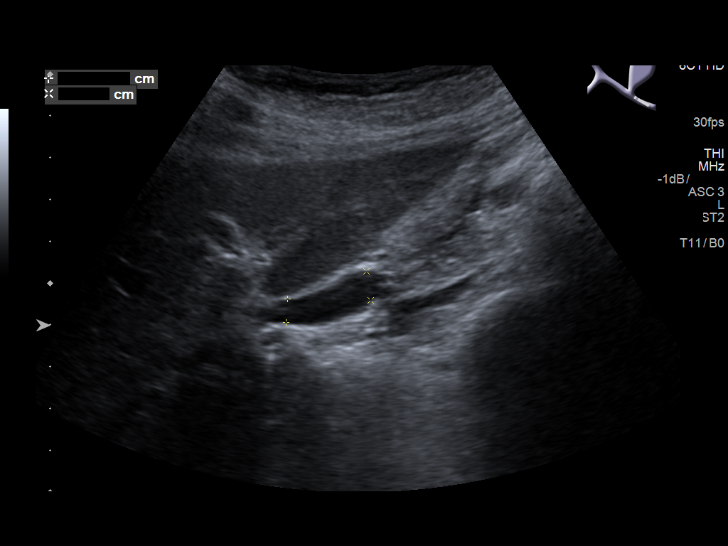
[im 26/42]
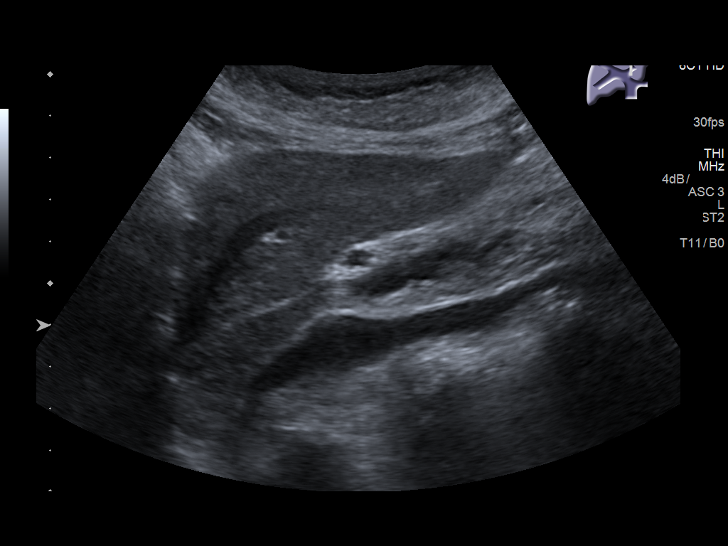
[im 28/42]
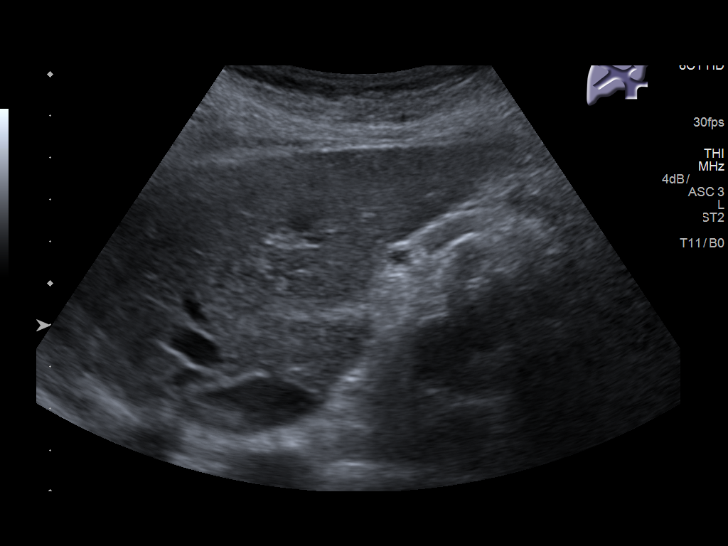
[im 31/42]
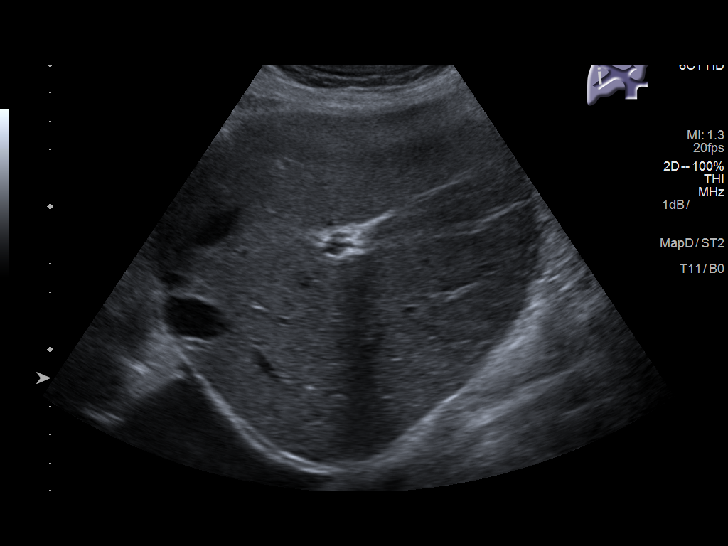
[im 35/42]
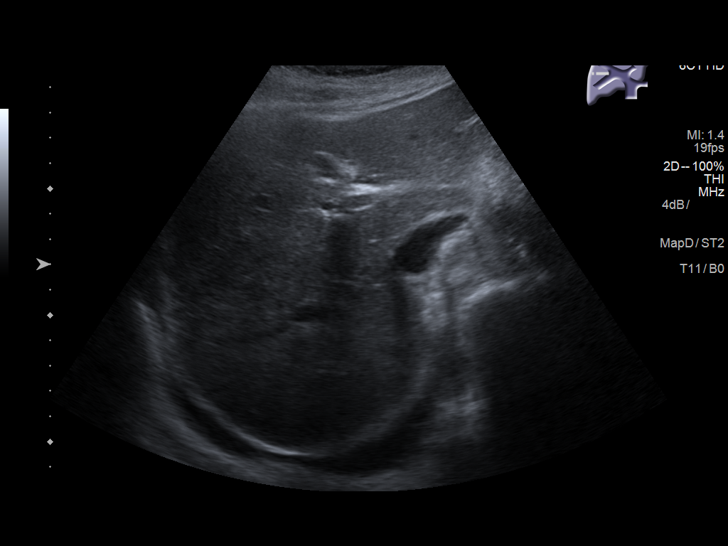
[im 38/42]
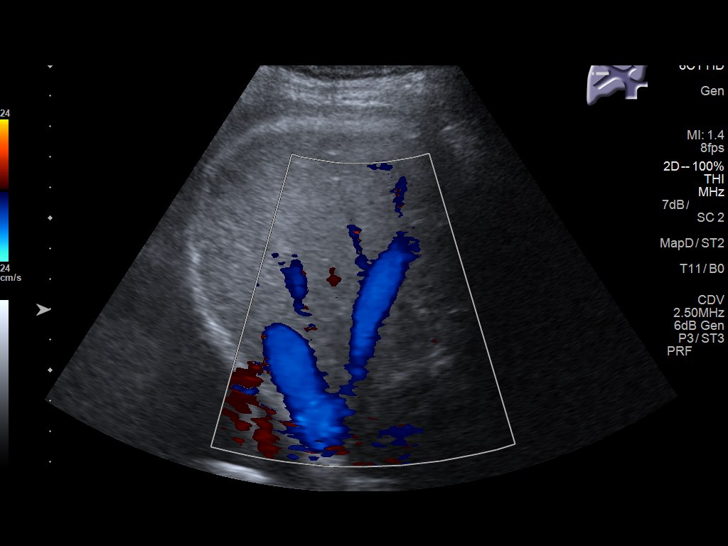
[im 42/42]
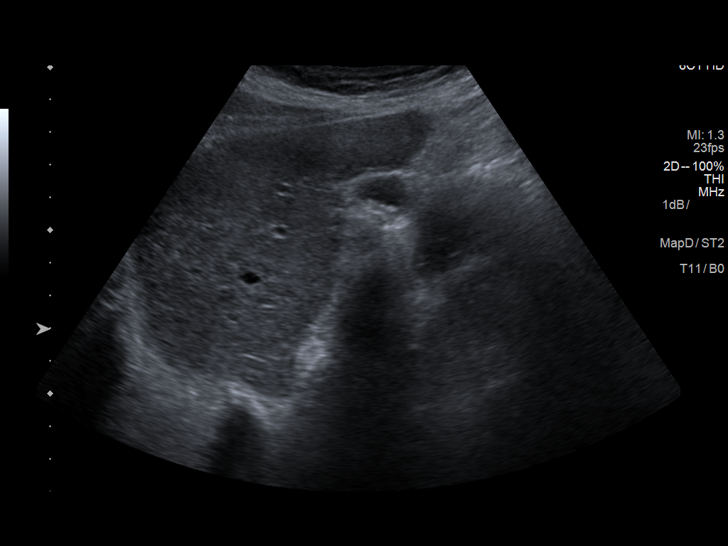

[14 of 25 positions shown; findings below may reference images not displayed]

FINDINGS: Gallbladder:

Multiple stones and sludge layering in the gallbladder. Largest
stones measure up to about 5 mm diameter. No gallbladder wall
thickening or edema. Murphy's sign is negative.

Common bile duct:

Diameter: 6 mm, normal

Liver:

No focal lesion identified. Within normal limits in parenchymal
echogenicity. Portal vein is patent on color Doppler imaging with
normal direction of blood flow towards the liver.

Incidental note of small right pleural effusion.
IMPRESSION: Cholelithiasis and gallbladder sludge without additional changes to
suggest cholecystitis. Right pleural effusion.
